# Patient Record
Sex: Female | Born: 1935 | Race: Black or African American | Hispanic: No | Marital: Single | Smoking: Former smoker
Health system: Southern US, Community
[De-identification: ages and names within clinical notes are randomized; demographics above are authoritative.]

## PROBLEM LIST (undated history)

## (undated) DIAGNOSIS — H269 Unspecified cataract: Secondary | ICD-10-CM

## (undated) DIAGNOSIS — N183 Chronic kidney disease, stage 3 unspecified: Secondary | ICD-10-CM

## (undated) DIAGNOSIS — E119 Type 2 diabetes mellitus without complications: Secondary | ICD-10-CM

## (undated) DIAGNOSIS — T8859XA Other complications of anesthesia, initial encounter: Secondary | ICD-10-CM

## (undated) DIAGNOSIS — I739 Peripheral vascular disease, unspecified: Secondary | ICD-10-CM

## (undated) DIAGNOSIS — T4145XA Adverse effect of unspecified anesthetic, initial encounter: Secondary | ICD-10-CM

## (undated) DIAGNOSIS — K589 Irritable bowel syndrome without diarrhea: Secondary | ICD-10-CM

## (undated) DIAGNOSIS — K469 Unspecified abdominal hernia without obstruction or gangrene: Secondary | ICD-10-CM

## (undated) DIAGNOSIS — M5030 Other cervical disc degeneration, unspecified cervical region: Secondary | ICD-10-CM

## (undated) DIAGNOSIS — R51 Headache: Secondary | ICD-10-CM

## (undated) DIAGNOSIS — Z86018 Personal history of other benign neoplasm: Secondary | ICD-10-CM

## (undated) DIAGNOSIS — I639 Cerebral infarction, unspecified: Secondary | ICD-10-CM

## (undated) DIAGNOSIS — R519 Headache, unspecified: Secondary | ICD-10-CM

## (undated) DIAGNOSIS — B029 Zoster without complications: Secondary | ICD-10-CM

## (undated) DIAGNOSIS — J4530 Mild persistent asthma, uncomplicated: Secondary | ICD-10-CM

## (undated) HISTORY — PX: TONSILLECTOMY: SUR1361

## (undated) HISTORY — PX: BYPASS GRAFT: SHX909

## (undated) HISTORY — PX: APPENDECTOMY: SHX54

## (undated) HISTORY — DX: Zoster without complications: B02.9

## (undated) HISTORY — DX: Headache: R51

## (undated) HISTORY — PX: PITUITARY EXCISION: SHX745

## (undated) HISTORY — DX: Peripheral vascular disease, unspecified: I73.9

## (undated) HISTORY — DX: Type 2 diabetes mellitus without complications: E11.9

## (undated) HISTORY — DX: Headache, unspecified: R51.9

---

## 2011-06-06 HISTORY — PX: ABOVE KNEE LEG AMPUTATION: SUR20

## 2014-09-16 ENCOUNTER — Encounter: Payer: Self-pay | Admitting: Neurology

## 2014-09-16 ENCOUNTER — Ambulatory Visit (INDEPENDENT_AMBULATORY_CARE_PROVIDER_SITE_OTHER): Payer: Medicare Other | Admitting: Neurology

## 2014-09-16 VITALS — BP 150/70 | HR 80 | Temp 98.3°F | Resp 20 | Ht 64.0 in | Wt 130.0 lb

## 2014-09-16 DIAGNOSIS — M79602 Pain in left arm: Secondary | ICD-10-CM | POA: Diagnosis not present

## 2014-09-16 DIAGNOSIS — E1142 Type 2 diabetes mellitus with diabetic polyneuropathy: Secondary | ICD-10-CM | POA: Diagnosis not present

## 2014-09-16 MED ORDER — GABAPENTIN 100 MG PO CAPS: ORAL_CAPSULE | ORAL | Status: DC

## 2014-09-16 NOTE — Progress Notes (Signed)
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  Records reviewed.  She is accompanied by her daughter who provides some history.  At the end of January, she felt like something bit her in the back of the neck.  Later, she began having a fluid-like sensation running down her left underarm to the elbow.  She then developed a mild nagging headache, involving either temple.  She then developed painful spasms in her underarm, as well as tingling sensation along the underarm and forearm.  There is no neck pain or pain radiating down from the neck.  The spasms are not triggered by neck movement.  It does not seem to be positional, although she notes some pain in the left elbow and some exacerbation of symptoms when leaning on the elbow.  She does not have headache at this point.  Medications are limited.  She cannot have NSAIDs.  She is very sensitive to medications.  Her Cymbalta was reduced due to hallucinations.  Tramadol is not effective and it causes some cognitive issues for her.  She takes gabapentin  twice daily.  PAST MEDICAL HISTORY: Past Medical History  Diagnosis Date  . Headache   . Diabetes mellitus without complication   . PVD (peripheral vascular disease)   . Shingles     PAST SURGICAL HISTORY: No past surgical history on file.  MEDICATIONS: No current outpatient prescriptions on file prior to visit.   No current facility-administered  medications on file prior to visit.    ALLERGIES: Allergies  Allergen Reactions  . Sulfur Anaphylaxis  . Ceclor [Cefaclor] Hives  . Plavix [Clopidogrel]   . Dilaudid [Hydromorphone Hcl] Rash  . Penicillins Rash    FAMILY HISTORY: Family History  Problem Relation Age of Onset  . Dementia    . Hypertension Daughter     SOCIAL HISTORY: History   Social History  . Marital Status: Unknown    Spouse Name: N/A  . Number of Children: N/A  . Years of Education: N/A   Occupational History  . Not on file.   Social History Main Topics  . Smoking status: Former Games developer  . Smokeless tobacco: Never Used  . Alcohol Use: No  . Drug Use: No  . Sexual Activity: No   Other Topics Concern  . Not on file   Social History Narrative  . No narrative on file    REVIEW OF SYSTEMS: Constitutional: No fevers, chills, or sweats, no generalized fatigue, change in appetite Eyes: No visual changes, double vision, eye pain Ear, nose and throat: No hearing loss, ear pain, nasal congestion, sore throat Cardiovascular: No chest pain, palpitations Respiratory:  No shortness of breath at rest or with exertion, wheezes GastrointestinaI: No nausea, vomiting, diarrhea, abdominal pain, fecal incontinence Genitourinary:  No dysuria, urinary retention or frequency Musculoskeletal:  No neck pain, back pain Integumentary: No rash, pruritus, skin lesions Neurological: as above Psychiatric: No depression, insomnia, anxiety Endocrine: No palpitations, fatigue, diaphoresis, mood swings, change in appetite, change in weight, increased thirst Hematologic/Lymphatic:  No anemia, purpura, petechiae. Allergic/Immunologic:  no itchy/runny eyes, nasal congestion, recent allergic reactions, rashes  PHYSICAL EXAM: Filed Vitals:   09/16/14 1227  BP: 150/70  Pulse: 80  Temp: 98.3 F (36.8 C)  Resp: 20   General: No acute distress Head:  Normocephalic/atraumatic Eyes:  fundi unremarkable, without vessel  changes, exudates, hemorrhages or papilledema. Neck: supple, no paraspinal tenderness, full range of motion Back: No paraspinal tenderness Heart: regular rate and rhythm Lungs: Clear to auscultation bilaterally. Vascular: No carotid bruits. Neurological Exam: Mental status: alert and oriented to person, place, and time, recent and remote memory intact, fund of knowledge intact, attention and concentration intact, speech fluent and not dysarthric, language intact. Cranial nerves: CN I: not tested CN II: pupils equal, round and reactive to light, visual fields intact, fundi unremarkable, without vessel changes, exudates, hemorrhages or papilledema. CN III, IV, VI:  full range of motion, no nystagmus, no ptosis CN V: facial sensation intact CN VII: upper and lower face symmetric CN VIII: hearing intact CN IX, X: gag intact, uvula midline CN XI: sternocleidomastoid and trapezius muscles intact CN XII: tongue midline Bulk & Tone: normal, no fasciculations.  Mild protrusion of the left shoulder blade.  Tenderness to palpation along the left underarm and forearm. Motor:  5/5 throughout Sensation:  Inconsistent reduced pinprick sensation in the fingertips of her left hand.  Reduced pinprick and vibration sensation in the left foot. Deep Tendon Reflexes:  2+ in upper extremities, absent in left lower extremity, left toes down (missing first digit) Finger to nose testing:  No dysmetria Heel to shin:  Unable to assess Gait:  Not ambulatory.  IMPRESSION: Left arm pain.  Cervical radiculopathy is possible but not definite based on clinical exam.  PLAN: Due to her age and her lack of concerning signs on exam (such as weakness), I would forego any diagnostic testing such as imaging or NCV and simply treat the pain.  Instead of adding another medication, which would likely cause drowsiness or other unwanted side effect, we will titrate gabapentin, which she already takes.  She will increase dose to  100mg  in morning and 200mg  at bedtime for 7 days, then 200mg  twice daily.  She will contact us with any questions or concerns and whether we need to continue titration.  She will follow up in 6 to 8 weeks.  45 minutes spent with patient, over 50% spent discussing diagnosis and management.  Thank you for allowing me to take part in the care of this patient.  Shon MilletAdam Sharronda Schweers, DO  CC:  Florentina JennyHenry Tripp, MD  Marlowe AschoffLatrice Polite, FNP

## 2014-09-16 NOTE — Patient Instructions (Signed)
Your strength in the arm is okay.  I would treat the pain due to the spasms in the arm.  We will increase the gabapentin 100mg  to 1 capsule in morning and 2 capsules at bedtime for 7 days, then increase to 2 capsules (200mg ) twice daily.  After a week, if pain not controlled, call us. Follow up in 6 to 8 weeks.

## 2014-09-22 ENCOUNTER — Ambulatory Visit: Payer: Self-pay | Admitting: Neurology

## 2014-10-12 ENCOUNTER — Telehealth: Payer: Self-pay | Admitting: *Deleted

## 2014-10-12 NOTE — Telephone Encounter (Signed)
Patients daughter called Charmaine Downs(Kathren) she states her mother has been very groggy with the Gabapentin is there something else she can do  Call back number [phone]

## 2014-10-12 NOTE — Telephone Encounter (Signed)
I spoke with patient daughter she is going to decrease her gabapentin  [facility] 1 [facility]  She was taking 2am and 2HS  She is just not able to stay awake during the day time . She did state the pain is so much better .

## 2014-10-19 NOTE — Telephone Encounter (Signed)
Can this be closed ?

## 2014-10-20 NOTE — Telephone Encounter (Signed)
I spoke with patient daughter she is going to decrease her gabapentin  To 1 in am and 2 at [facility]  She was taking 2am and 2HS  She is just not able to stay awake during the day time . She did state the pain is so much better .  

## 2014-11-13 ENCOUNTER — Ambulatory Visit (INDEPENDENT_AMBULATORY_CARE_PROVIDER_SITE_OTHER): Payer: Medicare Other | Admitting: Neurology

## 2014-11-13 ENCOUNTER — Encounter: Payer: Self-pay | Admitting: Neurology

## 2014-11-13 VITALS — BP 134/70 | HR 74 | Ht 64.0 in | Wt 130.0 lb

## 2014-11-13 DIAGNOSIS — G546 Phantom limb syndrome with pain: Secondary | ICD-10-CM | POA: Insufficient documentation

## 2014-11-13 DIAGNOSIS — M5010 Cervical disc disorder with radiculopathy, unspecified cervical region: Secondary | ICD-10-CM

## 2014-11-13 NOTE — Progress Notes (Signed)
NEUROLOGY FOLLOW UP OFFICE NOTE  [place] [id number]  HISTORY OF PRESENT ILLNESS: Carolyn Werner is a 81 year old right-handed woman with hyperlipidemia, hypertension, chronic kidney disease stage 3, irritable bowel syndrome,  depression, type 2 diabetes mellitus with diabetic neuropathy, right above the knee amputation with phantom limb pain, psoriasis, intermittent asthma, and osteoarthritis who follows up for left arm pain.  UPDATE: She is taking gabapentin 100mg  in morning and 200mg  at bedtime.  Taking 200mg  in the morning caused too much drowsiness.  Pain in the arm is improved but she still notes numbness.  Phantom limb pain is still an issue.  HISTORY: At the end of January, she felt like something bit her in the back of the neck.  Later, she began having a fluid-like sensation running down her left underarm to the elbow.  She then developed a mild nagging headache, involving either temple.  She then developed painful spasms in her underarm, as well as tingling sensation along the underarm and forearm.  There is no neck pain or pain radiating down from the neck.  The spasms are not triggered by neck movement.  It does not seem to be positional, although she notes some pain in the left elbow and some exacerbation of symptoms when leaning on the elbow.  She does not have headache at this point.  Medications are limited.  She cannot have NSAIDs.  She is very sensitive to medications.  Her Cymbalta was reduced due to hallucinations.  Tramadol is not effective and it causes some cognitive issues for her.  She takes gabapentin 100mg  twice daily.  PAST MEDICAL HISTORY: Past Medical History  Diagnosis Date  . Headache   . Diabetes mellitus without complication   . PVD (peripheral vascular disease)   . Shingles     MEDICATIONS: Current Outpatient Prescriptions on File Prior to Visit  Medication Sig Dispense Refill  . aspirin 325 MG tablet Take 325 mg by mouth daily.    Marland Kitchen  atenolol (TENORMIN) 25 MG tablet Take by mouth daily.    Marland Kitchen dicyclomine (BENTYL) 10 MG capsule Take 10 mg by mouth 4 (four) times daily -  before meals and at bedtime.    . docusate sodium (COLACE) 100 MG capsule Take 100 mg by mouth 2 (two) times daily.    . DULoxetine (CYMBALTA) 30 MG capsule Take 30 mg by mouth daily.    Marland Kitchen gabapentin (NEURONTIN) 100 MG capsule Increase to 1 capsule in AM and 2 capsules at bedtime x7days, then 2 capsules twice daily 120 capsule 0  . hydrALAZINE (APRESOLINE) 10 MG tablet Take 10 mg by mouth daily.    Marland Kitchen levalbuterol (XOPENEX HFA) 45 MCG/ACT inhaler Inhale 2 puffs into the lungs every 4 (four) hours as needed for wheezing.    Marland Kitchen lisinopril (PRINIVIL,ZESTRIL) 10 MG tablet Take 10 mg by mouth daily.    Marland Kitchen loratadine (CLARITIN) 10 MG tablet Take 10 mg by mouth daily.    . traMADol (ULTRAM) 50 MG tablet Take by mouth 2 (two) times daily.     No current facility-administered medications on file prior to visit.    ALLERGIES: Allergies  Allergen Reactions  . Sulfur Anaphylaxis  . Ceclor [Cefaclor] Hives  . Plavix [Clopidogrel]   . Dilaudid [Hydromorphone Hcl] Rash  . Penicillins Rash    FAMILY HISTORY: Family History  Problem Relation Age of Onset  . Dementia    . Hypertension Daughter     SOCIAL HISTORY: History   Social History  .  Marital Status: Unknown    Spouse Name: N/A  . Number of Children: N/A  . Years of Education: N/A   Occupational History  . Not on file.   Social History Main Topics  . Smoking status: Former Games developer  . Smokeless tobacco: Never Used  . Alcohol Use: No  . Drug Use: No  . Sexual Activity: No   Other Topics Concern  . Not on file   Social History Narrative    REVIEW OF SYSTEMS: Constitutional: No fevers, chills, or sweats, no generalized fatigue, change in appetite Eyes: No visual changes, double vision, eye pain Ear, nose and throat: No hearing loss, ear pain, nasal congestion, sore throat Cardiovascular: No  chest pain, palpitations Respiratory:  No shortness of breath at rest or with exertion, wheezes GastrointestinaI: No nausea, vomiting, diarrhea, abdominal pain, fecal incontinence Genitourinary:  No dysuria, urinary retention or frequency Musculoskeletal:  No neck pain, back pain Integumentary: No rash, pruritus, skin lesions Neurological: as above Psychiatric: No depression, insomnia, anxiety Endocrine: No palpitations, fatigue, diaphoresis, mood swings, change in appetite, change in weight, increased thirst Hematologic/Lymphatic:  No anemia, purpura, petechiae. Allergic/Immunologic: no itchy/runny eyes, nasal congestion, recent allergic reactions, rashes  PHYSICAL EXAM: Filed Vitals:   11/13/14 1527  BP: 134/70  Pulse: 74   General: No acute distress Head:  Normocephalic/atraumatic Eyes:  Fundoscopic exam unremarkable without vessel changes, exudates, hemorrhages or papilledema. Neck: supple, no paraspinal tenderness, full range of motion Heart:  Regular rate and rhythm Lungs:  Clear to auscultation bilaterally Back: No paraspinal tenderness Neurological Exam: alert and oriented to person, place, and time. Attention span and concentration intact, recent and remote memory intact, fund of knowledge intact.  Speech fluent and not dysarthric, language intact.  CN II-XII intact. Fundoscopic exam unremarkable without vessel changes, exudates, hemorrhages or papilledema.  Bulk and tone normal, muscle strength 5/5 throughout.  Sensation to light touch intact.  2+ in upper extremities, absent in left lower extremity.  Finger to nose and heel to shin testing intact.  Non-ambulatory.  IMPRESSION: Left arm pain.  Cervical radiculopathy is possible  Phantom limb pain  PLAN: Continue gabapentin  in morning and  at bedtime Call us if you need referral for physical therapy for phantom limb pain Follow up in 5 months  15 minutes spent face to face with patient and daughter, over 50%  spent discussing management.  Shon Millet, DO  CC: Florentina Jenny, MD Marlowe Aschoff, FNP

## 2014-11-13 NOTE — Patient Instructions (Signed)
Continue gabapentin 100mg  in morning and 200mg  at bedtime Call us if you need referral for physical therapy for phantom limb pain Follow up in 5 months

## 2014-11-16 ENCOUNTER — Telehealth: Payer: Self-pay | Admitting: Neurology

## 2014-11-16 NOTE — Telephone Encounter (Signed)
I explained to this patient I was not the nurse who worked her up on her last visit she was upset that her medications were not updated . I advised her that on her next we would do that .

## 2014-11-16 NOTE — Telephone Encounter (Signed)
Pt called in regards to her updated medication list/Dawn CB# [phone]

## 2015-04-16 ENCOUNTER — Encounter: Payer: Self-pay | Admitting: Neurology

## 2015-04-16 ENCOUNTER — Ambulatory Visit (INDEPENDENT_AMBULATORY_CARE_PROVIDER_SITE_OTHER): Payer: Medicare Other | Admitting: Neurology

## 2015-04-16 VITALS — BP 134/50 | HR 67 | Ht 64.0 in

## 2015-04-16 DIAGNOSIS — G546 Phantom limb syndrome with pain: Secondary | ICD-10-CM | POA: Diagnosis not present

## 2015-04-16 DIAGNOSIS — M5010 Cervical disc disorder with radiculopathy, unspecified cervical region: Secondary | ICD-10-CM

## 2015-04-16 NOTE — Patient Instructions (Signed)
Continue gabapentin 100mg  in morning and 200mg  at night Continue home PT/OT exercises Follow up in 6 months.

## 2015-04-16 NOTE — Progress Notes (Signed)
Chart forwarded.  

## 2015-04-16 NOTE — Progress Notes (Signed)
NEUROLOGY FOLLOW UP OFFICE NOTE  [name] [id number]  HISTORY OF PRESENT ILLNESS: Carolyn Werner is a 81 year old right-handed woman with hyperlipidemia, hypertension, chronic kidney disease stage 3, irritable bowel syndrome,  depression, type 2 diabetes mellitus with diabetic neuropathy, right above the knee amputation with phantom limb pain, psoriasis, intermittent asthma, and osteoarthritis who follows up for left arm pain.  UPDATE: She is taking gabapentin 100mg  in morning and 200mg  at bedtime.  She just finished PT/OT which has helped with the arm pain.  Now, she really only notices some numbness in the arm.  The phantom limb pain is still an issue but she learned exercises for that too.  HISTORY: At the end of January, she felt like something bit her in the back of the neck.  Later, she began having a fluid-like sensation running down her left underarm to the elbow.  She then developed a mild nagging headache, involving either temple.  She then developed painful spasms in her underarm, as well as tingling sensation along the underarm and forearm.  There is no neck pain or pain radiating down from the neck.  The spasms are not triggered by neck movement.  It does not seem to be positional, although she notes some pain in the left elbow and some exacerbation of symptoms when leaning on the elbow.  She does not have headache at this point.    Medications are limited.  She cannot have NSAIDs.  She is very sensitive to medications.  Her Cymbalta was reduced due to hallucinations.  Tramadol is not effective and it causes some cognitive issues for her.    PAST MEDICAL HISTORY: Past Medical History  Diagnosis Date  . Headache   . Diabetes mellitus without complication ([name])   . PVD (peripheral vascular disease) ([name])   . Shingles     MEDICATIONS: Current Outpatient Prescriptions on File Prior to Visit  Medication Sig Dispense Refill  . aspirin 325 MG tablet Take 325 mg by  mouth daily.    Marland Kitchen. docusate sodium (COLACE) 100 MG capsule Take 100 mg by mouth 2 (two) times daily.    . DULoxetine (CYMBALTA) 30 MG capsule Take 30 mg by mouth daily.    Marland Kitchen. gabapentin (NEURONTIN) 100 MG capsule Increase to 1 capsule in AM and 2 capsules at bedtime x7days, then 2 capsules twice daily 120 capsule 0  . levalbuterol (XOPENEX HFA) 45 MCG/ACT inhaler Inhale 2 puffs into the lungs every 4 (four) hours as needed for wheezing.    Marland Kitchen. loratadine (CLARITIN) 10 MG tablet Take 10 mg by mouth daily.    . traMADol (ULTRAM) 50 MG tablet Take by mouth 2 (two) times daily.     No current facility-administered medications on file prior to visit.    ALLERGIES: Allergies  Allergen Reactions  . Sulfur Anaphylaxis  . Ceclor [Cefaclor] Hives  . Plavix [Clopidogrel]   . Dilaudid [Hydromorphone Hcl] Rash  . Penicillins Rash    FAMILY HISTORY: Family History  Problem Relation Age of Onset  . Dementia    . Hypertension Daughter     SOCIAL HISTORY: Social History   Social History  . Marital Status: Unknown    Spouse Name: N/A  . Number of Children: N/A  . Years of Education: N/A   Occupational History  . Not on file.   Social History Main Topics  . Smoking status: Former Games developermoker  . Smokeless tobacco: Never Used  . Alcohol Use: No  . Drug Use: No  .  Sexual Activity: No   Other Topics Concern  . Not on file   Social History Narrative    REVIEW OF SYSTEMS: Constitutional: No fevers, chills, or sweats, no generalized fatigue, change in appetite Eyes: No visual changes, double vision, eye pain Ear, nose and throat: No hearing loss, ear pain, nasal congestion, sore throat Cardiovascular: No chest pain, palpitations Respiratory:  No shortness of breath at rest or with exertion, wheezes GastrointestinaI: No nausea, vomiting, diarrhea, abdominal pain, fecal incontinence Genitourinary:  No dysuria, urinary retention or frequency Musculoskeletal:  No neck pain, back  pain Integumentary: No rash, pruritus, skin lesions Neurological: as above Psychiatric: No depression, insomnia, anxiety Endocrine: No palpitations, fatigue, diaphoresis, mood swings, change in appetite, change in weight, increased thirst Hematologic/Lymphatic:  No anemia, purpura, petechiae. Allergic/Immunologic: no itchy/runny eyes, nasal congestion, recent allergic reactions, rashes  PHYSICAL EXAM: Filed Vitals:   04/16/15 1413  BP: 134/50  Pulse: 67   General: No acute distress.  Patient appears well-groomed.   Head:  Normocephalic/atraumatic Eyes:  Fundoscopic exam unremarkable without vessel changes, exudates, hemorrhages or papilledema. Neck: supple, no paraspinal tenderness, full range of motion Heart:  Regular rate and rhythm Lungs:  Clear to auscultation bilaterally Back: No paraspinal tenderness Neurological Exam: alert and oriented to person, place, and time. Attention span and concentration intact, recent and remote memory intact, fund of knowledge intact.  Speech fluent and not dysarthric, language intact.  CN II-XII intact. Fundoscopic exam unremarkable without vessel changes, exudates, hemorrhages or papilledema.  Bulk and tone normal, muscle strength 5/5 throughout.  Sensation to light touch intact.  2+ in upper extremities, absent in left lower extremity.  Finger to nose and heel to shin testing intact.  Non-ambulatory.  IMPRESSION: Left arm pain.  Cervical radiculopathy.  Improved Phantom limb pain  PLAN: Continue gabapentin  in AM and  in PM and Cymbalta  Continue home PT/OT exercises Follow up in 6 months.  15 minutes spent face to face with patient, over 50% spent discussing management.   Shon Millet, DO  CC:  Lynn Ito

## 2015-10-15 ENCOUNTER — Ambulatory Visit: Payer: Federal, State, Local not specified - PPO | Admitting: Neurology

## 2016-01-19 ENCOUNTER — Ambulatory Visit: Payer: Federal, State, Local not specified - PPO | Admitting: Neurology

## 2016-07-03 ENCOUNTER — Encounter (HOSPITAL_COMMUNITY): Payer: Self-pay | Admitting: Pharmacy Technician

## 2016-07-03 ENCOUNTER — Emergency Department (HOSPITAL_COMMUNITY)
Admission: EM | Admit: 2016-07-03 | Discharge: 2016-07-03 | Disposition: A | Discharge status: Home / Self Care | Payer: Medicare Other | Attending: Emergency Medicine | Admitting: Emergency Medicine

## 2016-07-03 ENCOUNTER — Emergency Department (HOSPITAL_COMMUNITY): Payer: Medicare Other

## 2016-07-03 DIAGNOSIS — Z87891 Personal history of nicotine dependence: Secondary | ICD-10-CM | POA: Insufficient documentation

## 2016-07-03 DIAGNOSIS — E119 Type 2 diabetes mellitus without complications: Secondary | ICD-10-CM | POA: Insufficient documentation

## 2016-07-03 DIAGNOSIS — Z79899 Other long term (current) drug therapy: Secondary | ICD-10-CM | POA: Insufficient documentation

## 2016-07-03 DIAGNOSIS — R42 Dizziness and giddiness: Secondary | ICD-10-CM | POA: Diagnosis not present

## 2016-07-03 DIAGNOSIS — Z7982 Long term (current) use of aspirin: Secondary | ICD-10-CM | POA: Insufficient documentation

## 2016-07-03 DIAGNOSIS — R55 Syncope and collapse: Secondary | ICD-10-CM | POA: Diagnosis present

## 2016-07-03 LAB — BASIC METABOLIC PANEL
Anion gap: 9 (ref 5–15)
BUN: 14 mg/dL (ref 6–20)
CHLORIDE: 101 mmol/L (ref 101–111)
CO2: 26 mmol/L (ref 22–32)
CREATININE: 1.16 mg/dL — AB (ref 0.44–1.00)
Calcium: 10.1 mg/dL (ref 8.9–10.3)
GFR calc Af Amer: 46 mL/min — ABNORMAL LOW (ref >60)
GFR calc non Af Amer: 40 mL/min — ABNORMAL LOW (ref >60)
Glucose, Bld: 174 mg/dL — ABNORMAL HIGH (ref 65–99)
POTASSIUM: 3.8 mmol/L (ref 3.5–5.1)
SODIUM: 136 mmol/L (ref 135–145)

## 2016-07-03 LAB — URINALYSIS, ROUTINE W REFLEX MICROSCOPIC
BACTERIA UA: NONE SEEN
Bilirubin Urine: NEGATIVE
Glucose, UA: NEGATIVE mg/dL
Hgb urine dipstick: NEGATIVE
Ketones, ur: NEGATIVE mg/dL
Nitrite: NEGATIVE
PROTEIN: NEGATIVE mg/dL
SPECIFIC GRAVITY, URINE: 1.009 (ref 1.005–1.030)
SQUAMOUS EPITHELIAL / LPF: NONE SEEN
pH: 5 (ref 5.0–8.0)

## 2016-07-03 LAB — CBC WITH DIFFERENTIAL/PLATELET
BASOS ABS: 0 10*3/uL (ref 0.0–0.1)
Basophils Relative: 0 %
EOS PCT: 1 %
Eosinophils Absolute: 0.1 10*3/uL (ref 0.0–0.7)
HEMATOCRIT: 39 % (ref 36.0–46.0)
Hemoglobin: 13 g/dL (ref 12.0–15.0)
LYMPHS PCT: 28 %
Lymphs Abs: 1.8 10*3/uL (ref 0.7–4.0)
MCH: 29.2 pg (ref 26.0–34.0)
MCHC: 33.3 g/dL (ref 30.0–36.0)
MCV: 87.6 fL (ref 78.0–100.0)
MONOS PCT: 5 %
Monocytes Absolute: 0.4 10*3/uL (ref 0.1–1.0)
NEUTROS ABS: 4.2 10*3/uL (ref 1.7–7.7)
Neutrophils Relative %: 66 %
PLATELETS: 145 10*3/uL — AB (ref 150–400)
RBC: 4.45 MIL/uL (ref 3.87–5.11)
RDW: 12.4 % (ref 11.5–15.5)
WBC: 6.5 10*3/uL (ref 4.0–10.5)

## 2016-07-03 LAB — I-STAT TROPONIN, ED: Troponin i, poc: 0.02 ng/mL (ref 0.00–0.08)

## 2016-07-03 NOTE — ED Provider Notes (Signed)
[facility]PT Provider Note   CSN: [id number] Arrival date & time: 07/03/16  1602     History   Chief Complaint Chief Complaint  Patient presents with  . Near Syncope    HPI Carolyn Werner is a 83 y.o. female.  83 year old African-American female with a past medical history significant for hyperlipidemia, hypertension, CK D stage III, IBS, depression, diabetes, right AKA, asthma, PAD that presents to the ED today with complaint of near syncope. Patient states that she was washing when she became lightheaded and felt like the room was spining. The patient was able to sit down. She denies LOC. She denies any pain at the time. The patient states that she has had vertigo in the past and this felt different. Patient uses a wheelchair at home. She lives with her daughter. Patient also reports having palpitations that of an ongoing for the past 2 years. Last episode of palpitations was last week. She reports associated nausea with the episode. She reported substernal chest "burning" with the episodes of palpitations. She denies any shortness of breath or diaphoresis with the episode. The pain self resolved. She denies any pain today. Patient does report a mild left-sided headache. She denies any vision changes. Patient states she has been seen by cardiologist for palpitations. Patient denies any dizziness or lightheadedness at this time. States that her symptoms have resolved. She denies any fever, chills, headache, vision changes, chest pain, shortness breath, abdominal pain, nausea, emesis, urinary symptoms, paresthesias.          Past Medical History:  Diagnosis Date  . Diabetes mellitus without complication ([name])   . Headache   . PVD (peripheral vascular disease) ([name])   . Shingles     Patient Active Problem List   Diagnosis Date Noted  . Phantom limb pain ([name]) 11/13/2014  . Cervical disc disorder with radiculopathy of cervical region 11/13/2014    History reviewed.  No pertinent surgical history.  OB History    No data available       Home Medications    Prior to Admission medications   Medication Sig Start Date End Date Taking? Authorizing Provider  amLODipine (NORVASC) 2.5 MG tablet Take 2.5 mg by mouth daily.    Historical Provider, MD  aspirin 325 MG tablet Take 325 mg by mouth daily.    Historical Provider, MD  docusate sodium (COLACE) 100 MG capsule Take 100 mg by mouth 2 (two) times daily.    Historical Provider, MD  DULoxetine (CYMBALTA) 30 MG capsule Take 30 mg by mouth daily.    Historical Provider, MD  gabapentin (NEURONTIN) 100 MG capsule Increase to 1 capsule in AM and 2 capsules at bedtime x7days, then 2 capsules twice daily 09/16/14   Drema Dallas, DO  levalbuterol [facility] HFA) 45 MCG/ACT inhaler Inhale 2 puffs into the lungs every 4 (four) hours as needed for wheezing.    Historical Provider, MD  loratadine (CLARITIN) 10 MG tablet Take 10 mg by mouth daily.    Historical Provider, MD  metoprolol tartrate (LOPRESSOR) 25 MG tablet Take 25 mg by mouth 2 (two) times daily.    Historical Provider, MD  traMADol (ULTRAM) 50 MG tablet Take by mouth 2 (two) times daily.    Historical Provider, MD    Family History Family History  Problem Relation Age of Onset  . Dementia    . Hypertension Daughter     Social History Social History  Substance Use Topics  . Smoking status: Former Games developer  .  Smokeless tobacco: Never Used  . Alcohol use No     Allergies   Sulfur; Ceclor [cefaclor]; Plavix [clopidogrel]; Dilaudid [hydromorphone hcl]; and Penicillins   Review of Systems Review of Systems  Constitutional: Negative for chills and fever.  HENT: Negative for congestion.   Eyes: Negative for visual disturbance.  Respiratory: Negative for cough and shortness of breath.   Cardiovascular: Negative for chest pain and palpitations.  Gastrointestinal: Negative for diarrhea, nausea and vomiting.  Genitourinary: Negative for dysuria,  frequency, hematuria and urgency.  Musculoskeletal: Negative for back pain.  Skin: Negative.   Neurological: Positive for dizziness, weakness, light-headedness and headaches.  All other systems reviewed and are negative.    Physical Exam Updated Vital Signs There were no vitals taken for this visit.  Physical Exam  Constitutional: She is oriented to person, place, and time. She appears well-developed and well-nourished. No distress.  HENT:  Head: Normocephalic and atraumatic.  Right Ear: Tympanic membrane, external ear and ear canal normal.  Left Ear: Tympanic membrane, external ear and ear canal normal.  Nose: Nose normal.  Mouth/Throat: Uvula is midline and oropharynx is clear and moist.  Eyes: Conjunctivae and EOM are normal. Pupils are equal, round, and reactive to light. Right eye exhibits no discharge. Left eye exhibits no discharge. No scleral icterus.  Neck: Normal range of motion. Neck supple. No thyromegaly present.  Cardiovascular: Normal rate, regular rhythm, normal heart sounds and intact distal pulses.  Exam reveals no gallop and no friction rub.   No murmur heard. Pulmonary/Chest: Effort normal and breath sounds normal. No respiratory distress. She has no wheezes. She exhibits no tenderness.  Abdominal: Soft. Bowel sounds are normal. She exhibits no distension. There is no tenderness. There is no rebound and no guarding.  Musculoskeletal: Normal range of motion.  Patient with above-the-knee amputation on the right. Pulses are 2+ and all x-rays bilaterally.  Lymphadenopathy:    She has no cervical adenopathy.  Neurological: She is alert and oriented to person, place, and time.  The patient is alert, attentive, and oriented x 3. Speech is clear. Cranial nerve II-VII grossly intact. Negative pronator drift. Sensation intact. Strength 5/5 in all extremities. Reflexes 2+ and symmetric at biceps, triceps, knees, and ankles. Rapid alternating movement and fine finger movements  intact. Did not assess gait or Romberg due to weakness.  Skin: Skin is warm and dry. Capillary refill takes less than 2 seconds.  Nursing note and vitals reviewed.    ED Treatments / Results  Labs (all labs ordered are listed, but only abnormal results are displayed) Labs Reviewed  BASIC METABOLIC PANEL - Abnormal; Notable for the following:       Result Value   Glucose, Bld 174 (*)    Creatinine, Ser 1.16 (*)    GFR calc non Af Amer 40 (*)    GFR calc Af Amer 46 (*)    All other components within normal limits  CBC WITH DIFFERENTIAL/PLATELET - Abnormal; Notable for the following:    Platelets 145 (*)    All other components within normal limits  URINALYSIS, ROUTINE W REFLEX MICROSCOPIC - Abnormal; Notable for the following:    Leukocytes, UA MODERATE (*)    All other components within normal limits  I-STAT TROPOININ, ED    EKG  EKG Interpretation  Date/Time:  Monday July 03 2016 17:08:07 EST Ventricular Rate:  64 PR Interval:    QRS Duration: 97 QT Interval:  423 QTC Calculation: 437 R Axis:   13 Text  Interpretation:  Sinus rhythm Abnormal R-wave progression, early transition Nonspecific T abnormalities, inferior leads No significant change since last tracing Confirmed by Ethelda ChickJACUBOWITZ  MD, SAM [phone]([id number]) on 07/03/2016 5:21:08 PM       Radiology Dg Chest 2 View  Result Date: 07/03/2016 CLINICAL DATA:  Acute onset of vertigo and generalized weakness. Initial encounter. EXAM: CHEST  2 VIEW COMPARISON:  None. FINDINGS: The lungs are well-aerated and clear. There is no evidence of focal opacification, pleural effusion or pneumothorax. The heart is normal in size; the mediastinal contour is within normal limits. No acute osseous abnormalities are seen. IMPRESSION: No acute cardiopulmonary process seen. Electronically Signed   By: Roanna RaiderJeffery  Chang M.D.   On: 07/03/2016 19:32    Procedures Procedures (including critical care time)  Medications Ordered in ED Medications - No  data to display   Initial Impression / Assessment and Plan / ED Course  I have reviewed the triage vital signs and the nursing notes.  Pertinent labs & imaging results that were available during my care of the patient were reviewed by me and considered in my medical decision making (see chart for details).     Patient presents to the ED with complaint of dizziness and feeling generally weak onset prior to arrival. Patient denies any syncope. Denies any LOC. States her symptoms have completely resolved. Patient describes the dizziness as the sensation of the room was spinning. Sitting down resolved her symptoms. She has no focal neuro deficits. Patient does report palpitations that have been ongoing. Has been seen by cardiologist. Denies any chest pain or shortness of breath episode today. Chest x-ray reveals no acute abnormalities. No focal infiltrate. Troponin is negative. EKG without any acute changes. Labs are reassuring. No signs of urinary tract infection. Patient continues to be asymptomatic. She does not appear to be dehydrated. Spoke with tried hospitalist. Felt the patient needed admission for observation to rule out near-syncope. Hospitalist felt that patient would not benefit from admission to the hospital. All of her lab work has been reassuring. Felt she could be discharged home. Symptoms are likely consistent with vertigo. She's been symptomatically free in the ED. No focal neuro deficits concerning for CVA. Spoke with Dr. Rennis ChrisJacobowitz who evaluated patient and agrees with the above plan. Patient vital signs are stable. She is nontoxic appearing. She is in no acute distress. Patient lives at home with her daughter. Ambulate with wheelchair. Encouraged follow-up with her primary care doctor. Daughter is agreeable to the above plan. Pt is hemodynamically stable, in NAD, & able to ambulate in the ED. Pain has been managed & has no complaints prior to dc. Pt is comfortable with above plan and is  stable for discharge at this time. All questions were answered prior to disposition. Strict return precautions for f/u to the ED were discussed.   Final Clinical Impressions(s) / ED Diagnoses   Final diagnoses:  Dizziness    New Prescriptions New Prescriptions   No medications on file     Rise MuKenneth T Leaphart, PA-C 07/05/16 0207    Doug SouSam Jacubowitz, MD 07/06/16 [phone]

## 2016-07-03 NOTE — ED Provider Notes (Signed)
Patient reports she felt room spinning and generally weak earlier today which has since resolved. She is presently asymptomatic on exam she is awake alert Glasgow Coma Score 15 creatinine is 2 through 12 grossly intact. No distress   Doug SouSam Fabiha Rougeau, MD 07/03/16 [phone]

## 2016-07-03 NOTE — ED Notes (Signed)
ED Provider at bedside. 

## 2016-07-03 NOTE — ED Triage Notes (Signed)
Pt arrives to the ED via [facility] with reports of near syncope. Per EMS pt was washing dishes and became dizzy and lightheaded. Pt was able to sit down. Pt did not lose consciousness. Pt also c/o palpitations and nausea X 2days managed by pcp. VSS with EMS.

## 2016-07-03 NOTE — Discharge Instructions (Signed)
All of your workup has been normal today. This could be possible vertigo. Please follow-up with her primary care doctor. I'm also giving a referral to the cardiologist as you asked for. Please return to the ED if your symptoms worsen.

## 2016-07-05 ENCOUNTER — Ambulatory Visit (INDEPENDENT_AMBULATORY_CARE_PROVIDER_SITE_OTHER): Payer: Medicare Other | Admitting: Cardiology

## 2016-07-05 ENCOUNTER — Encounter: Payer: Self-pay | Admitting: Cardiology

## 2016-07-05 VITALS — BP 134/68 | HR 72 | Ht 63.5 in

## 2016-07-05 DIAGNOSIS — R002 Palpitations: Secondary | ICD-10-CM | POA: Diagnosis not present

## 2016-07-05 DIAGNOSIS — R011 Cardiac murmur, unspecified: Secondary | ICD-10-CM | POA: Diagnosis not present

## 2016-07-05 MED ORDER — METOPROLOL TARTRATE 25 MG PO TABS: 25.0000 mg | ORAL_TABLET | Freq: Two times a day (BID) | ORAL | 4 refills | Status: DC

## 2016-07-05 NOTE — Patient Instructions (Addendum)
Medication Instructions:  INCREASE Metoprolol 25mg  to Twice a day   Labwork: None   Testing/Procedures: Your physician has requested that you have an echocardiogram. Echocardiography is a painless test that uses sound waves to create images of your heart. It provides your doctor with information about the size and shape of your heart and how well your heart's chambers and valves are working. This procedure takes approximately one hour. There are no restrictions for this procedure.  Your physician has recommended that you wear an 30 day event monitor. Event monitors are medical devices that record the heart's electrical activity. Doctors most often us these monitors to diagnose arrhythmias. Arrhythmias are problems with the speed or rhythm of the heartbeat. The monitor is a small, portable device. You can wear one while you do your normal daily activities. This is usually used to diagnose what is causing palpitations/syncope (passing out).  BOTH WILL BE COMPLETE AT [address]  Follow-Up: Your physician recommends that you schedule a follow-up appointment in: 1 [facility].  Any Other Special Instructions Will Be Listed Below (If Applicable).   If you need a refill on your cardiac medications before your next appointment, please call your pharmacy.

## 2016-07-05 NOTE — Progress Notes (Signed)
07/05/2016 Zane HeraldJuantia Kloss   07/20/1921  [id number]  Primary Physician Lynn ItoSmith, Lori, MD Primary Cardiologist: New (Dr. Duke Salviaandolph, DOD)   Reason for Visit/CC: Palpitations   HPI:  [name] is a 83 y/o AAF, retired Doctor, general practicemilitary RN, who presents to clinic today as a new patient. [facility] given development of paroxsymal  tachy palpitations. She has no significant prior cardiac history. She reports that she had a LCH while living in [place], almost 10 years ago, due to an abnormal ETT that was reportedly normal. She does have cardiac risk factors including DM, HTN and stage 3 CKD. She also has PVD, s/p right AKA in 2013. She is now wheel chair bound but looks very well for her age. Good cognitive function. She lives with her daughter. She is a little hard of hearing and wears two hearing aids. [facility] [place] [facility] [place].   She notes increased recurrence of palpitations over the last 2 months. Occurs at random. Episodes last up to 5 min at a time. Feels fast and irregular. She reports associated chest discomfort that feels like a burning in her chest when this occurs, but she denies any CP in the absence of palpitations. She sometimes feels short of breath when this happens but she denies syncope/ near syncope. She was drinking regular coffee each morning but has recently changed to decaf. She is on Lopressor, but only taking this once daily.   [facility] for dizziness, but denied any symptoms of palpitations or chest pain. W/u in the ED was normal, including vital signs, EKG and laboratory work. CBC and CMP was normal. It was felt that her symptoms were related to vertigo. She was however advised to f/u in our office given her recent palpitations.   She is asymptomatic in clinic today. Exam is benign except for a slight cardiac murmur at RUSB. RRR. BP is stable. She comes to clinic [facility]. TSH was WNL.     Current Meds  Medication Sig  . amLODipine (NORVASC) 2.5 MG tablet Take 2.5 mg by mouth every evening.   Marland Kitchen. aspirin 325 MG tablet Take 325 mg by mouth daily.  . Biotin 5 MG TABS Take 5 mg by mouth every evening.   . budesonide (PULMICORT) 0.25 MG/2ML nebulizer solution Take 0.25 mg by nebulization 2 (two) times daily.  . Cholecalciferol (VITAMIN D) 2000 units CAPS Take 2,000 Units by mouth daily.  Marland Kitchen. docusate sodium (COLACE) 100 MG capsule Take 200 mg by mouth at bedtime.   . DULoxetine (CYMBALTA) 30 MG capsule Take 30 mg by mouth daily.  . Flaxseed, Linseed, (FLAXSEED OIL) 1000 MG CAPS Take 1,000 mg by mouth 3 (three) times daily.  . fluticasone (FLONASE) 50 MCG/ACT nasal spray Place 1 spray into both nostrils daily as needed for allergies or rhinitis.  Marland Kitchen. gabapentin (NEURONTIN) 100 MG capsule Increase to 1 capsule in AM and 2 capsules at bedtime x7days, then 2 capsules twice daily (Patient taking differently: Take 100-200 mg by mouth See admin instructions. Takes 1 capsule in AM and 2 capsules at bedtime)  . latanoprost (XALATAN) 0.005 % ophthalmic solution Place 1 drop into both eyes at bedtime.  . levalbuterol (XOPENEX HFA) 45 MCG/ACT inhaler Inhale 2 puffs into the lungs every 4 (four) hours as needed for wheezing.  Marland Kitchen. loratadine (CLARITIN) 10 MG tablet Take 10 mg by mouth  daily as needed for allergies.  . metoprolol tartrate (LOPRESSOR) 25 MG tablet Take 1 tablet (25 mg total) by mouth 2 (two) times daily.  . Multiple Vitamin (MULTIVITAMIN WITH MINERALS) TABS tablet Take 1 tablet by mouth daily.  Marland Kitchen nystatin ointment (MYCOSTATIN) Apply 1 application topically daily as needed (for irritation).  Bertram Gala Glycol-Propyl Glycol (SYSTANE) 0.4-0.3 % SOLN Apply 1 drop to eye 2 (two) times daily as needed (for dry eyes).  . sodium chloride (OCEAN) 0.65 % SOLN nasal spray Place 1 spray into both nostrils daily as needed for congestion.  . traMADol (ULTRAM) 50 MG tablet  Take 50 mg by mouth every 12 (twelve) hours as needed for moderate pain.   . [DISCONTINUED] metoprolol tartrate (LOPRESSOR) 25 MG tablet Take 25 mg by mouth daily.    Allergies  Allergen Reactions  . Sulfa Antibiotics Anaphylaxis  . Ceclor [Cefaclor] Hives  . Dilaudid [Hydromorphone Hcl] Rash  . Penicillins Rash  . Plavix [Clopidogrel] Other (See Comments)   Past Medical History:  Diagnosis Date  . Diabetes mellitus without complication ([name])   . Headache   . PVD (peripheral vascular disease) ([name])   . Shingles    Family History  Problem Relation Age of Onset  . Dementia    . Hypertension Daughter    No past surgical history on file. Social History   Social History  . Marital status: Unknown    Spouse name: N/A  . Number of children: N/A  . Years of education: N/A   Occupational History  . Not on file.   Social History Main Topics  . Smoking status: Former Games developer  . Smokeless tobacco: Never Used  . Alcohol use No  . Drug use: No  . Sexual activity: No   Other Topics Concern  . Not on file   Social History Narrative  . No narrative on file     Review of Systems: General: negative for chills, fever, night sweats or weight changes.  Cardiovascular: negative for chest pain, dyspnea on exertion, edema, orthopnea, palpitations, paroxysmal nocturnal dyspnea or shortness of breath Dermatological: negative for rash Respiratory: negative for cough or wheezing Urologic: negative for hematuria Abdominal: negative for nausea, vomiting, diarrhea, bright red blood per rectum, melena, or hematemesis Neurologic: negative for visual changes, syncope, or dizziness All other systems reviewed and are otherwise negative except as noted above.   Physical Exam:  Blood pressure 134/68, pulse 72, height 5' 3.5" (1.613 m).  General appearance: alert, cooperative and no distress Neck: no carotid bruit and no JVD Lungs: clear to auscultation bilaterally Heart: regular rate and  rhythm and 1/6 systolic, high pitched murmur at RUSB Extremities: s/p right AKA, no edema on the left  Pulses: 2+ and symmetric Skin: Skin color, texture, turgor normal. No rashes or lesions Neurologic: Grossly normal  EKG NSR. 07/03/16  ASSESSMENT AND PLAN:   1. Palpitations: currently asymptomatic in clinic today. Physical exam reveals RRR. Recent labs at [facility] 1/29 showed normal K and renal function, normal CBC. UA also normal. TSH was recently checked at the [facility] 05/2016 and was WNL. We will order a 30 day monitor to r/o afib and other worrisome arrhthymias. We will also increase the frequency of her metoprolol. She is on the short acting, Lopressor, but only taking once daily. We will increase to BID. We will also check a 2D echo to screen for structural abnormalities.   2. Cardiac Murmur: 1/6 high pitched systolic murmur noted along RUSB. We will obtain a  2D echo to further investigate.   This patient case was discussed with Dr. Duke Salvia, DOD, who agrees with the above plan.   PLAN  F/u with Dr. Duke Salvia, in 4 weeks, after monitor.   Robbie Lis PA-C 07/05/2016 3:39 PM

## 2016-07-13 ENCOUNTER — Ambulatory Visit (INDEPENDENT_AMBULATORY_CARE_PROVIDER_SITE_OTHER): Payer: Medicare Other

## 2016-07-13 DIAGNOSIS — R002 Palpitations: Secondary | ICD-10-CM | POA: Diagnosis not present

## 2016-07-13 DIAGNOSIS — R011 Cardiac murmur, unspecified: Secondary | ICD-10-CM | POA: Diagnosis not present

## 2016-07-19 ENCOUNTER — Ambulatory Visit (HOSPITAL_COMMUNITY): Payer: Federal, State, Local not specified - PPO

## 2016-07-27 ENCOUNTER — Encounter (HOSPITAL_COMMUNITY): Payer: Self-pay

## 2016-07-27 ENCOUNTER — Emergency Department (HOSPITAL_COMMUNITY): Payer: Medicare Other

## 2016-07-27 ENCOUNTER — Observation Stay (HOSPITAL_COMMUNITY): Payer: Medicare Other

## 2016-07-27 ENCOUNTER — Inpatient Hospital Stay (HOSPITAL_COMMUNITY)
Admission: EM | Admit: 2016-07-27 | Discharge: 2016-07-31 | DRG: 066 | Disposition: A | Discharge status: Skilled Nursing Facility | Payer: Medicare Other | Attending: Oncology | Admitting: Oncology

## 2016-07-27 DIAGNOSIS — M792 Neuralgia and neuritis, unspecified: Secondary | ICD-10-CM

## 2016-07-27 DIAGNOSIS — Z79899 Other long term (current) drug therapy: Secondary | ICD-10-CM

## 2016-07-27 DIAGNOSIS — E785 Hyperlipidemia, unspecified: Secondary | ICD-10-CM | POA: Diagnosis present

## 2016-07-27 DIAGNOSIS — R29898 Other symptoms and signs involving the musculoskeletal system: Secondary | ICD-10-CM

## 2016-07-27 DIAGNOSIS — R200 Anesthesia of skin: Secondary | ICD-10-CM

## 2016-07-27 DIAGNOSIS — R471 Dysarthria and anarthria: Secondary | ICD-10-CM | POA: Diagnosis not present

## 2016-07-27 DIAGNOSIS — F32A Depression, unspecified: Secondary | ICD-10-CM

## 2016-07-27 DIAGNOSIS — I639 Cerebral infarction, unspecified: Secondary | ICD-10-CM | POA: Diagnosis not present

## 2016-07-27 DIAGNOSIS — R1310 Dysphagia, unspecified: Secondary | ICD-10-CM | POA: Diagnosis present

## 2016-07-27 DIAGNOSIS — R4702 Dysphasia: Secondary | ICD-10-CM

## 2016-07-27 DIAGNOSIS — E1142 Type 2 diabetes mellitus with diabetic polyneuropathy: Secondary | ICD-10-CM | POA: Diagnosis present

## 2016-07-27 DIAGNOSIS — F329 Major depressive disorder, single episode, unspecified: Secondary | ICD-10-CM

## 2016-07-27 DIAGNOSIS — N183 Chronic kidney disease, stage 3 unspecified: Secondary | ICD-10-CM

## 2016-07-27 DIAGNOSIS — Z89412 Acquired absence of left great toe: Secondary | ICD-10-CM

## 2016-07-27 DIAGNOSIS — Z89611 Acquired absence of right leg above knee: Secondary | ICD-10-CM

## 2016-07-27 DIAGNOSIS — I63212 Cerebral infarction due to unspecified occlusion or stenosis of left vertebral arteries: Principal | ICD-10-CM

## 2016-07-27 DIAGNOSIS — R4701 Aphasia: Secondary | ICD-10-CM | POA: Diagnosis present

## 2016-07-27 DIAGNOSIS — E1122 Type 2 diabetes mellitus with diabetic chronic kidney disease: Secondary | ICD-10-CM | POA: Diagnosis present

## 2016-07-27 DIAGNOSIS — I951 Orthostatic hypotension: Secondary | ICD-10-CM | POA: Diagnosis present

## 2016-07-27 DIAGNOSIS — H409 Unspecified glaucoma: Secondary | ICD-10-CM

## 2016-07-27 DIAGNOSIS — Z87891 Personal history of nicotine dependence: Secondary | ICD-10-CM

## 2016-07-27 DIAGNOSIS — I129 Hypertensive chronic kidney disease with stage 1 through stage 4 chronic kidney disease, or unspecified chronic kidney disease: Secondary | ICD-10-CM | POA: Diagnosis present

## 2016-07-27 DIAGNOSIS — I6350 Cerebral infarction due to unspecified occlusion or stenosis of unspecified cerebral artery: Secondary | ICD-10-CM | POA: Diagnosis present

## 2016-07-27 DIAGNOSIS — R4781 Slurred speech: Secondary | ICD-10-CM | POA: Diagnosis present

## 2016-07-27 DIAGNOSIS — I10 Essential (primary) hypertension: Secondary | ICD-10-CM

## 2016-07-27 DIAGNOSIS — E119 Type 2 diabetes mellitus without complications: Secondary | ICD-10-CM

## 2016-07-27 DIAGNOSIS — Z7982 Long term (current) use of aspirin: Secondary | ICD-10-CM

## 2016-07-27 DIAGNOSIS — Z888 Allergy status to other drugs, medicaments and biological substances status: Secondary | ICD-10-CM

## 2016-07-27 DIAGNOSIS — E1151 Type 2 diabetes mellitus with diabetic peripheral angiopathy without gangrene: Secondary | ICD-10-CM | POA: Diagnosis present

## 2016-07-27 DIAGNOSIS — J45909 Unspecified asthma, uncomplicated: Secondary | ICD-10-CM | POA: Diagnosis present

## 2016-07-27 DIAGNOSIS — I6522 Occlusion and stenosis of left carotid artery: Secondary | ICD-10-CM | POA: Diagnosis present

## 2016-07-27 HISTORY — DX: Personal history of other benign neoplasm: Z86.018

## 2016-07-27 LAB — URINALYSIS, ROUTINE W REFLEX MICROSCOPIC
BACTERIA UA: NONE SEEN
BILIRUBIN URINE: NEGATIVE
Glucose, UA: NEGATIVE mg/dL
Hgb urine dipstick: NEGATIVE
Ketones, ur: NEGATIVE mg/dL
Nitrite: NEGATIVE
Protein, ur: NEGATIVE mg/dL
SPECIFIC GRAVITY, URINE: 1.01 (ref 1.005–1.030)
pH: 6 (ref 5.0–8.0)

## 2016-07-27 LAB — CBC WITH DIFFERENTIAL/PLATELET
Basophils Absolute: 0 10*3/uL (ref 0.0–0.1)
Basophils Relative: 1 %
EOS ABS: 0.1 10*3/uL (ref 0.0–0.7)
EOS PCT: 2 %
HCT: 38.1 % (ref 36.0–46.0)
Hemoglobin: 12.4 g/dL (ref 12.0–15.0)
LYMPHS ABS: 1.6 10*3/uL (ref 0.7–4.0)
LYMPHS PCT: 25 %
MCH: 28.6 pg (ref 26.0–34.0)
MCHC: 32.5 g/dL (ref 30.0–36.0)
MCV: 87.8 fL (ref 78.0–100.0)
MONO ABS: 0.3 10*3/uL (ref 0.1–1.0)
Monocytes Relative: 5 %
Neutro Abs: 4.2 10*3/uL (ref 1.7–7.7)
Neutrophils Relative %: 67 %
PLATELETS: 139 10*3/uL — AB (ref 150–400)
RBC: 4.34 MIL/uL (ref 3.87–5.11)
RDW: 12.5 % (ref 11.5–15.5)
WBC: 6.3 10*3/uL (ref 4.0–10.5)

## 2016-07-27 LAB — COMPREHENSIVE METABOLIC PANEL
ALBUMIN: 3 g/dL — AB (ref 3.5–5.0)
ALT: 14 U/L (ref 14–54)
AST: 26 U/L (ref 15–41)
Alkaline Phosphatase: 75 U/L (ref 38–126)
Anion gap: 7 (ref 5–15)
BILIRUBIN TOTAL: 0.7 mg/dL (ref 0.3–1.2)
BUN: 15 mg/dL (ref 6–20)
CHLORIDE: 105 mmol/L (ref 101–111)
CO2: 24 mmol/L (ref 22–32)
CREATININE: 1.17 mg/dL — AB (ref 0.44–1.00)
Calcium: 10 mg/dL (ref 8.9–10.3)
GFR calc Af Amer: 45 mL/min — ABNORMAL LOW (ref >60)
GFR calc non Af Amer: 39 mL/min — ABNORMAL LOW (ref >60)
GLUCOSE: 253 mg/dL — AB (ref 65–99)
POTASSIUM: 4.1 mmol/L (ref 3.5–5.1)
Sodium: 136 mmol/L (ref 135–145)
Total Protein: 6.3 g/dL — ABNORMAL LOW (ref 6.5–8.1)

## 2016-07-27 LAB — GLUCOSE, CAPILLARY
Glucose-Capillary: 103 mg/dL — ABNORMAL HIGH (ref 65–99)
Glucose-Capillary: 183 mg/dL — ABNORMAL HIGH (ref 65–99)
Glucose-Capillary: 97 mg/dL (ref 65–99)

## 2016-07-27 LAB — PROTIME-INR
INR: 1.12
Prothrombin Time: 14.4 seconds (ref 11.4–15.2)

## 2016-07-27 LAB — I-STAT TROPONIN, ED: TROPONIN I, POC: 0 ng/mL (ref 0.00–0.08)

## 2016-07-27 LAB — APTT: aPTT: 37 seconds — ABNORMAL HIGH (ref 24–36)

## 2016-07-27 LAB — RAPID URINE DRUG SCREEN, HOSP PERFORMED
AMPHETAMINES: NOT DETECTED
BENZODIAZEPINES: NOT DETECTED
Barbiturates: NOT DETECTED
Cocaine: NOT DETECTED
Opiates: NOT DETECTED
TETRAHYDROCANNABINOL: NOT DETECTED

## 2016-07-27 LAB — ETHANOL

## 2016-07-27 MED ORDER — ASPIRIN 325 MG PO TABS
325.0000 mg | ORAL_TABLET | Freq: Every day | ORAL | Status: DC
  Administered 2016-07-27 – 2016-07-28 (×2): 325 mg via ORAL
  Filled 2016-07-27 (×2): qty 1

## 2016-07-27 MED ORDER — STROKE: EARLY STAGES OF RECOVERY BOOK
Freq: Once | Status: AC
  Administered 2016-07-27: 21:00:00
  Filled 2016-07-27: qty 1

## 2016-07-27 MED ORDER — ACETAMINOPHEN 650 MG RE SUPP: 650.0000 mg | RECTAL | Status: DC | PRN

## 2016-07-27 MED ORDER — IOPAMIDOL (ISOVUE-370) INJECTION 76%
INTRAVENOUS | Status: AC
  Filled 2016-07-27: qty 50

## 2016-07-27 MED ORDER — SODIUM CHLORIDE 0.9 % IV SOLN
INTRAVENOUS | Status: AC
  Administered 2016-07-27 (×2): via INTRAVENOUS

## 2016-07-27 MED ORDER — ACETAMINOPHEN 160 MG/5ML PO SOLN
650.0000 mg | ORAL | Status: DC | PRN
Start: 2016-07-27 — End: 2016-07-31

## 2016-07-27 MED ORDER — SALINE SPRAY 0.65 % NA SOLN
1.0000 | Freq: Every day | NASAL | Status: DC | PRN
  Filled 2016-07-27: qty 44

## 2016-07-27 MED ORDER — LATANOPROST 0.005 % OP SOLN
1.0000 [drp] | Freq: Every day | OPHTHALMIC | Status: DC
  Administered 2016-07-27 – 2016-07-30 (×4): 1 [drp] via OPHTHALMIC
  Filled 2016-07-27: qty 2.5

## 2016-07-27 MED ORDER — DULOXETINE HCL 30 MG PO CPEP
30.0000 mg | ORAL_CAPSULE | Freq: Every day | ORAL | Status: DC
  Administered 2016-07-27 – 2016-07-31 (×5): 30 mg via ORAL
  Filled 2016-07-27 (×5): qty 1

## 2016-07-27 MED ORDER — ALBUTEROL SULFATE (2.5 MG/3ML) 0.083% IN NEBU
2.5000 mg | INHALATION_SOLUTION | RESPIRATORY_TRACT | Status: DC | PRN
Start: 2016-07-27 — End: 2016-07-31

## 2016-07-27 MED ORDER — LORAZEPAM 2 MG/ML IJ SOLN
0.5000 mg | Freq: Once | INTRAMUSCULAR | Status: AC | PRN
  Administered 2016-07-27: 0.5 mg via INTRAVENOUS
  Filled 2016-07-27: qty 1

## 2016-07-27 MED ORDER — INSULIN ASPART 100 UNIT/ML ~~LOC~~ SOLN
0.0000 [IU] | Freq: Three times a day (TID) | SUBCUTANEOUS | Status: DC
  Administered 2016-07-29: 3 [IU] via SUBCUTANEOUS

## 2016-07-27 MED ORDER — BUDESONIDE 0.25 MG/2ML IN SUSP
0.2500 mg | Freq: Two times a day (BID) | RESPIRATORY_TRACT | Status: DC
  Administered 2016-07-27 – 2016-07-31 (×6): 0.25 mg via RESPIRATORY_TRACT
  Filled 2016-07-27 (×7): qty 2

## 2016-07-27 MED ORDER — ACETAMINOPHEN 325 MG PO TABS
650.0000 mg | ORAL_TABLET | ORAL | Status: DC | PRN
Start: 2016-07-27 — End: 2016-07-31
  Administered 2016-07-28: 650 mg via ORAL
  Filled 2016-07-27: qty 2

## 2016-07-27 MED ORDER — GABAPENTIN 100 MG PO CAPS
100.0000 mg | ORAL_CAPSULE | Freq: Every morning | ORAL | Status: DC
  Administered 2016-07-28 – 2016-07-31 (×4): 100 mg via ORAL
  Filled 2016-07-27 (×4): qty 1

## 2016-07-27 MED ORDER — DOCUSATE SODIUM 100 MG PO CAPS: 200.0000 mg | ORAL_CAPSULE | Freq: Every day | ORAL | Status: DC | PRN

## 2016-07-27 MED ORDER — GABAPENTIN 100 MG PO CAPS
200.0000 mg | ORAL_CAPSULE | Freq: Every day | ORAL | Status: DC
  Administered 2016-07-27 – 2016-07-30 (×4): 200 mg via ORAL
  Filled 2016-07-27 (×4): qty 2

## 2016-07-27 MED ORDER — POLYVINYL ALCOHOL 1.4 % OP SOLN
1.0000 [drp] | Freq: Two times a day (BID) | OPHTHALMIC | Status: DC | PRN
  Filled 2016-07-27: qty 15

## 2016-07-27 MED ORDER — SODIUM CHLORIDE 0.9 % IV BOLUS (SEPSIS)
500.0000 mL | Freq: Once | INTRAVENOUS | Status: AC
  Administered 2016-07-27: 500 mL via INTRAVENOUS

## 2016-07-27 MED ORDER — ASPIRIN 300 MG RE SUPP: 300.0000 mg | Freq: Every day | RECTAL | Status: DC

## 2016-07-27 MED ORDER — ENOXAPARIN SODIUM 30 MG/0.3ML ~~LOC~~ SOLN
30.0000 mg | SUBCUTANEOUS | Status: DC
  Administered 2016-07-27 – 2016-07-30 (×4): 30 mg via SUBCUTANEOUS
  Filled 2016-07-27 (×4): qty 0.3

## 2016-07-27 NOTE — ED Triage Notes (Signed)
Per EMS - pt from home. Pt lives alone, has home health RN visit her. Home health RN witnessed pt "drooling from mouth, confused, and both arms felt dead." Symptoms lasted 5 minutes and resolved. Similar episode 3 weeks ago. Pt is wearing cardiac monitor after following up w/ cardiologist after prev episode. 12-lead unremarkable. Negative stroke screen.

## 2016-07-27 NOTE — H&P (Signed)
Date: 07/27/2016               Patient Name:  Carolyn Werner MRN: [id number]  DOB: 04/13/1922 Age / Sex: 83 y.o., female   PCP: Lynn ItoLori Smith, MD              Medical Service: Internal Medicine Teaching Service              Attending Physician: Dr. Levert FeinsteinJames M Granfortuna, MD    First Contact: Alita ChyleJason Alistar Mcenery, MS 4 Pager: [phone]  Second Contact: Dr. Valentino NoseNathan Boswell Pager: [phone]       After Hours (After 5p/  First Contact Pager: [phone]  weekends / holidays): Second Contact Pager: [phone]   Chief Complaint: Hands felt weak, drooling, couldn't talk  History of Present Illness: Ms. Carolyn ListerWashington is a 83 year old woman with a history of hypertension, diabetes, CKD stage III, PVD with right leg AKA, who this morning felt normal until after breakfast when she had sudden onset weakness of both her arms and hands, drooling and difficulty talking. Her family states she was speaking very slowly and slurring her words. She did not have any headache, dizziness, lightheadedness, nausea or vomiting. Her symptoms lasted about 5 minutes before returning to normal, though they flared up again in the ED.  Her care taker contacted 911 and she was brought to the ED. In the ED, she was noted to have some mild sensory loss over the right side. While she was not overtly aphasic, she continued to have some mild word finding difficulties and complained of having difficulty forming sentences in her head. NIH stroke scale score was 2. Code stroke was initiated in the ED. CT of the head was unremarkable.  Additionally, the patient states she has had difficulty swallowing liquids and small solids like corn for the past several months. She also reports worsening weakness in her right hand over the same period.  Of note, the patient was seen here in the ED on 07/03/16 for near syncope accompanied by room spinning sensation. She reported having "palpitations for the past 2 years" accompanied by chest "burning". She was  referred to cardiology and a [employer] heart monitor was placed. The patient is on week 3 of the heart monitor and no cardiac events were [facility].  Meds: Current Facility-Administered Medications  Medication Dose Route Frequency Provider Last Rate Last Dose  . LORazepam (ATIVAN) injection 0.5 mg  0.5 mg Intravenous Once PRN Valentino NoseNathan Boswell, MD       Current Outpatient Prescriptions  Medication Sig Dispense Refill  . amLODipine (NORVASC) 2.5 MG tablet Take 2.5 mg by mouth every evening.     Marland Kitchen. aspirin 325 MG tablet Take 325 mg by mouth daily.    . Biotin 5 MG TABS Take 5 mg by mouth every evening.     . budesonide (PULMICORT) 0.25 MG/2ML nebulizer solution Take 0.25 mg by nebulization 2 (two) times daily.    . Cholecalciferol (VITAMIN D) 2000 units CAPS Take 2,000 Units by mouth daily.    Marland Kitchen. docusate sodium (COLACE) 100 MG capsule Take 200 mg by mouth at bedtime.     . DULoxetine (CYMBALTA) 30 MG capsule Take 30 mg by mouth daily.    . Flaxseed, Linseed, (FLAXSEED OIL) 1000 MG CAPS Take 1,000 mg by mouth 3 (three) times daily.    . fluticasone (FLONASE) 50 MCG/ACT nasal spray Place 1 spray into both nostrils daily as needed for allergies or rhinitis.    .Marland Kitchen  gabapentin (NEURONTIN) 100 MG capsule Increase to 1 capsule in AM and 2 capsules at bedtime x7days, then 2 capsules twice daily (Patient taking differently: Take 100-200 mg by mouth See admin instructions. Takes 1 capsule in AM and 2 capsules at bedtime) 120 capsule 0  . latanoprost (XALATAN) 0.005 % ophthalmic solution Place 1 drop into both eyes at bedtime.    . levalbuterol (XOPENEX HFA) 45 MCG/ACT inhaler Inhale 2 puffs into the lungs every 4 (four) hours as needed for wheezing.    Marland Kitchen loratadine (CLARITIN) 10 MG tablet Take 10 mg by mouth daily as needed for allergies.    . metoprolol tartrate (LOPRESSOR) 25 MG tablet Take 1 tablet (25 mg total) by mouth 2 (two) times daily. 60 tablet 4  . Multiple Vitamin  (MULTIVITAMIN WITH MINERALS) TABS tablet Take 1 tablet by mouth daily.    Marland Kitchen nystatin ointment (MYCOSTATIN) Apply 1 application topically daily as needed (for irritation).    Bertram Gala Glycol-Propyl Glycol (SYSTANE) 0.4-0.3 % SOLN Apply 1 drop to eye 2 (two) times daily as needed (for dry eyes).    . sodium chloride (OCEAN) 0.65 % SOLN nasal spray Place 1 spray into both nostrils daily as needed for congestion.    . traMADol (ULTRAM) 50 MG tablet Take 50 mg by mouth every 12 (twelve) hours as needed for moderate pain.       Allergies: Allergies as of 07/27/2016 - Review Complete 07/27/2016  Allergen Reaction Noted  . Atacand hct [candesartan cilexetil-hctz] Other (See Comments) 07/27/2016  . Carbamazepine Other (See Comments) 07/27/2016  . Erythromycin Diarrhea 07/27/2016  . Glucophage [metformin hcl] Other (See Comments) 07/27/2016  . Lipitor [atorvastatin] Other (See Comments) 07/27/2016  . Nsaids Nausea And Vomiting 07/27/2016  . Sulfa antibiotics Anaphylaxis 07/03/2016  . Ceclor [cefaclor] Hives 09/16/2014  . Clindamycin/lincomycin Other (See Comments) 07/27/2016  . Dilaudid [hydromorphone hcl] Rash 09/16/2014  . Penicillins Rash 09/16/2014  . Plavix [clopidogrel] Other (See Comments) 09/16/2014   Past Medical History:  Diagnosis Date  . Diabetes mellitus without complication ([name])   . Headache   . Hx of benign neoplasm of pituitary gland   . PVD (peripheral vascular disease) ([name])   . Shingles    Past Surgical History:  Procedure Laterality Date  . ABOVE KNEE LEG AMPUTATION Right    Family History  Problem Relation Age of Onset  . Dementia    . Hypertension Daughter    Social History   Social History  . Marital status: Unknown    Spouse name: N/A  . Number of children: N/A  . Years of education: N/A   Occupational History  . Not on file.   Social History Main Topics  . Smoking status: Former Smoker    Packs/day: 0.25    Years: 23.00    Quit date: 06/05/1973    . Smokeless tobacco: Never Used  . Alcohol use No  . Drug use: No  . Sexual activity: No   Other Topics Concern  . Not on file   Social History Narrative  . No narrative on file    Review of Systems: Pertinent items noted in HPI and remainder of comprehensive ROS otherwise negative.  Physical Exam: Blood pressure 142/62, pulse 61, temperature 98 F (36.7 C), resp. rate 19, SpO2 100 %. BP 142/62   Pulse 61   Temp 98 F (36.7 C)   Resp 19   SpO2 100%   General Appearance:    Alert, cooperative, no distress, generally well apearing  Head:    Normocephalic, without obvious abnormality, atraumatic  Eyes:    PERRL, conjunctiva/corneas clear, EOM's intact, no nystagmus  Ears:    Hearing aids in place  Throat:   Lips, mucosa, and tongue normal; teeth and gums normal  Back:     Symmetric, no curvature, ROM normal, no CVA tenderness  Lungs:     Clear to auscultation bilaterally, respirations unlabored  Chest Wall:    No tenderness or deformity   Heart:    Regular rate and rhythm, S1 and S2 normal, no murmur, rub   or gallop  Abdomen:     Soft, non-tender, bowel sounds active all four quadrants,    no masses, no organomegaly  Extremities:   Extremities normal, atraumatic, no cyanosis or edema, right leg AKA  Pulses:   2+ and symmetric all extremities  Neurologic:   Cranial Nerves: CNII-XII intact   Strength: normal strength throughout   Sensation: light touch intact throughout       Lab results: Basic Metabolic Panel:  Recent Labs  16/10/96 1111  NA 136  K 4.1  CL 105  CO2 24  GLUCOSE 253*  BUN 15  CREATININE 1.17*  CALCIUM 10.0   Liver Function Tests:  Recent Labs  07/27/16 1111  AST 26  ALT 14  ALKPHOS 75  BILITOT 0.7  PROT 6.3*  ALBUMIN 3.0*   CBC:  Recent Labs  07/27/16 1111  WBC 6.3  NEUTROABS 4.2  HGB 12.4  HCT 38.1  MCV 87.8  PLT 139*   Coagulation:  Recent Labs  07/27/16 1209  LABPROT 14.4  INR 1.12   Alcohol Level:  Recent  Labs  07/27/16 1116  ETH <5   Urinalysis:  Recent Labs  07/27/16 1210  COLORURINE YELLOW  LABSPEC 1.010  PHURINE 6.0  GLUCOSEU NEGATIVE  HGBUR NEGATIVE  BILIRUBINUR NEGATIVE  KETONESUR NEGATIVE  PROTEINUR NEGATIVE  NITRITE NEGATIVE  LEUKOCYTESUR SMALL*    Imaging results:  Dg Chest 2 View  Result Date: 07/27/2016 EXAM: CHEST  2 VIEW  IMPRESSION: Scarring left base. No edema or consolidation. Stable cardiac silhouette. There is aortic atherosclerosis.   Ct Head Code Stroke W/o Cm Result Date: 07/27/2016 EXAM: CT HEAD WITHOUT CONTRAST IMPRESSION: 1. No acute intracranial abnormality 2. ASPECTS is 10   Other results: EKG: normal EKG, normal sinus rhythm.  Assessment & Plan by Problem: Principal Problem:   Slurred speech Active Problems:   CKD (chronic kidney disease) stage 3, GFR 30-59 ml/min   Diabetes mellitus ([name])   Hypertension   Depression   Glaucoma   Neuropathic pain   Asthma  Transient ischemic attack: Patient had sudden onset neurologic deficits this AM that mostly resolved within 5 minutes. The deficits, slurred speech, possible word finding difficulty, and bilateral arm and hand weakness do not necessarily all fit into one vascular territory, but the story is otherwise consistent with a TIA. CT was normal and MRI pending. No sign of arrhythmia on [employer] monitor today. Code stroke eval initiated in ED. - Imaging: MRI, MRA of brain without contrast, Echocardiogram, carotid dopplers - Labs: HgbA1c, fasting lipid panel - PT, OT, Speech consult - Aspirin 81 - Telemetry monitoring - Frequent neuro checks - NPO till pass stroke swallow screen  Hypertension: Patient takes amlopidpine 2.5mg  qHS and metoprolol tartrate 25mg  BID at home.  - Holding anti-hypertensives to allow for permissive hypertension  Diabetes: Patient states she has history of diabetes. No Hgb A1c in our system. Does not appear to be taking  any meds. - CBGs  CKD Stage 3: Creatinine 1.17  which is stable from 07/03/2016.  Depression: Patient has history of depression and takes duloxetine 30mg  daily at home - Continue duloxetine once swallow screen passed  Asthma: Patient has history of asthma. She takes budesonide 0.25mg  nebulized BID and Xopenex q4H PRN for wheezing - Continue budesonide nebulizer  Neuropathic pain: Patient has neuropathic pain. She takes gabapentin 100mg  in the AM and 200mg  at bedtime.  Glaucoma: Patient has history of glaucoma. She takes latanoprost 0.005% one drop to both eyes at bedtime and Systane 1 drop in eye BID. - Continue latanoprost - Continue Systane  Code: Full Code  Dispo: Patient being admitted under observation status, expected discharge in less than 2 midnights.  This is a Psychologist, occupational Note.  The care of the patient was discussed with Dr. Karma Greaser and the assessment and plan was formulated with their assistance.  Please see their note for official documentation of the patient encounter.   Signed: Elmon Kirschner, Medical Student 07/27/2016, 4:06 PM

## 2016-07-27 NOTE — Progress Notes (Signed)
Pt arrived to 5C07 via stretcher.  Pt alert and oriented.  VSS.  Will continue to monitor.  Sondra ComeSilva, Kaytelyn Glore M, RN

## 2016-07-27 NOTE — Consult Note (Signed)
Requesting Physician: Dr. Eudelia Bunch    Chief Complaint: code stroke  History obtained from:  Patient   and the patient family  HPI:                                                                                                                                         Carolyn Werner is an 83 y.o. female who is apparently doing well until approximately 12:00 today when she was getting her pills ready with her pillbox and suddenly felt she was having somewhat of a hard time thinking, both arms felt heavy, and family thought that she had some slurred speech. Patient was brought to the hospital as a code stroke. Per physician assistant in the ED patient had cleared all her symptoms but then had waxing and waning slurred speech. At time of consultation all of her symptoms had fully resolved. CT of brain was obtained and showed no acute abnormalities.  Blood pressure was stable, lab showed no abnormalities.  Date last known well: Date: 07/27/2016 Time last known well: Time: 12:00 tPA Given: No: Symptoms resolved   Past Medical History:  Diagnosis Date  . Diabetes mellitus without complication ([name])   . Headache   . PVD (peripheral vascular disease) ([name])   . Shingles     History reviewed. No pertinent surgical history.  Family History  Problem Relation Age of Onset  . Dementia    . Hypertension Daughter    Social History:  reports that she has quit smoking. She has never used smokeless tobacco. She reports that she does not drink alcohol or use drugs.  Allergies:  Allergies  Allergen Reactions  . Sulfa Antibiotics Anaphylaxis  . Ceclor [Cefaclor] Hives  . Dilaudid [Hydromorphone Hcl] Rash  . Penicillins Rash  . Plavix [Clopidogrel] Other (See Comments)    Medications:                                                                                                                           Current Facility-Administered Medications  Medication Dose Route Frequency Provider Last  Rate Last Dose  . sodium chloride 0.9 % bolus 500 mL  500 mL Intravenous Once Everlene Farrier, PA-C       Current Outpatient Prescriptions  Medication Sig Dispense Refill  . amLODipine (NORVASC) 2.5 MG tablet Take 2.5 mg by mouth every evening.     Marland Kitchen  aspirin 325 MG tablet Take 325 mg by mouth daily.    . Biotin 5 MG TABS Take 5 mg by mouth every evening.     . budesonide (PULMICORT) 0.25 MG/2ML nebulizer solution Take 0.25 mg by nebulization 2 (two) times daily.    . Cholecalciferol (VITAMIN D) 2000 units CAPS Take 2,000 Units by mouth daily.    Marland Kitchen. docusate sodium (COLACE) 100 MG capsule Take 200 mg by mouth at bedtime.     . DULoxetine (CYMBALTA) 30 MG capsule Take 30 mg by mouth daily.    . Flaxseed, Linseed, (FLAXSEED OIL) 1000 MG CAPS Take 1,000 mg by mouth 3 (three) times daily.    . fluticasone (FLONASE) 50 MCG/ACT nasal spray Place 1 spray into both nostrils daily as needed for allergies or rhinitis.    Marland Kitchen. gabapentin (NEURONTIN) 100 MG capsule Increase to 1 capsule in AM and 2 capsules at bedtime x7days, then 2 capsules twice daily (Patient taking differently: Take 100-200 mg by mouth See admin instructions. Takes 1 capsule in AM and 2 capsules at bedtime) 120 capsule 0  . latanoprost (XALATAN) 0.005 % ophthalmic solution Place 1 drop into both eyes at bedtime.    . levalbuterol (XOPENEX HFA) 45 MCG/ACT inhaler Inhale 2 puffs into the lungs every 4 (four) hours as needed for wheezing.    Marland Kitchen. loratadine (CLARITIN) 10 MG tablet Take 10 mg by mouth daily as needed for allergies.    . metoprolol tartrate (LOPRESSOR) 25 MG tablet Take 1 tablet (25 mg total) by mouth 2 (two) times daily. 60 tablet 4  . Multiple Vitamin (MULTIVITAMIN WITH MINERALS) TABS tablet Take 1 tablet by mouth daily.    Marland Kitchen. nystatin ointment (MYCOSTATIN) Apply 1 application topically daily as needed (for irritation).    Bertram Gala. Polyethyl Glycol-Propyl Glycol (SYSTANE) 0.4-0.3 % SOLN Apply 1 drop to eye 2 (two) times daily as needed  (for dry eyes).    . sodium chloride (OCEAN) 0.65 % SOLN nasal spray Place 1 spray into both nostrils daily as needed for congestion.    . traMADol (ULTRAM) 50 MG tablet Take 50 mg by mouth every 12 (twelve) hours as needed for moderate pain.        ROS:                                                                                                                                       History obtained from the patient  General ROS: negative for - chills, fatigue, fever, night sweats, weight gain or weight loss Psychological ROS: negative for - behavioral disorder, hallucinations, memory difficulties, mood swings or suicidal ideation Ophthalmic ROS: negative for - blurry vision, double vision, eye pain or loss of vision ENT ROS: negative for - epistaxis, nasal discharge, oral lesions, sore throat, tinnitus or vertigo Allergy and Immunology ROS: negative for - hives or itchy/watery eyes Hematological and Lymphatic ROS: negative for -  bleeding problems, bruising or swollen lymph nodes Endocrine ROS: negative for - galactorrhea, hair pattern changes, polydipsia/polyuria or temperature intolerance Respiratory ROS: negative for - cough, hemoptysis, shortness of breath or wheezing Cardiovascular ROS: negative for - chest pain, dyspnea on exertion, edema or irregular heartbeat Gastrointestinal ROS: negative for - abdominal pain, diarrhea, hematemesis, nausea/vomiting or stool incontinence Genito-Urinary ROS: negative for - dysuria, hematuria, incontinence or urinary frequency/urgency Musculoskeletal ROS: negative for - joint swelling or muscular weakness Neurological ROS: as noted in HPI Dermatological ROS: negative for rash and skin lesion changes  Neurologic Examination:                                                                                                      Blood pressure 142/57, pulse 69, temperature 98 F (36.7 C), temperature source Oral, resp. rate 13, SpO2 99 %.  HEENT-   Normocephalic, no lesions, without obvious abnormality.  Normal external eye and conjunctiva.  Normal TM's bilaterally.  Normal auditory canals and external ears. Normal external nose, mucus membranes and septum.  Normal pharynx. Cardiovascular- S1, S2 normal, pulses palpable throughout   Lungs- chest clear, no wheezing, rales, normal symmetric air entry Abdomen- normal findings: bowel sounds normal Extremities- no edema Lymph-no adenopathy palpable Musculoskeletal-no joint tenderness, deformity or swelling Skin-warm and dry, no hyperpigmentation, vitiligo, or suspicious lesions  Neurological Examination Mental Status: Alert, oriented, thought content appropriate.  Speech fluent without evidence of aphasia.  Able to follow 3 step commands without difficulty. Cranial Nerves: II: Visual fields grossly normal,  III,IV, VI: ptosis not present, extra-ocular motions intact bilaterally, pupils equal, round, reactive to light and accommodation V,VII: smile symmetric, facial light touch sensation normal bilaterally VIII: hearing decreased bilaterally and wears hearing aids IX,X: uvula rises symmetrically XI: bilateral shoulder shrug XII: midline tongue extension Motor: 4/5 throughout Sensory: Right-sided decreased sensation Deep Tendon Reflexes: 1+ and symmetric throughout Plantars: Right: downgoing   Left: downgoing Cerebellar: normal finger-to-nose,and normal heel-to-shin test Gait: Not tested       Lab Results: Basic Metabolic Panel:  Recent Labs Lab 07/27/16 1111  NA 136  K 4.1  CL 105  CO2 24  GLUCOSE 253*  BUN 15  CREATININE 1.17*  CALCIUM 10.0    Liver Function Tests:  Recent Labs Lab 07/27/16 1111  AST 26  ALT 14  ALKPHOS 75  BILITOT 0.7  PROT 6.3*  ALBUMIN 3.0*   No results for input(s): LIPASE, AMYLASE in the last 168 hours. No results for input(s): AMMONIA in the last 168 hours.  CBC:  Recent Labs Lab 07/27/16 1111  WBC 6.3  NEUTROABS 4.2  HGB  12.4  HCT 38.1  MCV 87.8  PLT 139*    Cardiac Enzymes: No results for input(s): CKTOTAL, CKMB, CKMBINDEX, TROPONINI in the last 168 hours.  Lipid Panel: No results for input(s): CHOL, TRIG, HDL, CHOLHDL, VLDL, LDLCALC in the last 168 hours.  CBG: No results for input(s): GLUCAP in the last 168 hours.  Microbiology: No results found for this or any previous visit.  Coagulation Studies: No results for input(s): LABPROT, INR  in the last 72 hours.  Imaging: Ct Head Code Stroke W/o Cm  Result Date: 07/27/2016 CLINICAL DATA:  Code stroke. Left facial numbness and drooling. Slurred speech. Bilateral upper extremity numbness. EXAM: CT HEAD WITHOUT CONTRAST TECHNIQUE: Contiguous axial images were obtained from the base of the skull through the vertex without intravenous contrast. COMPARISON:  None. FINDINGS: Brain: Mild atrophy for age. Negative for hydrocephalus. Negative for acute infarct. Negative for hemorrhage or mass. No shift of the midline structures. Vascular: Moderate atherosclerotic calcification. No hyperdense vessel. Skull: Negative Sinuses/Orbits: Mucoperiosteal thickening of the maxillary and sphenoid sinus bilaterally. Mastoid sinus clear. No orbital lesion. Bilateral lens replacement. Other: None ASPECTS ([place] Stroke Program Early CT Score) - Ganglionic level infarction (caudate, lentiform nuclei, internal capsule, insula, M1-M3 cortex): 7 - Supraganglionic infarction (M4-M6 cortex): 3 Total score (0-10 with 10 being normal): 10 IMPRESSION: 1. No acute intracranial abnormality 2. ASPECTS is 10 Electronically Signed   By: Marlan Palau M.D.   On: 07/27/2016 12:33       Assessment and plan discussed with with attending physician and they are in agreement.    Felicie Morn PA-C [facility] [phone]  07/27/2016, 12:39 PM   Assessment: 83 y.o. female with transient altered mental status and bilateral arm weakness that has fully resolved. CT of head shows no  acute intracranial abnormality. Given her symptoms, age, and risk factors patient would benefit from MRI of brain to evaluate for any stroke.   Stroke Risk Factors - diabetes mellitus  1. HgbA1c, fasting lipid panel 2. MRI, MRA  of the brain without contrast 3. PT consult, OT consult, Speech consult 4. Echocardiogram 5. Carotid dopplers 6. Prophylactic therapy-Antiplatelet med: Aspirin - dose 81 mg 7. Risk factor modification 8. Telemetry monitoring 9. Frequent neuro checks 10 NPO until passes stroke swallow screen 11 please page stroke NP  Or  PA  Or MD from 8am -4 pm  as this patient from this time will be  followed by the stroke.   You can look them up on [email or web address]  Password [email or web address]

## 2016-07-27 NOTE — ED Notes (Signed)
Denny PeonErin, RN contacted events dept [phone]) at [employer] heart monitor - no cardiac events today.

## 2016-07-27 NOTE — ED Notes (Signed)
EDP at bedside  

## 2016-07-27 NOTE — H&P (Signed)
Date: 07/27/2016               Patient Name:  Perpetua Elling MRN: [id number]  DOB: 01-Feb-1922 Age / Sex: 83 y.o., female   PCP: Lynn Ito, MD         Medical Service: Internal Medicine Teaching Service         Attending Physician: Dr. Levert Feinstein, MD    First Contact: Lorette Ang, MS IV Pager: [phone]  Second Contact: Dr. Karma Greaser Pager: [phone]       After Hours (After 5p/  First Contact Pager: [phone]  weekends / holidays): Second Contact Pager: [phone]   Chief Complaint: Bilateral Hand Heaviness, Slurred Speech  History of Present Illness:  Mr. Reihl is a very pleasant 83 year old female with a past medical history including hypertension, type 2 diabetes mellitus, CKD stage III, peripheral vascular disease s/p right leg AKA presents today with complaints of slurred speech and bilateral hand heaviness. She states that she was in her usual state of health this morning. After breakfast she experienced sudden onset feeling of heaviness and weakness in both of her hands. She also noted drooling out of her mouth and difficulty speaking. Daughter at bedside notes that when she talked with her on the phone her speech was very slurred and difficult to understand and hand her care giver call 911 immediately. She reports that her symptoms lasted approximately 5 minutes and completely resolved. No symptoms noted on arrival to the ED. However, daughter reports she has had episodes of intermittent confusion and slurred speech while in the emergency department. She denies any weakness currently, numbness, difficulty with word finding, headaches, vision changes. No facial droop was ever noted by anyone.  In the ED, she initially was asymptomatic but was noted to have slurred speech without any focal deficits. NIH stroke scale score was 2 and code stroke was called. CT head was unrmarkbale. Returned to baseline after code stroke was called.   Of note, she was seen in the Ed on  07/03/16 following a near syncopal episode accompanied by room spinning. She was set up to have LifeWatch heart monitor; has been in place for 3 weeks. No cardiac events noted per [facility] department.  She does complain of difficulty swallowing both liquids and solids over the past 2-3 months. Denies any pain with swallowing or difficulty initiating swallowing; rather feels that food get stuck in her throat.   Meds:  Current Meds  Medication Sig  . amLODipine (NORVASC) 2.5 MG tablet Take 2.5 mg by mouth every evening.   Marland Kitchen aspirin 325 MG tablet Take 325 mg by mouth daily.  . Biotin 5 MG TABS Take 5 mg by mouth every evening.   . budesonide (PULMICORT) 0.25 MG/2ML nebulizer solution Take 0.25 mg by nebulization 2 (two) times daily.  . Cholecalciferol (VITAMIN D) 2000 units CAPS Take 2,000 Units by mouth daily.  Marland Kitchen docusate sodium (COLACE) 100 MG capsule Take 200 mg by mouth at bedtime.   . DULoxetine (CYMBALTA) 30 MG capsule Take 30 mg by mouth daily.  . Flaxseed, Linseed, (FLAXSEED OIL) 1000 MG CAPS Take 1,000 mg by mouth 3 (three) times daily.  . fluticasone (FLONASE) 50 MCG/ACT nasal spray Place 1 spray into both nostrils daily as needed for allergies or rhinitis.  Marland Kitchen gabapentin (NEURONTIN) 100 MG capsule Increase to 1 capsule in AM and 2 capsules at bedtime x7days, then 2 capsules twice daily (Patient taking differently: Take 100-200 mg by mouth  See admin instructions. Takes 1 capsule in AM and 2 capsules at bedtime)  . latanoprost (XALATAN) 0.005 % ophthalmic solution Place 1 drop into both eyes at bedtime.  . levalbuterol (XOPENEX HFA) 45 MCG/ACT inhaler Inhale 2 puffs into the lungs every 4 (four) hours as needed for wheezing.  Marland Kitchen. loratadine (CLARITIN) 10 MG tablet Take 10 mg by mouth daily as needed for allergies.  . metoprolol tartrate (LOPRESSOR) 25 MG tablet Take 1 tablet (25 mg total) by mouth 2 (two) times daily.  . Multiple Vitamin (MULTIVITAMIN WITH MINERALS) TABS tablet Take 1 tablet  by mouth daily.  Marland Kitchen. nystatin ointment (MYCOSTATIN) Apply 1 application topically daily as needed (for irritation).  Bertram Gala. Polyethyl Glycol-Propyl Glycol (SYSTANE) 0.4-0.3 % SOLN Apply 1 drop to eye 2 (two) times daily as needed (for dry eyes).  . sodium chloride (OCEAN) 0.65 % SOLN nasal spray Place 1 spray into both nostrils daily as needed for congestion.  . traMADol (ULTRAM) 50 MG tablet Take 50 mg by mouth every 12 (twelve) hours as needed for moderate pain.      Allergies: Allergies as of 07/27/2016 - Review Complete 07/27/2016  Allergen Reaction Noted  . Atacand hct [candesartan cilexetil-hctz] Other (See Comments) 07/27/2016  . Carbamazepine Other (See Comments) 07/27/2016  . Erythromycin Diarrhea 07/27/2016  . Glucophage [metformin hcl] Other (See Comments) 07/27/2016  . Lipitor [atorvastatin] Other (See Comments) 07/27/2016  . Nsaids Nausea And Vomiting 07/27/2016  . Sulfa antibiotics Anaphylaxis 07/03/2016  . Ceclor [cefaclor] Hives 09/16/2014  . Clindamycin/lincomycin Other (See Comments) 07/27/2016  . Dilaudid [hydromorphone hcl] Rash 09/16/2014  . Penicillins Rash 09/16/2014  . Plavix [clopidogrel] Other (See Comments) 09/16/2014   Past Medical History:  Diagnosis Date  . Diabetes mellitus without complication ([name])   . Headache   . Hx of benign neoplasm of pituitary gland   . PVD (peripheral vascular disease) ([name])   . Shingles    Past Surgical History:  Procedure Laterality Date  . ABOVE KNEE LEG AMPUTATION Right    Family History:  Family History  Problem Relation Age of Onset  . Dementia    . Hypertension Daughter    Social History:  Social History   Social History  . Marital status: Unknown    Spouse name: N/A  . Number of children: N/A  . Years of education: N/A   Social History Main Topics  . Smoking status: Former Smoker    Packs/day: 0.25    Years: 23.00    Quit date: 06/05/1973  . Smokeless tobacco: Never Used  . Alcohol use No  . Drug use: No   . Sexual activity: No   Other Topics Concern  . None   Social History Narrative  . None   Review of Systems: A complete ROS was negative except as per HPI.   Physical Exam: Blood pressure (!) 153/71, pulse 70, temperature 97.5 F (36.4 C), temperature source Oral, resp. rate 18, SpO2 100 %. Physical Exam  Constitutional: She is oriented to person, place, and time and well-developed, well-nourished, and in no distress.  HENT:  Head: Normocephalic and atraumatic.  Eyes: EOM are normal. Pupils are equal, round, and reactive to light. Right eye exhibits no nystagmus. Left eye exhibits no nystagmus.  Neck: Normal range of motion.  Cardiovascular: Normal rate and regular rhythm.  Exam reveals no gallop and no friction rub.   No murmur heard. Pulmonary/Chest: Effort normal and breath sounds normal. No respiratory distress. She has no wheezes. She has no rales.  She exhibits no tenderness.  Abdominal: Soft. Bowel sounds are normal. She exhibits no distension. There is no tenderness.  Musculoskeletal: Normal range of motion. She exhibits no edema.  Right AKA  Neurological: She is alert and oriented to person, place, and time. She has normal sensation, normal strength, normal reflexes and intact cranial nerves. She has a normal Finger-Nose-Finger Test. She shows no pronator drift.  Skin: Skin is warm and dry.  Psychiatric: Mood and affect normal.  Nursing note and vitals reviewed.  EKG: NSR  CXR: IMPRESSION: Scarring left base. No edema or consolidation. Stable cardiac silhouette. There is aortic atherosclerosis.  CT Head:IMPRESSION: 1. No acute intracranial abnormality  Assessment & Plan by Problem:  Possible TIA vs CVA:  Sudden onset slurred speech and bilateral hand weakness lasting 5 minutes. Has had witnessed repeat episodes of slurred speech in the ED concerning for possible continued CVA. No signs of infection; VSS, CXR clear, UA unremarkable. UDS in the ED negative. Her  bilateral hand symptoms are difficult to explain but her slurred speech and difficultly with word finding (noted in neuro note) could be explained by acute stroke or TIA. CT head was negative. MRI pending. Will continue stroke work up and monitor overnight.  -MRI/MRA brain -ECHO -Cartoid dopplers -A1c -Lipid panel -PT/OT, SLP -ASA -Frequent neuro checks  HTN: On amlodipine 2.5 mg and metoprolol 25 mg bid at home.  Allowing permissive HTN  DM: No A1c in EPIC. Not on any DM medications at home. -A1C -CBGs qac, qhs  CKD Stage III: Cr 1.17 which appears to be her baseline  Depression: Continue home duloxetine 30 mg daily  Asthma: Continue home budesonide 0.25 mg bid Albuterol q4hr prn wheezing  Neuropathic pain Continue home gabapentin  Glaucoma Continue home latanoprost and systane  Dispo: Admit patient to Observation with expected length of stay less than 2 midnights.  Signed: Valentino Nose, MD 07/27/2016, 6:51 PM  Pager: [phone]

## 2016-07-27 NOTE — ED Notes (Signed)
Pt. Speech back to baseline during interview with EDP.

## 2016-07-27 NOTE — Code Documentation (Signed)
83 y.o. female with hx of DM, PVD, right AKA and H/A arrives to [facility] ED via [facility]. Pt arrived at 1050 today with c/o BUE numbness, slurred speech and drooling that had resolved on arrival. Patient reports she was trying to organize her pills when she felt both of her arms felt weak and useless, she had drooling from her mouth and slurred speech. She reports this lasted for about 5 mins and resolved on its own. Home health nurse also noticed the slurred speech. On arrival to the ER the patient reports she is feeling back to normal. After initial interview patient's family reports she is slurring her speech again around 12:05 pm. She states she feels something is not right and her vision seems "off." She was seen in the emergency department 3 weeks ago and was worked up for cardiac events. She is currently wearing a LifeWatch heart monitor. No cardiac events today, per [facility] department. Code stroke was called d/t symptoms returning. Labs were drawn and pt taken to CT. CT showing no acute intracranial abnormality. On assessment, pt reports that she feels like she is speaking slowly with difficulty organizing her thoughts. NIHSS 3. See EMR for NIHSS and code stroke times. Pt with right sided sensory deficit and intermittent dysarthria. tPA not given d/t symptoms resolving. Pt to be TIA Alert. Bedside handoff with ED RN Denny Peon

## 2016-07-27 NOTE — ED Provider Notes (Signed)
[facility]-EMERGENCY DEPT Provider Note   CSN: [id number] Arrival date & time: 07/27/16  1050     History   Chief Complaint Chief Complaint  Patient presents with  . Weakness    HPI Carolyn Werner is a 83 y.o. female.  Carolyn Werner is a 83 y.o. Female with a history of diabetes and peripheral vascular disease who presents to the ED via EMS complaining of episodic weakness earlier today. Patient reports she was trying to organize her pills when she felt both of her arms felt weak and useless, she had drooling from her mouth and slurred speech. She reports this lasted for about 5 mins and resolved on its own. Home health nurse also noticed the slurred speech. On arrival to the ER the patient reports she is feeling back to normal. After initial interview patient's family reports she is slurring her speech again around 12:05 pm. She tells me she feels something is not right and her vision seems "off." She was seen in the emergency department 3 weeks ago and his workup for cardiac events. She is currently wearing a LifeWatch heart monitor. No cardiac events today, per [facility] department. She reports feeling room spinning dizziness earlier today. Previous right above-the-knee amputation. She denies fevers, recent illness, cough, chest pain, shortness of breath, headache, abdominal pain, nausea, vomiting, urinary symptoms or rashes.   The history is provided by the patient and medical records. No language interpreter was used.  Weakness  Primary symptoms include dizziness. Pertinent negatives include no shortness of breath, no chest pain, no vomiting and no headaches.    Past Medical History:  Diagnosis Date  . Diabetes mellitus without complication ([name])   . Headache   . PVD (peripheral vascular disease) ([name])   . Shingles     Patient Active Problem List   Diagnosis Date Noted  . Slurred speech 07/27/2016  . Heart palpitations 07/05/2016  . Cardiac murmur 07/05/2016  . Phantom  limb pain ([name]) 11/13/2014  . Cervical disc disorder with radiculopathy of cervical region 11/13/2014    History reviewed. No pertinent surgical history.  OB History    No data available       Home Medications    Prior to Admission medications   Medication Sig Start Date End Date Taking? Authorizing Provider  amLODipine (NORVASC) 2.5 MG tablet Take 2.5 mg by mouth every evening.    Yes Historical Provider, MD  aspirin 325 MG tablet Take 325 mg by mouth daily.   Yes Historical Provider, MD  Biotin 5 MG TABS Take 5 mg by mouth every evening.    Yes Historical Provider, MD  budesonide (PULMICORT) 0.25 MG/2ML nebulizer solution Take 0.25 mg by nebulization 2 (two) times daily.   Yes Historical Provider, MD  Cholecalciferol (VITAMIN D) 2000 units CAPS Take 2,000 Units by mouth daily.   Yes Historical Provider, MD  docusate sodium (COLACE) 100 MG capsule Take 200 mg by mouth at bedtime.    Yes Historical Provider, MD  DULoxetine (CYMBALTA) 30 MG capsule Take 30 mg by mouth daily.   Yes Historical Provider, MD  Flaxseed, Linseed, (FLAXSEED OIL) 1000 MG CAPS Take 1,000 mg by mouth 3 (three) times daily.   Yes Historical Provider, MD  fluticasone (FLONASE) 50 MCG/ACT nasal spray Place 1 spray into both nostrils daily as needed for allergies or rhinitis.   Yes Historical Provider, MD  gabapentin (NEURONTIN) 100 MG capsule Increase to 1 capsule in AM and 2 capsules at bedtime x7days, then 2 capsules twice daily  Patient taking differently: Take 100-200 mg by mouth See admin instructions. Takes 1 capsule in AM and 2 capsules at bedtime 09/16/14  Yes Adam Mliss Fritz Jaffe, DO  latanoprost (XALATAN) 0.005 % ophthalmic solution Place 1 drop into both eyes at bedtime.   Yes Historical Provider, MD  levalbuterol [facility](XOPENEX HFA) 45 MCG/ACT inhaler Inhale 2 puffs into the lungs every 4 (four) hours as needed for wheezing.   Yes Historical Provider, MD  loratadine (CLARITIN) 10 MG tablet Take 10 mg by mouth daily as  needed for allergies.   Yes Historical Provider, MD  metoprolol tartrate (LOPRESSOR) 25 MG tablet Take 1 tablet (25 mg total) by mouth 2 (two) times daily. 07/05/16  Yes Brittainy Sherlynn CarbonM Simmons, PA-C  Multiple Vitamin (MULTIVITAMIN WITH MINERALS) TABS tablet Take 1 tablet by mouth daily.   Yes Historical Provider, MD  nystatin ointment (MYCOSTATIN) Apply 1 application topically daily as needed (for irritation).   Yes Historical Provider, MD  Polyethyl Glycol-Propyl Glycol (SYSTANE) 0.4-0.3 % SOLN Apply 1 drop to eye 2 (two) times daily as needed (for dry eyes).   Yes Historical Provider, MD  sodium chloride (OCEAN) 0.65 % SOLN nasal spray Place 1 spray into both nostrils daily as needed for congestion.   Yes Historical Provider, MD  traMADol (ULTRAM) 50 MG tablet Take 50 mg by mouth every 12 (twelve) hours as needed for moderate pain.    Yes Historical Provider, MD    Family History Family History  Problem Relation Age of Onset  . Dementia    . Hypertension Daughter     Social History Social History  Substance Use Topics  . Smoking status: Former Games developermoker  . Smokeless tobacco: Never Used  . Alcohol use No     Allergies   Atacand hct [candesartan cilexetil-hctz]; Carbamazepine; Erythromycin; Glucophage [metformin hcl]; Lipitor [atorvastatin]; Nsaids; Sulfa antibiotics; Ceclor [cefaclor]; Clindamycin/lincomycin; Dilaudid [hydromorphone hcl]; Penicillins; and Plavix [clopidogrel]   Review of Systems Review of Systems  Constitutional: Negative for chills and fever.  HENT: Negative for congestion and sore throat.   Eyes: Positive for visual disturbance.  Respiratory: Negative for cough, shortness of breath and wheezing.   Cardiovascular: Negative for chest pain and palpitations.  Gastrointestinal: Negative for abdominal pain, diarrhea, nausea and vomiting.  Genitourinary: Negative for difficulty urinating and dysuria.  Musculoskeletal: Negative for back pain and neck pain.  Skin: Negative  for rash.  Neurological: Positive for dizziness, speech difficulty and weakness. Negative for syncope, light-headedness, numbness and headaches.     Physical Exam Updated Vital Signs BP 142/62   Pulse 61   Temp 98 F (36.7 C)   Resp 19   SpO2 100%   Physical Exam  Constitutional: She is oriented to person, place, and time. She appears well-developed and well-nourished. No distress.  Nontoxic-appearing.  HENT:  Head: Normocephalic and atraumatic.  Right Ear: External ear normal.  Left Ear: External ear normal.  Mouth/Throat: Oropharynx is clear and moist.  Eyes: Conjunctivae and EOM are normal. Pupils are equal, round, and reactive to light. Right eye exhibits no discharge. Left eye exhibits no discharge.  Neck: Normal range of motion. Neck supple. No JVD present. No tracheal deviation present.  Cardiovascular: Normal rate, regular rhythm, normal heart sounds and intact distal pulses.  Exam reveals no gallop and no friction rub.   No murmur heard. Pulmonary/Chest: Effort normal and breath sounds normal. No stridor. No respiratory distress. She has no wheezes. She has no rales.  Abdominal: Soft. She exhibits no mass. There is  no tenderness. There is no guarding.  Musculoskeletal: Normal range of motion. She exhibits no edema, tenderness or deformity.  Right above-the-knee amputation. No left lower leg extremity edema or tenderness. Good strength to her left leg.  Lymphadenopathy:    She has no cervical adenopathy.  Neurological: She is alert and oriented to person, place, and time. No cranial nerve deficit or sensory deficit. She exhibits normal muscle tone. Coordination normal.  Patient is alert and oriented 3. Cranial nerves are intact. Speech is clear and coherent. No pronator drift. Finger to nose intact bilaterally. Sensation is intact or bilateral upper and lower extremities. EOMs are intact. Vision is grossly intact.  Skin: Skin is warm and dry. Capillary refill takes less  than 2 seconds. No rash noted. She is not diaphoretic. No erythema. No pallor.  Psychiatric: She has a normal mood and affect. Her behavior is normal.  Nursing note and vitals reviewed.    ED Treatments / Results  Labs (all labs ordered are listed, but only abnormal results are displayed) Labs Reviewed  COMPREHENSIVE METABOLIC PANEL - Abnormal; Notable for the following:       Result Value   Glucose, Bld 253 (*)    Creatinine, Ser 1.17 (*)    Total Protein 6.3 (*)    Albumin 3.0 (*)    GFR calc non Af Amer 39 (*)    GFR calc Af Amer 45 (*)    All other components within normal limits  CBC WITH DIFFERENTIAL/PLATELET - Abnormal; Notable for the following:    Platelets 139 (*)    All other components within normal limits  URINALYSIS, ROUTINE W REFLEX MICROSCOPIC - Abnormal; Notable for the following:    Leukocytes, UA SMALL (*)    Squamous Epithelial / LPF 0-5 (*)    All other components within normal limits  APTT - Abnormal; Notable for the following:    aPTT 37 (*)    All other components within normal limits  ETHANOL  PROTIME-INR  RAPID URINE DRUG SCREEN, HOSP PERFORMED  I-STAT TROPOININ, ED    EKG  EKG Interpretation None       Radiology Dg Chest 2 View  Result Date: 07/27/2016 CLINICAL DATA:  Weakness EXAM: CHEST  2 VIEW COMPARISON:  July 03, 2016 FINDINGS: There is scarring in the left base. There is no edema or consolidation. Heart size and pulmonary vascularity are normal. No adenopathy. There is atherosclerotic calcification aorta. There is degenerative change in the thoracic spine. IMPRESSION: Scarring left base. No edema or consolidation. Stable cardiac silhouette. There is aortic atherosclerosis. Electronically Signed   By: Bretta Bang III M.D.   On: 07/27/2016 13:52   Ct Head Code Stroke W/o Cm  Addendum Date: 07/27/2016   ADDENDUM REPORT: 07/27/2016 12:43 ADDENDUM: These results were called by telephone at the time of interpretation on 07/27/2016 at  12:43 pm to Dr. Roxy Manns, who verbally acknowledged these results. Electronically Signed   By: Marlan Palau M.D.   On: 07/27/2016 12:43   Result Date: 07/27/2016 CLINICAL DATA:  Code stroke. Left facial numbness and drooling. Slurred speech. Bilateral upper extremity numbness. EXAM: CT HEAD WITHOUT CONTRAST TECHNIQUE: Contiguous axial images were obtained from the base of the skull through the vertex without intravenous contrast. COMPARISON:  None. FINDINGS: Brain: Mild atrophy for age. Negative for hydrocephalus. Negative for acute infarct. Negative for hemorrhage or mass. No shift of the midline structures. Vascular: Moderate atherosclerotic calcification. No hyperdense vessel. Skull: Negative Sinuses/Orbits: Mucoperiosteal thickening of the maxillary and  sphenoid sinus bilaterally. Mastoid sinus clear. No orbital lesion. Bilateral lens replacement. Other: None ASPECTS ([place]berta Stroke Program Early CT Score) - Ganglionic level infarction (caudate, lentiform nuclei, internal capsule, insula, M1-M3 cortex): 7 - Supraganglionic infarction (M4-M6 cortex): 3 Total score (0-10 with 10 being normal): 10 IMPRESSION: 1. No acute intracranial abnormality 2. ASPECTS is 10 Electronically Signed: By: Marlan Palau M.D. On: 07/27/2016 12:33    Procedures Procedures (including critical care time)  Medications Ordered in ED Medications  sodium chloride 0.9 % bolus 500 mL (0 mLs Intravenous Stopped 07/27/16 1521)     Initial Impression / Assessment and Plan / ED Course  I have reviewed the triage vital signs and the nursing notes.  Pertinent labs & imaging results that were available during my care of the patient were reviewed by me and considered in my medical decision making (see chart for details).  Clinical Course as of Jul 27 1541  Thu Jul 27, 2016  1204 Family and staff called me to bedside. Slurred speech has returned. Patient is slurring her words and tells me she does not feel right. She has good and  equal grip strengths. She denies headache. No facial droop. Will activate code stroke.   [WD]    Clinical Course User Index [WD] Everlene Farrier, PA-C    This  is a 83 y.o. Female with a history of diabetes and peripheral vascular disease who presents to the ED via EMS complaining of episodic weakness earlier today. Patient reports she was trying to organize her pills when she felt both of her arms felt weak and useless, she had drooling from her mouth and slurred speech. She reports this lasted for about 5 mins and resolved on its own. Home health nurse also noticed the slurred speech. On arrival to the ER the patient reports she is feeling back to normal. After initial interview patient's family reports she is slurring her speech again around 12:05 pm. She tells me she feels something is not right and her vision seems "off." She was seen in the emergency department 3 weeks ago and his workup for cardiac events. She is currently wearing a LifeWatch heart monitor. No cardiac events today, per [facility] department. She reports feeling room spinning dizziness earlier today.  On my initial exam the patient is afebrile nontoxic appearing. She has no focal neurological deficits. She is alert and oriented. Speech is clear and coherent. She is pleasant and cooperative. Shortly after I was called back to bedside as the patient is having slurred speech. On exam she has slurred speech. No focal weakness on my exam. Code stroke was activated. She returned to baseline after code stroke was activated.  Blood work is remarkable for glucose of 253 with normal anion gap and a creatinine of 1.17 with a GFR of 45. CT head without contrast showed no acute findings. Chest x-ray also unremarkable. Neurology to see the patient who would like the patient admitted by medicine for stroke workup. Patient and family agree with plan for admission. Patient reports feeling at baseline at recheck.   I consulted with internal medicine  teaching service who accepted the patient for admission.   This patient was discussed with and evaluated by Dr. Eudelia Bunch who agrees with assessment and plan.    Final Clinical Impressions(s) / ED Diagnoses   Final diagnoses:  Slurred speech  Weakness of both arms    New Prescriptions New Prescriptions   No medications on file     Everlene Farrier, PA-C  07/27/16 1547    Nira Conn, MD 07/27/16 [phone]

## 2016-07-27 NOTE — ED Notes (Signed)
Moved patient up in bed patient is resting

## 2016-07-28 ENCOUNTER — Observation Stay (HOSPITAL_COMMUNITY): Payer: Medicare Other

## 2016-07-28 ENCOUNTER — Observation Stay (HOSPITAL_BASED_OUTPATIENT_CLINIC_OR_DEPARTMENT_OTHER): Payer: Medicare Other

## 2016-07-28 ENCOUNTER — Encounter (HOSPITAL_COMMUNITY): Payer: Self-pay | Admitting: *Deleted

## 2016-07-28 DIAGNOSIS — I10 Essential (primary) hypertension: Secondary | ICD-10-CM | POA: Diagnosis not present

## 2016-07-28 DIAGNOSIS — E1139 Type 2 diabetes mellitus with other diabetic ophthalmic complication: Secondary | ICD-10-CM

## 2016-07-28 DIAGNOSIS — H409 Unspecified glaucoma: Secondary | ICD-10-CM | POA: Diagnosis present

## 2016-07-28 DIAGNOSIS — Z87891 Personal history of nicotine dependence: Secondary | ICD-10-CM

## 2016-07-28 DIAGNOSIS — I951 Orthostatic hypotension: Secondary | ICD-10-CM | POA: Diagnosis present

## 2016-07-28 DIAGNOSIS — Z7982 Long term (current) use of aspirin: Secondary | ICD-10-CM | POA: Diagnosis not present

## 2016-07-28 DIAGNOSIS — Z885 Allergy status to narcotic agent status: Secondary | ICD-10-CM

## 2016-07-28 DIAGNOSIS — Z79899 Other long term (current) drug therapy: Secondary | ICD-10-CM

## 2016-07-28 DIAGNOSIS — R1310 Dysphagia, unspecified: Secondary | ICD-10-CM | POA: Diagnosis present

## 2016-07-28 DIAGNOSIS — I129 Hypertensive chronic kidney disease with stage 1 through stage 4 chronic kidney disease, or unspecified chronic kidney disease: Secondary | ICD-10-CM

## 2016-07-28 DIAGNOSIS — I6522 Occlusion and stenosis of left carotid artery: Secondary | ICD-10-CM | POA: Diagnosis not present

## 2016-07-28 DIAGNOSIS — I6350 Cerebral infarction due to unspecified occlusion or stenosis of unspecified cerebral artery: Secondary | ICD-10-CM | POA: Diagnosis present

## 2016-07-28 DIAGNOSIS — Z888 Allergy status to other drugs, medicaments and biological substances status: Secondary | ICD-10-CM

## 2016-07-28 DIAGNOSIS — E1122 Type 2 diabetes mellitus with diabetic chronic kidney disease: Secondary | ICD-10-CM | POA: Diagnosis present

## 2016-07-28 DIAGNOSIS — R4781 Slurred speech: Secondary | ICD-10-CM

## 2016-07-28 DIAGNOSIS — I63212 Cerebral infarction due to unspecified occlusion or stenosis of left vertebral arteries: Secondary | ICD-10-CM | POA: Diagnosis present

## 2016-07-28 DIAGNOSIS — Z883 Allergy status to other anti-infective agents status: Secondary | ICD-10-CM

## 2016-07-28 DIAGNOSIS — Z89611 Acquired absence of right leg above knee: Secondary | ICD-10-CM | POA: Diagnosis not present

## 2016-07-28 DIAGNOSIS — I638 Other cerebral infarction: Secondary | ICD-10-CM | POA: Diagnosis not present

## 2016-07-28 DIAGNOSIS — Z886 Allergy status to analgesic agent status: Secondary | ICD-10-CM

## 2016-07-28 DIAGNOSIS — M792 Neuralgia and neuritis, unspecified: Secondary | ICD-10-CM | POA: Diagnosis not present

## 2016-07-28 DIAGNOSIS — I2510 Atherosclerotic heart disease of native coronary artery without angina pectoris: Secondary | ICD-10-CM | POA: Diagnosis not present

## 2016-07-28 DIAGNOSIS — Z89412 Acquired absence of left great toe: Secondary | ICD-10-CM | POA: Diagnosis not present

## 2016-07-28 DIAGNOSIS — Z818 Family history of other mental and behavioral disorders: Secondary | ICD-10-CM

## 2016-07-28 DIAGNOSIS — R4701 Aphasia: Secondary | ICD-10-CM | POA: Diagnosis present

## 2016-07-28 DIAGNOSIS — Z880 Allergy status to penicillin: Secondary | ICD-10-CM

## 2016-07-28 DIAGNOSIS — G459 Transient cerebral ischemic attack, unspecified: Secondary | ICD-10-CM

## 2016-07-28 DIAGNOSIS — F329 Major depressive disorder, single episode, unspecified: Secondary | ICD-10-CM

## 2016-07-28 DIAGNOSIS — I639 Cerebral infarction, unspecified: Secondary | ICD-10-CM | POA: Diagnosis not present

## 2016-07-28 DIAGNOSIS — E1140 Type 2 diabetes mellitus with diabetic neuropathy, unspecified: Secondary | ICD-10-CM

## 2016-07-28 DIAGNOSIS — E1142 Type 2 diabetes mellitus with diabetic polyneuropathy: Secondary | ICD-10-CM | POA: Diagnosis present

## 2016-07-28 DIAGNOSIS — Z881 Allergy status to other antibiotic agents status: Secondary | ICD-10-CM

## 2016-07-28 DIAGNOSIS — R471 Dysarthria and anarthria: Secondary | ICD-10-CM | POA: Diagnosis present

## 2016-07-28 DIAGNOSIS — E1151 Type 2 diabetes mellitus with diabetic peripheral angiopathy without gangrene: Secondary | ICD-10-CM | POA: Diagnosis present

## 2016-07-28 DIAGNOSIS — Z833 Family history of diabetes mellitus: Secondary | ICD-10-CM

## 2016-07-28 DIAGNOSIS — N183 Chronic kidney disease, stage 3 (moderate): Secondary | ICD-10-CM | POA: Diagnosis present

## 2016-07-28 DIAGNOSIS — J45909 Unspecified asthma, uncomplicated: Secondary | ICD-10-CM

## 2016-07-28 DIAGNOSIS — E785 Hyperlipidemia, unspecified: Secondary | ICD-10-CM | POA: Diagnosis present

## 2016-07-28 DIAGNOSIS — R4702 Dysphasia: Secondary | ICD-10-CM | POA: Diagnosis present

## 2016-07-28 LAB — LIPID PANEL
CHOL/HDL RATIO: 6.3 ratio
CHOLESTEROL: 183 mg/dL (ref 0–200)
HDL: 29 mg/dL — ABNORMAL LOW (ref >40)
LDL CALC: 120 mg/dL — AB (ref 0–99)
Triglycerides: 171 mg/dL — ABNORMAL HIGH (ref <150)
VLDL: 34 mg/dL (ref 0–40)

## 2016-07-28 LAB — GLUCOSE, CAPILLARY
Glucose-Capillary: 115 mg/dL — ABNORMAL HIGH (ref 65–99)
Glucose-Capillary: 96 mg/dL (ref 65–99)
Glucose-Capillary: 115 mg/dL — ABNORMAL HIGH (ref 65–99)
Glucose-Capillary: 130 mg/dL — ABNORMAL HIGH (ref 65–99)
Glucose-Capillary: 143 mg/dL — ABNORMAL HIGH (ref 65–99)

## 2016-07-28 LAB — ECHOCARDIOGRAM COMPLETE
HEIGHTINCHES: 64 in
Weight: 2163.2 oz

## 2016-07-28 MED ORDER — ASPIRIN-DIPYRIDAMOLE ER 25-200 MG PO CP12
1.0000 | ORAL_CAPSULE | Freq: Every day | ORAL | Status: DC
  Administered 2016-07-28 – 2016-07-30 (×3): 1 via ORAL
  Filled 2016-07-28 (×3): qty 1

## 2016-07-28 MED ORDER — ASPIRIN EC 81 MG PO TBEC
81.0000 mg | DELAYED_RELEASE_TABLET | Freq: Every day | ORAL | Status: DC
  Administered 2016-07-29 – 2016-07-31 (×3): 81 mg via ORAL
  Filled 2016-07-28 (×3): qty 1

## 2016-07-28 MED ORDER — ROSUVASTATIN CALCIUM 5 MG PO TABS
20.0000 mg | ORAL_TABLET | Freq: Every day | ORAL | Status: DC
  Administered 2016-07-28 – 2016-07-30 (×3): 20 mg via ORAL
  Filled 2016-07-28 (×3): qty 4

## 2016-07-28 NOTE — Consult Note (Signed)
New Carotid Patient   Referred by:  [facility], Dr. Tasia Catchings   Reason for referral: L carotid stenosis  History of Present Illness  Carolyn Werner is a 83 y.o. (06-21-1921) female with multiple active co-morbidities s/p R AKA who presents with chief complaint: slurred speech.  Pt was found yesterday to be slurring her speech.  She was sent subsequent to the ED.  Work-up including Head MRI demonstrates bilateral embolic strokes.  She recently had a B carotid duplex: RICA 1-39% stenosis, LICA 80-99% stenosis.  Patient has no prior history of TIA or stroke symptom.  The patient has never had amaurosis fugax or monocular blindness.  The patient has never had facial drooping or hemiplegia.  The patient has had recurrent expressive aphasia.   The patient's previous neurologic deficits have mostly resolved but she continues to have some difficulty with word finding.  She has had at least a couple of episodes of speech slow down since being admitted.  The patient's risks factors for carotid disease include: DM and prior smoking.    Past Medical History:  Diagnosis Date  . Diabetes mellitus without complication ([name])   . Headache   . Hx of benign neoplasm of pituitary gland   . PVD (peripheral vascular disease) ([name])   . Shingles   Reported CKD stage III  Past Surgical History:  Procedure Laterality Date  . ABOVE KNEE LEG AMPUTATION Right     Social History   Social History  . Marital status: Unknown    Spouse name: N/A  . Number of children: N/A  . Years of education: N/A   Occupational History  . Not on file.   Social History Main Topics  . Smoking status: Former Smoker    Packs/day: 0.25    Years: 23.00    Quit date: 06/05/1973  . Smokeless tobacco: Never Used  . Alcohol use No  . Drug use: No  . Sexual activity: No   Other Topics Concern  . Not on file   Social History Narrative  . No narrative on file    Family History  Problem  Relation Age of Onset  . Dementia    . Hypertension Daughter     Current Facility-Administered Medications  Medication Dose Route Frequency Provider Last Rate Last Dose  . 0.9 %  sodium chloride infusion   Intravenous Continuous Valentino Nose, MD 75 mL/hr at 07/27/16 2033    . acetaminophen (TYLENOL) tablet 650 mg  650 mg Oral Q4H PRN Valentino Nose, MD   650 mg at 07/28/16 5366   Or  . acetaminophen (TYLENOL) solution 650 mg  650 mg Per Tube Q4H PRN Valentino Nose, MD       Or  . acetaminophen (TYLENOL) suppository 650 mg  650 mg Rectal Q4H PRN Valentino Nose, MD      . albuterol (PROVENTIL) (2.5 MG/3ML) 0.083% nebulizer solution 2.5 mg  2.5 mg Nebulization Q4H PRN Valentino Nose, MD      . Melene Muller ON 07/29/2016] aspirin EC tablet 81 mg  81 mg Oral Daily Tasrif Ahmed, MD       And  . dipyridamole-aspirin (AGGRENOX) 200-25 MG per 12 hr capsule 1 capsule  1 capsule Oral QHS Tasrif Ahmed, MD      . budesonide (PULMICORT) nebulizer solution 0.25 mg  0.25 mg Nebulization BID Valentino Nose, MD   0.25 mg at 07/27/16 1932  . docusate sodium (COLACE) capsule 200 mg  200 mg Oral Daily PRN Harrold Donath  Karma Greaser, MD      . DULoxetine (CYMBALTA) DR capsule 30 mg  30 mg Oral Daily Valentino Nose, MD   30 mg at 07/28/16 0858  . enoxaparin (LOVENOX) injection 30 mg  30 mg Subcutaneous Q24H Valentino Nose, MD   30 mg at 07/27/16 1810  . gabapentin (NEURONTIN) capsule 100 mg  100 mg Oral q morning - 10a Valentino Nose, MD   100 mg at 07/28/16 1610   And  . gabapentin (NEURONTIN) capsule 200 mg  200 mg Oral QHS Valentino Nose, MD   200 mg at 07/27/16 2204  . insulin aspart (novoLOG) injection 0-9 Units  0-9 Units Subcutaneous TID WC Valentino Nose, MD      . latanoprost (XALATAN) 0.005 % ophthalmic solution 1 drop  1 drop Both Eyes QHS Valentino Nose, MD   1 drop at 07/27/16 2203  . polyvinyl alcohol (LIQUIFILM TEARS) 1.4 % ophthalmic solution 1 drop  1 drop Both Eyes BID PRN Valentino Nose, MD      . rosuvastatin  (CRESTOR) tablet 20 mg  20 mg Oral q1800 Tasrif Ahmed, MD      . sodium chloride (OCEAN) 0.65 % nasal spray 1 spray  1 spray Each Nare Daily PRN Valentino Nose, MD        Allergies  Allergen Reactions  . Atacand Hct [Candesartan Cilexetil-Hctz] Other (See Comments)    Kidney disease  . Carbamazepine Other (See Comments)    Changes in ability to read, think clearly, unsteady gait  . Erythromycin Diarrhea  . Glucophage [Metformin Hcl] Other (See Comments)    Chronic low blood sugar   . Lipitor [Atorvastatin] Other (See Comments)    Muscle weakness  . Nsaids Nausea And Vomiting    Motrin, Naprosyn, Voltaren, Celebrex, Prevacid, Mobic -- abdominal pain and burning, cramps, loose stool  . Sulfa Antibiotics Anaphylaxis  . Ceclor [Cefaclor] Hives  . Clindamycin/Lincomycin Other (See Comments)    Abdominal pain, loss of appetite  . Dilaudid [Hydromorphone Hcl] Rash  . Penicillins Rash  . Plavix [Clopidogrel] Other (See Comments)     REVIEW OF SYSTEMS:   Cardiac:  positive for: arrhythmia: wearing Holter before admission, negative for: Chest pain or chest pressure, Shortness of breath upon exertion and Shortness of breath when lying flat,   Vascular:  positive for: no symptoms,  negative for: Pain in calf, thigh, or hip brought on by ambulation, Pain in feet at night that wakes you up from your sleep, Blood clot in your veins and Leg swelling  Pulmonary:  positive for: no symptoms,  negative for: Oxygen at home, Productive cough and Wheezing  Neurologic:  positive for: Sudden onset of difficulty speaking or slurred speech, negative for: Sudden weakness in arms or legs, Sudden numbness in arms or legs, Temporary loss of vision in one eye and Problems with dizziness  Gastrointestinal:  positive for: no symptoms, negative for: Blood in stool and Vomited blood  Genitourinary:  positive for: no symptoms, negative for: Burning when urinating and Blood in urine  Psychiatric:    positive for: no symptoms,  negative for: Major depression  Hematologic:  positive for: no symptoms,  negative for: negative for: Bleeding problems and Problems with blood clotting too easily  Dermatologic:  positive for: no symptoms, negative for: Rashes or ulcers  Constitutional:  positive for: no symptoms, negative for: Fever or chills  Ear/Nose/Throat:  positive for: no symptoms, negative for: Change in hearing, Nose bleeds and Sore throat  Musculoskeletal:  positive for: no  symptoms, negative for: Back pain, Joint pain and Muscle pain   For VQI Use Only  PRE-ADM LIVING: Nursing home  AMB STATUS: Wheelchair  CAD Sx: None  PRIOR CHF: None  STRESS TEST: No   Physical Examination  Vitals:   07/28/16 0200 07/28/16 0400 07/28/16 0513 07/28/16 1009  BP: (!) 148/65 (!) 143/72 139/72 (!) 148/58  Pulse: 72 73 72 76  Resp: 18 18 18 16   Temp:   97.6 F (36.4 C) 97.7 F (36.5 C)  TempSrc:   Oral Oral  SpO2: 98% 98% 98% 96%  Weight:      Height:        Body mass index is 23.21 kg/m.  General: Alert, O x 3, WD,elderly and frail  Head: [place]/AT,   Ear/Nose/Throat: Hearing grossly decreased, nares without erythema or drainage, oropharynx without Erythema without Exudate , Mallampati score: 3, dentures  Eyes: PERRLA, EOMI,   Neck: Supple, mid-line trachea,    Pulmonary: Sym exp, good B air movt,CTA B, GI sounds in chest  Cardiac: RRR, Nl S1, S2, faintly aortic murmur, No rubs, No S3,S4  Vascular: Vessel Right Left  Radial Palpable Palpable  Brachial Palpable Palpable  Carotid Palpable, No Bruit Palpable, No Bruit  Aorta Not palpable N/A  Femoral Palpable Palpable  Popliteal AKA Not palpable  PT AKA Not palpable  DP AKA Not palpable   Gastrointestinal: soft, distended, non-tender to palpation, No guarding or rebound, no HSM, no masses, no CVAT B, No palpable prominent aortic pulse,    Musculoskeletal: BUE M/S 5/5 throughout able to raise LLE, R AKA,  Extremities without ischemic changes except R AKA, No edema present, L foot without ulcers or gangrene  Neurologic: CN 2-12 intact, Pain and light touch intact in extremities, Motor exam as listed above, small delay in speech noteable  Psychiatric: Judgement intact, Mood & affect appropriate for pt's clinical situation  Dermatologic: See M/S exam for extremity exam, No rashes otherwise noted  Lymph : Palpable lymph nodes: None   Non-Invasive Vascular Imaging  CAROTID DUPLEX (Date: 07/28/2016):   R ICA stenosis: 1-39%  R VA: not visualized  L ICA stenosis: 80-99%  L VA: poor visualized with dampened flow  Laboratory: CBC:    Component Value Date/Time   WBC 6.3 07/27/2016 1111   RBC 4.34 07/27/2016 1111   HGB 12.4 07/27/2016 1111   HCT 38.1 07/27/2016 1111   PLT 139 (L) 07/27/2016 1111   MCV 87.8 07/27/2016 1111   MCH 28.6 07/27/2016 1111   MCHC 32.5 07/27/2016 1111   RDW 12.5 07/27/2016 1111   LYMPHSABS 1.6 07/27/2016 1111   MONOABS 0.3 07/27/2016 1111   EOSABS 0.1 07/27/2016 1111   BASOSABS 0.0 07/27/2016 1111    BMP:    Component Value Date/Time   NA 136 07/27/2016 1111   K 4.1 07/27/2016 1111   CL 105 07/27/2016 1111   CO2 24 07/27/2016 1111   GLUCOSE 253 (H) 07/27/2016 1111   BUN 15 07/27/2016 1111   CREATININE 1.17 (H) 07/27/2016 1111   CALCIUM 10.0 07/27/2016 1111   GFRNONAA 39 (L) 07/27/2016 1111   GFRAA 45 (L) 07/27/2016 1111    Coagulation: Lab Results  Component Value Date   INR 1.12 07/27/2016   No results found for: PTT  Lipids:    Component Value Date/Time   CHOL 183 07/28/2016 0229   TRIG 171 (H) 07/28/2016 0229   HDL 29 (L) 07/28/2016 0229   CHOLHDL 6.3 07/28/2016 0229   VLDL 34 07/28/2016  0229   LDLCALC 120 (H) 07/28/2016 0229     Radiology: Dg Chest 2 View  Result Date: 07/27/2016 CLINICAL DATA:  Weakness EXAM: CHEST  2 VIEW COMPARISON:  July 03, 2016 FINDINGS: There is scarring in the left base. There is no edema or  consolidation. Heart size and pulmonary vascularity are normal. No adenopathy. There is atherosclerotic calcification aorta. There is degenerative change in the thoracic spine. IMPRESSION: Scarring left base. No edema or consolidation. Stable cardiac silhouette. There is aortic atherosclerosis. Electronically Signed   By: Bretta BangWilliam  Woodruff III M.D.   On: 07/27/2016 13:52   Mr Brain Wo Contrast  Result Date: 07/27/2016 CLINICAL DATA:  Slurred speech and bilateral upper extremity weakness EXAM: MRI HEAD WITHOUT CONTRAST MRA HEAD WITHOUT CONTRAST TECHNIQUE: Multiplanar, multiecho pulse sequences of the brain and surrounding structures were obtained without intravenous contrast. Angiographic images of the head were obtained using MRA technique without contrast. COMPARISON:  Head CT 07/27/2016 FINDINGS: MRI HEAD FINDINGS Brain: There are small foci of diffusion restriction within the bilateral pons and in both cerebellar hemispheres. There is an old left occipital infarct. Mild findings of chronic microvascular ischemia are present. No mass lesion or midline shift. No hydrocephalus or extra-axial fluid collection. No acute hemorrhage. The midline structures are normal. No age advanced or lobar predominant atrophy. Vascular: Major intracranial arterial and venous sinus flow voids are preserved. No evidence of chronic microhemorrhage or amyloid angiopathy. Skull and upper cervical spine: The visualized skull base, calvarium, upper cervical spine and extracranial soft tissues are normal. Sinuses/Orbits: There is moderate maxillary and ethmoid mucosal thickening. Normal orbits. MRA HEAD FINDINGS The time-of-flight sequence is markedly degraded by motion. There is no flow related enhancement seen within the left posterior cerebral artery. There is no flow related enhancement within the left vertebral artery. The other major intracranial arteries appear patent. IMPRESSION: 1. Multiple small foci of acute ischemia involving  the pons and cerebellum. No mass effect or acute hemorrhage. 2. Motion degraded time-of-flight MRA. Within that limitation, the left PCA and left vertebral artery show no flow related enhancement and may be occluded. Electronically Signed   By: Deatra RobinsonKevin  Herman M.D.   On: 07/27/2016 23:46   Mr Maxine GlennMra Head/brain OZWo Cm  Result Date: 07/27/2016 CLINICAL DATA:  Slurred speech and bilateral upper extremity weakness EXAM: MRI HEAD WITHOUT CONTRAST MRA HEAD WITHOUT CONTRAST TECHNIQUE: Multiplanar, multiecho pulse sequences of the brain and surrounding structures were obtained without intravenous contrast. Angiographic images of the head were obtained using MRA technique without contrast. COMPARISON:  Head CT 07/27/2016 FINDINGS: MRI HEAD FINDINGS Brain: There are small foci of diffusion restriction within the bilateral pons and in both cerebellar hemispheres. There is an old left occipital infarct. Mild findings of chronic microvascular ischemia are present. No mass lesion or midline shift. No hydrocephalus or extra-axial fluid collection. No acute hemorrhage. The midline structures are normal. No age advanced or lobar predominant atrophy. Vascular: Major intracranial arterial and venous sinus flow voids are preserved. No evidence of chronic microhemorrhage or amyloid angiopathy. Skull and upper cervical spine: The visualized skull base, calvarium, upper cervical spine and extracranial soft tissues are normal. Sinuses/Orbits: There is moderate maxillary and ethmoid mucosal thickening. Normal orbits. MRA HEAD FINDINGS The time-of-flight sequence is markedly degraded by motion. There is no flow related enhancement seen within the left posterior cerebral artery. There is no flow related enhancement within the left vertebral artery. The other major intracranial arteries appear patent. IMPRESSION: 1. Multiple small foci of acute  ischemia involving the pons and cerebellum. No mass effect or acute hemorrhage. 2. Motion degraded  time-of-flight MRA. Within that limitation, the left PCA and left vertebral artery show no flow related enhancement and may be occluded. Electronically Signed   By: Deatra Robinson M.D.   On: 07/27/2016 23:46   Ct Head Code Stroke W/o Cm  Addendum Date: 07/27/2016   ADDENDUM REPORT: 07/27/2016 12:43 ADDENDUM: These results were called by telephone at the time of interpretation on 07/27/2016 at 12:43 pm to Dr. Roxy Manns, who verbally acknowledged these results. Electronically Signed   By: Marlan Palau M.D.   On: 07/27/2016 12:43   Result Date: 07/27/2016 CLINICAL DATA:  Code stroke. Left facial numbness and drooling. Slurred speech. Bilateral upper extremity numbness. EXAM: CT HEAD WITHOUT CONTRAST TECHNIQUE: Contiguous axial images were obtained from the base of the skull through the vertex without intravenous contrast. COMPARISON:  None. FINDINGS: Brain: Mild atrophy for age. Negative for hydrocephalus. Negative for acute infarct. Negative for hemorrhage or mass. No shift of the midline structures. Vascular: Moderate atherosclerotic calcification. No hyperdense vessel. Skull: Negative Sinuses/Orbits: Mucoperiosteal thickening of the maxillary and sphenoid sinus bilaterally. Mastoid sinus clear. No orbital lesion. Bilateral lens replacement. Other: None ASPECTS (Alberta Stroke Program Early CT Score) - Ganglionic level infarction (caudate, lentiform nuclei, internal capsule, insula, M1-M3 cortex): 7 - Supraganglionic infarction (M4-M6 cortex): 3 Total score (0-10 with 10 being normal): 10 IMPRESSION: 1. No acute intracranial abnormality 2. ASPECTS is 10 Electronically Signed: By: Marlan Palau M.D. On: 07/27/2016 12:33    Medical Decision Making  Sunnie Odden is a 83 y.o. female who presents with: B CVA, possible LICA stenosis >80%, multiple co-morbidities including reported chronic kidney disease stage 3 and R AKA    I usually confirm a  possible carotid duplex with a CTA Neck but reportedly she has  had previous CKD Stage 3 with reportedly only one functioning kidney.  Subsequently, the patient and family are reluctant to proceed with CTA Neck.  Additionally, the family and patient are not interested in any further surgery since her previous R AKA.  Reportedly she nearly died after that procedure.  As the patient doesn't want any procedures, additional investigations are not needs as the patient will be managed medically anyway.  Will defer to Neurology any further medical mgmt including choice of statin, reported she has had myalgias with Lipitor and Pravachol, leaving only Zocor and Crestor.  Interestingly the patient reported had a TEN type reaction to Plavix, so dual anti-platelets would not be an options.  Available as needed.  Thank you for allowing [place] to participate in this patient's care.   Leonides Sake, MD, FACS [facility] Office: [phone] Pager: [phone]  07/28/2016, 2:37 PM

## 2016-07-28 NOTE — Evaluation (Signed)
Physical Therapy Evaluation Patient Details Name: Carolyn Werner MRN: [id number] DOB: 07/16/1921 Today's Date: 07/28/2016   History of Present Illness  Pt is a 83 year old female with a past medical history including hypertension, type 2 diabetes mellitus, CKD stage III, peripheral vascular disease s/p right leg AKA presented with complaints of slurred speech and bilateral hand heaviness. MRI revealed Multiple small foci of acute ischemia involving the pons and cerebellum.  Clinical Impression  Pt admitted with above diagnosis. Pt currently with functional limitations due to the deficits listed below (see PT Problem List). On eval, pt required mod assist bed mobility and min assist squat pivot transfers. At baseline, pt is mod I with all functional mobility at a w/c level.  Pt will benefit from skilled PT to increase their independence and safety with mobility to allow discharge to the venue listed below.  If pt does not qualify for SNF due to obs status, she at minimum would require 24-hour assist at home and HHPT.     Follow Up Recommendations SNF;Supervision/Assistance - 24 hour    Equipment Recommendations  None recommended by PT    Recommendations for Other Services       Precautions / Restrictions Precautions Precautions: Fall      Mobility  Bed Mobility Overal bed mobility: Needs Assistance Bed Mobility: Supine to Sit     Supine to sit: Mod assist     General bed mobility comments: assist to elevate trunk, use of bed pad to scoot to EOB  Transfers Overall transfer level: Needs assistance Equipment used: None Transfers: Squat Pivot Transfers Sit to Stand: Min assist   Squat pivot transfers: Min assist     General transfer comment: Pt prefers pivot transfers toward right. Assist to stablize initial standing balance. Verbal cues for sequencing.  Ambulation/Gait             General Gait Details: nonambulatory  Stairs            Wheelchair  Mobility    Modified Rankin (Stroke Patients Only) Modified Rankin (Stroke Patients Only) Pre-Morbid Rankin Score: No significant disability Modified Rankin: Moderate disability     Balance Overall balance assessment: Needs assistance Sitting-balance support: No upper extremity supported;Feet supported Sitting balance-Leahy Scale: Fair     Standing balance support: During functional activity;Bilateral upper extremity supported Standing balance-Leahy Scale: Poor                               Pertinent Vitals/Pain Pain Assessment: No/denies pain    Home Living Family/patient expects to be discharged to:: Private residence Living Arrangements: Children Available Help at Discharge: Family;Personal care attendant;Available PRN/intermittently (PCA 3 hours/5 days per week, daughter works during the day) Type of Home: Apartment Home Access: Ramped entrance     Home Layout: One level Home Equipment: Bedside commode;Tub bench;Grab bars - toilet;Grab bars - tub/shower;Wheelchair - power;Hospital bed;Other (comment) (trapeze bar)      Prior Function                 Hand Dominance        Extremity/Trunk Assessment   Upper Extremity Assessment Upper Extremity Assessment: Defer to OT evaluation    Lower Extremity Assessment Lower Extremity Assessment: Generalized weakness    Cervical / Trunk Assessment Cervical / Trunk Assessment: Kyphotic  Communication      Cognition Arousal/Alertness: Awake/alert Behavior During Therapy: WFL for tasks assessed/performed Overall Cognitive Status: Impaired/Different from baseline Area  of Impairment: Memory;Safety/judgement;Problem solving;Attention   Current Attention Level: Selective Memory: Decreased short-term memory   Safety/Judgement: Decreased awareness of safety   Problem Solving: Slow processing;Difficulty sequencing;Requires verbal cues      General Comments      Exercises     Assessment/Plan     PT Assessment Patient needs continued PT services  PT Problem List Decreased strength;Decreased activity tolerance;Decreased balance;Decreased mobility;Decreased cognition;Decreased knowledge of precautions;Decreased safety awareness       PT Treatment Interventions Functional mobility training;Therapeutic activities;Therapeutic exercise;Balance training;Neuromuscular re-education;Patient/family education;Cognitive remediation    PT Goals (Current goals can be found in the Care Plan section)  Acute Rehab PT Goals Patient Stated Goal: get stronger at rehab to go home PT Goal Formulation: With patient/family Time For Goal Achievement: 08/11/16 Potential to Achieve Goals: Good    Frequency Min 3X/week   Barriers to discharge        Co-evaluation               End of Session Equipment Utilized During Treatment: Gait belt Activity Tolerance: Patient tolerated treatment well Patient left: in chair;with chair alarm set;with call bell/phone within reach;with family/visitor present Nurse Communication: Mobility status PT Visit Diagnosis: Other abnormalities of gait and mobility (R26.89)    Functional Assessment Tool Used: AM-PAC 6 Clicks Basic Mobility Functional Limitation: Mobility: Walking and moving around Mobility: Walking and Moving Around Current Status (E4540): At least 40 percent but less than 60 percent impaired, limited or restricted Mobility: Walking and Moving Around Goal Status [phone]): At least 20 percent but less than 40 percent impaired, limited or restricted    Time: 0903-0939 PT Time Calculation (min) (ACUTE ONLY): 36 min   Charges:   PT Evaluation $PT Eval Moderate Complexity: 1 Procedure PT Treatments $Therapeutic Activity: 8-22 mins   PT G Codes:   PT G-Codes **NOT FOR INPATIENT CLASS** Functional Assessment Tool Used: AM-PAC 6 Clicks Basic Mobility Functional Limitation: Mobility: Walking and moving around Mobility: Walking and Moving Around  Current Status (J4782): At least 40 percent but less than 60 percent impaired, limited or restricted Mobility: Walking and Moving Around Goal Status [phone]): At least 20 percent but less than 40 percent impaired, limited or restricted     Ilda Foil 07/28/2016, 10:07 AM

## 2016-07-28 NOTE — Evaluation (Cosign Needed)
Speech Language Pathology Evaluation Patient Details Name: Carolyn Werner MRN: [id number] DOB: 01-24-1922 Today's Date: 07/28/2016 Time: 4098-1191 SLP Time Calculation (min) (ACUTE ONLY): 35 min  Problem List:  Patient Active Problem List   Diagnosis Date Noted  . Slurred speech 07/27/2016  . CKD (chronic kidney disease) stage 3, GFR 30-59 ml/min 07/27/2016  . Diabetes mellitus ([name]) 07/27/2016  . Hypertension 07/27/2016  . Depression 07/27/2016  . Glaucoma 07/27/2016  . Neuropathic pain 07/27/2016  . Asthma 07/27/2016  . Heart palpitations 07/05/2016  . Cardiac murmur 07/05/2016  . Phantom limb pain ([name]) 11/13/2014  . Cervical disc disorder with radiculopathy of cervical region 11/13/2014   Past Medical History:  Past Medical History:  Diagnosis Date  . Diabetes mellitus without complication ([name])   . Headache   . Hx of benign neoplasm of pituitary gland   . PVD (peripheral vascular disease) ([name])   . Shingles    Past Surgical History:  Past Surgical History:  Procedure Laterality Date  . ABOVE KNEE LEG AMPUTATION Right    HPI:  Pt is 83 year old female with PMH including hypertension, type 2 diabetes mellitus, CKD stage III, peripheral vascular disease s/p right leg AKA. Presented with complaints of slurred speech and bilateral hand heaviness. She does complain of difficulty swallowing both liquids and solids over the past 2-3 months. Denies any pain with swallowing, rather feels that food get stuck in her throat. MRI of brain showed multiple small foci of acute ischemia involving the pons and cerebellum. No mass effect or acute hemorrhage.   Assessment / Plan / Recommendation Clinical Impression  Carolyn Werner was alert and oriented X4. Pt and daughter reported slurred, slow speech and mild memory deficits. Carolyn Werner lives with her daughter who provides assistance around the home. Administered portions of the Cognistat to assess language and cognition; mild  impairments noted in delayed recall/memory and repetition, however performance was functional for age and daily requirements. Educated pt and daughter re: strategies to utilize for memory (visual aids) and advised pt to increase volume, overarticulate, and slow rate to increase her intelligibility. ST will f/u for a bedside swallow eval given hx of stroke and pt/daughter's reports of difficulty swallowing. Continued speech therapy for language/cognition is not recommended at this time.    SLP Assessment  SLP Recommendation/Assessment: Patient does not need any further Speech Lanaguage Pathology Services SLP Visit Diagnosis: Cognitive communication deficit (R41.841)    Follow Up Recommendations  Other (comment) (TBD)    Frequency and Duration           SLP Evaluation Cognition  Overall Cognitive Status: Impaired/Different from baseline Arousal/Alertness: Awake/alert Orientation Level: Oriented X4 Attention: Sustained Sustained Attention: Appears intact Memory: Impaired Memory Impairment: Storage deficit;Retrieval deficit;Decreased short term memory Decreased Short Term Memory: Verbal basic Awareness: Appears intact Problem Solving: Appears intact Safety/Judgment: Appears intact       Comprehension  Auditory Comprehension Overall Auditory Comprehension: Appears within functional limits for tasks assessed Yes/No Questions: Not tested Commands: Within Functional Limits Conversation: Complex Visual Recognition/Discrimination Discrimination: Not tested Reading Comprehension Reading Status: Not tested    Expression Expression Primary Mode of Expression: Verbal Verbal Expression Overall Verbal Expression: Appears within functional limits for tasks assessed Initiation: No impairment Level of Generative/Spontaneous Verbalization: Conversation Repetition: Impaired Level of Impairment: Sentence level Naming: Not tested Pragmatics: No impairment Written Expression Written  Expression: Not tested   Oral / Motor  Oral Motor/Sensory Function Overall Oral Motor/Sensory Function: Within functional limits Motor Speech Overall Motor Speech:  Impaired Respiration: Within functional limits Phonation: Normal Resonance: Within functional limits Articulation: Impaired Level of Impairment: Sentence Intelligibility: Intelligibility reduced Word: 75-100% accurate Phrase: 75-100% accurate Sentence: 75-100% accurate Conversation: 75-100% accurate Motor Planning: Witnin functional limits Motor Speech Errors: Not applicable Effective Techniques: Slow rate;Increased vocal intensity;Over-articulate   GO                    Macarthur CritchleyMeredith Phiona Werner , Student-SLP 07/28/2016, 12:10 PM

## 2016-07-28 NOTE — Progress Notes (Addendum)
   Subjective:  MRI confirmed acute ischemic stroke. Patient has had intermittent slurred speech. No weakness. Denies any complaints.   Objective:  Vital signs in last 24 hours: Vitals:   07/28/16 0200 07/28/16 0400 07/28/16 0513 07/28/16 1009  BP: (!) 148/65 (!) 143/72 139/72 (!) 148/58  Pulse: 72 73 72 76  Resp: 18 18 18 16   Temp:   97.6 F (36.4 C) 97.7 F (36.5 C)  TempSrc:   Oral Oral  SpO2: 98% 98% 98% 96%  Weight:      Height:       Vitals reviewed. General: resting in bed, NAD, HOH,  HEENT: PERRL, EOMI, no scleral icterus Cardiac: RRR, no rubs, murmurs or gallops Pulm: clear to auscultation bilaterally, no wheezes, rales, or rhonchi Abd: soft, nontender, nondistended, BS present Ext: warm and well perfused, no pedal edema Neuro: alert and oriented X3, cranial nerves II-XII grossly intact, strength and sensation to light touch equal in bilateral upper and lower extremities   Assessment/Plan:  Principal Problem:   Slurred speech Active Problems:   CKD (chronic kidney disease) stage 3, GFR 30-59 ml/min   Diabetes mellitus ([name])   Hypertension   Depression   Glaucoma   Neuropathic pain   Asthma   Cerebral infarction ([name])  CVA -acute ischemic infarct   presented with slurred speech and b/l hand weakness which lasted 5 mins but had slurred speech again in the ED.  MRI brain showed multiple small foci of acute ischemic involving the pons and cerebellum,  MRA brain showed no flow in the left PCA and left vertebral artery. Carotid doppler showed left 80-99% ICA stenosis. ECHO pending. hgba1c pending, LDL 120.  PT recommended SNF. Daughter is agreeable, patient reluctantly agreed. Patient had severe allergic reaction to plavix, including skin peeling off in the past. Has intolerance to lipitor - appreciate Neuro recs - asked vascular surgery to evaluate her for her symptomatic intracranial vertebral artery occlusion and asymptomatic high grade extracranial left  carotid stenosis. May not be a good surgical candidate given her advanced age.  - switch asa 325 mg to asa 81mg  in AM and aggrenox daily in PMx14 days. - try crestor 20mg  daily - social work consult for SNF   HTN - allow permissive HTN, hold amlodipine and metop.  DM II - diet controlled - SSI while here  Depression - cont dulextine  Neuropathic pain - stable, cont gabapentin   Dispo: Anticipated discharge in approximately 1-2 day pending SNF placement.   Carolyn Meekerasrif Jaiyon Wander, MD 07/28/2016, 1:56 PM Pager: [phone]

## 2016-07-28 NOTE — Care Management Note (Signed)
Case Management Note  Patient Details  Name: Carolyn Werner MRN: [id number] Date of Birth: 06/12/1921  Subjective/Objective:   Pt admitted with CVA. She is from home with family.                  Action/Plan: PT recommending SNF. Awaiting OT recs. CM following for d/c disposition.   Expected Discharge Date:                  Expected Discharge Plan:  Skilled Nursing Facility  In-House Referral:  Clinical Social Work  Discharge planning Services     Post Acute Care Choice:    Choice offered to:     DME Arranged:    DME Agency:     HH Arranged:    HH Agency:     Status of Service:  In process, will continue to follow  If discussed at Long Length of Stay Meetings, dates discussed:    Additional Comments:  Kermit BaloKelli F Arohi Salvatierra, RN 07/28/2016, 12:43 PM

## 2016-07-28 NOTE — Progress Notes (Signed)
Subjective: Carolyn Werner is doing well this morning. She has had no recurrence of her symptoms from the prior day. MRI overnight was read as multiple small acute infarcts in the cerebellum and pons. MRA noted generally high burden of vascular disease with no observable flow in the left PCA or vertebral artery. Preliminary finding on carotid doppler shows left ICA 80-99% stenosis.   Objective: Vital signs in last 24 hours: Vitals:   07/28/16 0200 07/28/16 0400 07/28/16 0513 07/28/16 1009  BP: (!) 148/65 (!) 143/72 139/72 (!) 148/58  Pulse: 72 73 72 76  Resp: 18 18 18 16   Temp:   97.6 F (36.4 C) 97.7 F (36.5 C)  TempSrc:   Oral Oral  SpO2: 98% 98% 98% 96%  Weight:      Height:       Weight change:   Intake/Output Summary (Last 24 hours) at 07/28/16 1111 Last data filed at 07/28/16 0440  Gross per 24 hour  Intake           1287.5 ml  Output                0 ml  Net           1287.5 ml   BP (!) 148/58 (BP Location: Right Arm)   Pulse 76   Temp 97.7 F (36.5 C) (Oral)   Resp 16   Ht 5\' 4"  (1.626 m)   Wt 61.3 kg (135 lb 3.2 oz)   SpO2 96%   BMI 23.21 kg/m   General Appearance:    Alert, cooperative, no distress, appears stated age  Lungs:     Clear to auscultation bilaterally, respirations unlabored   Heart:    Regular rate and rhythm, S1 and S2 normal, no murmur, rub   or gallop  Abdomen:     Soft, non-tender, bowel sounds active all four quadrants,    no masses, no organomegaly  Extremities:   Prior right AKA, well healed. Extremities otherwise normal, atraumatic, no cyanosis or edema  Neurologic:   CNII-XII intact, subtle right-sided weakness, normal sensation and reflexes throughout   Lab Results: CBG:  Recent Labs Lab 07/27/16 1644 07/27/16 2001 07/27/16 2339 07/28/16 0325 07/28/16 0753  GLUCAP 97 103* 183* 115* 96   Hemoglobin A1C: Pending  Fasting Lipid Panel:  Recent Labs Lab 07/28/16 0229  CHOL 183  HDL 29*  LDLCALC 120*  TRIG 171*    CHOLHDL 6.3   Studies/Results: Mr Brain Wo Contrast  Result Date: 07/27/2016  EXAM: MRI HEAD WITHOUT CONTRAST MRA HEAD WITHOUT CONTRAST  IMPRESSION: 1. Multiple small foci of acute ischemia involving the pons and cerebellum. No mass effect or acute hemorrhage. 2. Motion degraded time-of-flight MRA. Within that limitation, the left PCA and left vertebral artery show no flow related enhancement and may be occluded.   Medications: I have reviewed the patient's current medications. Scheduled Meds: . aspirin  300 mg Rectal Daily   Or  . aspirin  325 mg Oral Daily  . budesonide  0.25 mg Nebulization BID  . DULoxetine  30 mg Oral Daily  . enoxaparin (LOVENOX) injection  30 mg Subcutaneous Q24H  . gabapentin  100 mg Oral q morning - 10a   And  . gabapentin  200 mg Oral QHS  . insulin aspart  0-9 Units Subcutaneous TID WC  . latanoprost  1 drop Both Eyes QHS   Continuous Infusions: . sodium chloride 75 mL/hr at 07/27/16 2033   PRN Meds:.acetaminophen **OR** acetaminophen (TYLENOL)  oral liquid 160 mg/5 mL **OR** acetaminophen, albuterol, docusate sodium, polyvinyl alcohol, sodium chloride Assessment/Plan: Principal Problem:   Slurred speech Active Problems:   CKD (chronic kidney disease) stage 3, GFR 30-59 ml/min   Diabetes mellitus ([name])   Hypertension   Depression   Glaucoma   Neuropathic pain   Asthma  CVA: Patient presented with sudden-onset slurred speech and bilateral hand weakness lasting 5 minutes. Had witness repeat episode of slurred speech in ED concerning for possible continued CVA. MRI/MRA showed multiple acute infarcts in cerebellum and pons with high burden on vascular disease and no observable flow in left PCA or left vertebral. Preliminary read on carotid doppler shows left ICA 80-99% stenosis. Notified vascular surgery about these finds and they will see the patient. Neurology recommends dual antiplatelet therapy for 3 months. Patient previously developed rash on Plavix.  Neurology recommends aspirin in the morning, Aggrenox in the evening for 2 weeks, followed by Aggrenox BID. Tot cholesterol 183 and LDL 120. Reports prior muscle weakness on Lipitor. - Echo pending, carotid doppelers - A1c pending - Aspirin 81 qAM, Aggrenox qHS for 14 days then Aggrenox BID - Rosuvastatin 20mg  daily - PT/OT/Speech  Hypertension: Permissive hypertension <220/120, gradually normalize over 5-7 days. Long-term BP goal for normotensive. - Currently holding BP meds, BP 148/58  Hyperlipidemia: Total choelsterol 183, LDL 120. Previous intolerance to Lipitor due to muscle weakness, recommend trying another. - Rosuvastatin 20mg  daily  Diabetes: History of diabetes. Goal Hgb A1c < 7.0. Not on any medications at home. Started sliding-scale insulin as inpatient, no insulin requirement.  CKD Stage 3: Stable  Depression: Stable - Continue home duloxetine 30mg   Asthma: Stable. - Continue home budesonide  Neuropathic pain: Stable - Continue home gabapentin 100mg  in AM, 200mg  in evening  Glaucoma: Stable. - Continue home latanoprost 0.005% one drop to both eyes at bedtime - Continue home Systane 1 drop in both eyes BID  Dispo: PT recommends SNF for rehab. Will be ready for discharge once testing and evaluations are complete, likely late today or tomorrow.   This is a Psychologist, occupational Note.  The care of the patient was discussed with Dr. Tasia Catchings and the assessment and plan formulated with their assistance.  Please see their attached note for official documentation of the daily encounter.   LOS: 0 days   Elmon Kirschner, Medical Student 07/28/2016, 11:11 AM

## 2016-07-28 NOTE — Evaluation (Signed)
Clinical/Bedside Swallow Evaluation Patient Details  Name: Carolyn Werner MRN: [id number] Date of Birth: 05/26/1922  Today's Date: 07/28/2016 Time: SLP Start Time (ACUTE ONLY): 1350 SLP Stop Time (ACUTE ONLY): 1402 SLP Time Calculation (min) (ACUTE ONLY): 12 min  Past Medical History:  Past Medical History:  Diagnosis Date  . Diabetes mellitus without complication ([name])   . Headache   . Hx of benign neoplasm of pituitary gland   . PVD (peripheral vascular disease) ([name])   . Shingles    Past Surgical History:  Past Surgical History:  Procedure Laterality Date  . ABOVE KNEE LEG AMPUTATION Right    HPI:  Pt is 83 year old female with PMH including hypertension, type 2 diabetes mellitus, CKD stage III, peripheral vascular disease s/p right leg AKA. Presented with complaints of slurred speech and bilateral hand heaviness. She does complain of difficulty swallowing both liquids and solids over the past 2-3 months. Denies any pain with swallowing, rather feels that food get stuck in her throat. MRI of brain showed multiple small foci of acute ischemia involving the pons and cerebellum. No mass effect or acute hemorrhage.   Assessment / Plan / Recommendation Clinical Impression  [name] was alert and oriented. She and her daughter stated that she has had difficulty with swallowing ("gets strangled") over the past few months. Observed pt with thin liquids via cup/straw and regular solids with no indications of airway compromise, coughing, or throat clearing at bedside. No oral phase impairments noted with regular solids or thin liquids. Advised pt to take small sips/bites, use a slow rate during mealtime (dtr reported pt eats fast), and warned against using straws. SLP educated pt and daughter re: pt's swallowing abilities and stated that she could return as an outpatient for an objective swallow study (MBS) if she continues to experience swallowing difficulties at home. Recommend regular  solids, thin liquids, meds whole with liquids. No treatment necessary at this time-ST signing off. SLP Visit Diagnosis: Dysphagia, unspecified (R13.10)    Aspiration Risk  Mild aspiration risk    Diet Recommendation Regular;Thin liquid   Liquid Administration via: Cup Medication Administration: Whole meds with liquid Supervision: Staff to assist with self feeding;Patient able to self feed Compensations: Slow rate;Small sips/bites Postural Changes: Seated upright at 90 degrees    Other  Recommendations Oral Care Recommendations: Oral care BID   Follow up Recommendations Other (comment) (TBD)      Frequency and Duration            Prognosis        Swallow Study   General HPI: Pt is 83 year old female with PMH including hypertension, type 2 diabetes mellitus, CKD stage III, peripheral vascular disease s/p right leg AKA. Presented with complaints of slurred speech and bilateral hand heaviness. She does complain of difficulty swallowing both liquids and solids over the past 2-3 months. Denies any pain with swallowing, rather feels that food get stuck in her throat. MRI of brain showed multiple small foci of acute ischemia involving the pons and cerebellum. No mass effect or acute hemorrhage. Type of Study: Bedside Swallow Evaluation Previous Swallow Assessment: none noted Diet Prior to this Study: Regular;Thin liquids Temperature Spikes Noted: No Respiratory Status: Room air History of Recent Intubation: No Behavior/Cognition: Alert;Cooperative;Pleasant mood Oral Cavity Assessment: Within Functional Limits Oral Care Completed by SLP: No Oral Cavity - Dentition: Adequate natural dentition Vision: Functional for self-feeding Self-Feeding Abilities: Able to feed self Patient Positioning: Upright in bed Baseline Vocal Quality: Low  vocal intensity Volitional Cough: Strong Volitional Swallow: Able to elicit    Oral/Motor/Sensory Function Overall Oral Motor/Sensory Function: Within  functional limits   Ice Chips Ice chips: Not tested   Thin Liquid Thin Liquid: Within functional limits Presentation: Cup;Straw    Nectar Thick Nectar Thick Liquid: Not tested   Honey Thick Honey Thick Liquid: Not tested   Puree Puree: Not tested   Solid   GO   Solid: Within functional limits Presentation: Self Fed    Functional Assessment Tool Used: skilled clinical judgement Functional Limitations: Swallowing Swallow Current Status (J1914): 0 percent impaired, limited or restricted Swallow Goal Status (N8295): 0 percent impaired, limited or restricted Swallow Discharge Status (A2130): 0 percent impaired, limited or restricted   [employer] , Student-SLP 07/28/2016,3:56 PM

## 2016-07-28 NOTE — Progress Notes (Signed)
*  PRELIMINARY RESULTS* Vascular Ultrasound Carotid Duplex (Doppler) has been completed.  Preliminary findings: Right 1-39% ICA stenosis. Left 80-99% ICA stenosis. Right vertebral not visualized. Left vertebral poorly visualized with dampened flow.    [facility].    Farrel DemarkJill Eunice, RDMS, RVT  07/28/2016, 11:23 AM

## 2016-07-28 NOTE — Discharge Instructions (Signed)
Ms. Carolyn Werner,  You were admitted to the hospital after having symptoms concerning for a stroke. The MRI showed signs of multiple small strokes and severe vascular disease. Your symptoms improved without any intervention, so out focus is on trying to manage your medications to reduce the risk of this happening again. You previously had a rash with Plavix so we are starting you on aspirin in the morning and Aggrenox in the evening for 14 days, followed by Aggrenox twice a day (drop the aspirin). You also previously had muscle weakness on Lipitor so we would like to try another statin called Crestor. Your diabetes is well controlled so you don't need any new medications at this time. For your blood pressure, we typically let it run high in the days immediately following a stroke, so we stopped your amlodipine and metoprolol, but please restart taking these medications on Monday. Follow up closely with your PCP, Carolyn Werner, so Dr. Katrinka BlazingSmith can continue to optimize your therapy. If you have a recurrence of symptoms, or new symptoms such as weakness, dizziness, or changes in your face or speech, please go to the emergency room to be evaluated.

## 2016-07-28 NOTE — Progress Notes (Signed)
  Echocardiogram 2D Echocardiogram has been performed.  Carolyn Werner, Carolyn Werner 07/28/2016, 12:06 PM

## 2016-07-28 NOTE — Progress Notes (Signed)
OT Cancellation Note  Patient Details Name: Carolyn Werner MRN: [id number] DOB: [birth year]   Cancelled Treatment:    Reason Eval/Treat Not Completed: Patient at procedure or test/ unavailable  Pt going to ECHO.  Will check on pt later in day or next day. Lise Auer, [place] [phone]  Einar Crow D 07/28/2016, 10:58 AM

## 2016-07-28 NOTE — Progress Notes (Signed)
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 Carotid ultrasound also shows high-grade asymptomatic left extracranial carotid stenosis. Patient has had history of allergic rash and Plavix in the past   OBJECTIVE Temp:  [97.5 F (36.4 C)-98.4 F (36.9 C)] 97.7 F (36.5 C) (02/23 1009) Pulse Rate:  [60-76] 76 (02/23 1009) Cardiac Rhythm: Normal sinus rhythm (02/23 0704) Resp:  [14-20] 16 (02/23 1009) BP: (130-171)/(55-80) 148/58 (02/23 1009) SpO2:  [96 %-100 %] 96 % (02/23 1009) Weight:  [61.3 kg (135 lb 3.2 oz)] 61.3 kg (135 lb 3.2 oz) (02/22 2000)  CBC:  Recent Labs Lab 07/27/16 1111  WBC 6.3  NEUTROABS 4.2  HGB 12.4  HCT 38.1  MCV 87.8  PLT 139*    Basic Metabolic Panel:  Recent Labs Lab 07/27/16 1111  NA 136  K 4.1  CL 105  CO2 24  GLUCOSE 253*  BUN 15  CREATININE 1.17*  CALCIUM 10.0   HgbA1c: No results found for: HGBA1C   PHYSICAL EXAM Pleasant elderly african american lady .hard of hearing despite hearing aids. . Afebrile. Head is nontraumatic. Neck is supple without bruit.    Cardiac exam no murmur or gallop. Lungs are clear to auscultation. Distal pulses are well felt. Neurological Exam ;  Awake  Alert oriented x 3. Normal speech and language.eye movements full without nystagmus.fundi were not visualized. Vision acuity and fields appear normal. Hearing is diminished bilaterally Palatal movements are normal. Face symmetric. Tongue midline. Normal strength, tone, reflexes and coordination. Normal sensation. Gait deferred.  ASSESSMENT/PLAN Carolyn Werner is a 83 y.o. female with history of HTN, DM, CK D stage III, PVD s/p R AKA presenting with bilateral upper extremity weakness. She did not receive IV t-PA due to symptoms resolved.    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 Aspects = 10   MRI  multiple small bilateral pontine and cerebellar infarcts  MRA  left PCA and left VA occluded  Carotid Doppler  L ICA 80-99% stenosis  2D Echo  pending   LDL 120  HgbA1c pending  Lovenox 30 mg sq daily for VTE prophylaxis  Diet Carb Modified Fluid consistency: Thin; Room service appropriate? Yes  aspirin 325 mg daily prior to admission, now on aspirin 325 mg daily. Given large vessel intracranial atherosclerosis, patient should be treated with aspirin 81 mg and clopidogrel 75 mg orally every day x 3 months for secondary stroke prevention. After 3 months, change to plavix alone. Long-term dual antiplatelets are contraindicated due to risk for intracerebral hemorrhage. Note patient has listed "allergy" to Plavix, but no details. If true allergy, avoid,    Therapy recommendations:  SNF  Disposition:  pending   Carotid Stenosis  L ICA 80-99%  Incidental finding  Hypertension  Stable Permissive hypertension (OK if < 220/120) but gradually normalize in 5-7 days Long-term BP goal normotensive  Hyperlipidemia  Home meds:  No statin  LDL elevated, above goal  Muscle weakness on lipitor in the past. Recommend trying another statin  Diabetes  HgbA1c goal < 7.0  Other Stroke Risk Factors  Advanced age  Former Cigarette smoker  UDS neg  PVD s/p R AKA  Other Active Problems  Chronic kidney disease stage III   Asthma   Neuropathic pain   Glaucoma  Hospital day # 0  Rhoderick MoodyBIBY,SHARON  [facility] See Amion for Pager information 07/28/2016 11:02 AM   I have personally examined this patient, reviewed notes, independently viewed imaging studies, participated in medical decision making and plan of care.ROS completed by me  personally and pertinent positives fully documented  I have made any additions or clarifications directly to the above note. Agree with note above.  She had presented with symptomatic intracranial vertebral artery occlusion and also has asymptomatic high-grade extracranial left carotid stenosis. Patient is not a candidate for dual antiplatelet therapy given history of rash on Plavix. Ticlopidine unfortunately is not available in the [place] hence recommend switching to Aggrenox instead. Vascular surgery consult for elective left carotid revascularization in a few weeks. Aggressive risk factor modification. Discussed with medicine service resident on call. Greater than 50% time during the study family at visit was spent on counseling and coordination of care about her stroke, discussion of evaluation and treatment plan and answering questions.  Delia HeadyPramod Sethi, MD Medical Director [facility] Pager: [phone] 07/28/2016 11:34 AM   To contact Stroke Continuity provider, please refer to [email or web address]. After hours, contact General Neurology

## 2016-07-29 DIAGNOSIS — I6522 Occlusion and stenosis of left carotid artery: Secondary | ICD-10-CM

## 2016-07-29 DIAGNOSIS — N183 Chronic kidney disease, stage 3 (moderate): Secondary | ICD-10-CM

## 2016-07-29 DIAGNOSIS — M792 Neuralgia and neuritis, unspecified: Secondary | ICD-10-CM

## 2016-07-29 DIAGNOSIS — E785 Hyperlipidemia, unspecified: Secondary | ICD-10-CM

## 2016-07-29 DIAGNOSIS — E1122 Type 2 diabetes mellitus with diabetic chronic kidney disease: Secondary | ICD-10-CM

## 2016-07-29 DIAGNOSIS — I10 Essential (primary) hypertension: Secondary | ICD-10-CM

## 2016-07-29 DIAGNOSIS — I639 Cerebral infarction, unspecified: Secondary | ICD-10-CM

## 2016-07-29 LAB — GLUCOSE, CAPILLARY
GLUCOSE-CAPILLARY: 225 mg/dL — AB (ref 65–99)
GLUCOSE-CAPILLARY: 143 mg/dL — AB (ref 65–99)
Glucose-Capillary: 103 mg/dL — ABNORMAL HIGH (ref 65–99)
Glucose-Capillary: 107 mg/dL — ABNORMAL HIGH (ref 65–99)
Glucose-Capillary: 110 mg/dL — ABNORMAL HIGH (ref 65–99)
Glucose-Capillary: 127 mg/dL — ABNORMAL HIGH (ref 65–99)

## 2016-07-29 LAB — HEMOGLOBIN A1C
Hgb A1c MFr Bld: 7.1 % — ABNORMAL HIGH (ref 4.8–5.6)
Mean Plasma Glucose: 157 mg/dL

## 2016-07-29 MED ORDER — AMLODIPINE BESYLATE 2.5 MG PO TABS: 2.5000 mg | ORAL_TABLET | Freq: Every evening | ORAL | Status: DC

## 2016-07-29 MED ORDER — INSULIN ASPART 100 UNIT/ML ~~LOC~~ SOLN: 0.0000 [IU] | Freq: Every day | SUBCUTANEOUS | Status: DC

## 2016-07-29 MED ORDER — INSULIN ASPART 100 UNIT/ML ~~LOC~~ SOLN
0.0000 [IU] | Freq: Three times a day (TID) | SUBCUTANEOUS | Status: DC
  Administered 2016-07-29: 1 [IU] via SUBCUTANEOUS
  Administered 2016-07-30 – 2016-07-31 (×2): 2 [IU] via SUBCUTANEOUS

## 2016-07-29 NOTE — Clinical Social Work Placement (Signed)
   CLINICAL SOCIAL WORK PLACEMENT  NOTE  Date:  07/29/2016  Patient Details  Name: Carolyn Werner MRN: [id number] Date of Birth: 1921-07-07  Clinical Social Work is seeking post-discharge placement for this patient at the Skilled  Nursing Facility level of care (*CSW will initial, date and re-position this form in  chart as items are completed):  Yes   Patient/family provided with [facility] Clinical Social Work Department's list of facilities offering this level of care within the geographic area requested by the patient (or if unable, by the patient's family).  Yes   Patient/family informed of their freedom to choose among providers that offer the needed level of care, that participate in Medicare, Medicaid or managed care program needed by the patient, have an available bed and are willing to accept the patient.  Yes   Patient/family informed of [facility]'s ownership interest in [facility] and [facility], as well as of the fact that they are under no obligation to receive care at these facilities.  PASRR submitted to EDS on 07/29/16     PASRR number received on 07/29/16     Existing PASRR number confirmed on       FL2 transmitted to all facilities in geographic area requested by pt/family on 07/29/16     FL2 transmitted to all facilities within larger geographic area on       Patient informed that his/her managed care company has contracts with or will negotiate with certain facilities, including the following:            Patient/family informed of bed offers received.  Patient chooses bed at       Physician recommends and patient chooses bed at      Patient to be transferred to   on  .  Patient to be transferred to facility by       Patient family notified on   of transfer.  Name of family member notified:        PHYSICIAN Please sign FL2     Additional Comment:    _______________________________________________ Raye Sorrow, LCSW 07/29/2016,  3:13 PM

## 2016-07-29 NOTE — Progress Notes (Signed)
STROKE TEAM PROGRESS NOTE   SUBJECTIVE  No family at bedside. Pt sitting in chair comfortably. Stated that she still has right hand mild weakness, but no numbness. She thinks her speech is back to norma.    OBJECTIVE Temp:  [97.7 F (36.5 C)-98 F (36.7 C)] 98 F (36.7 C) (02/24 0400) Pulse Rate:  [64-78] 72 (02/24 0400) Cardiac Rhythm: Normal sinus rhythm (02/23 1900) Resp:  [16] 16 (02/24 0400) BP: (143-166)/(58-66) 143/62 (02/24 0400) SpO2:  [96 %-100 %] 97 % (02/24 0400)  CBC:   Recent Labs Lab 07/27/16 1111  WBC 6.3  NEUTROABS 4.2  HGB 12.4  HCT 38.1  MCV 87.8  PLT 139*    Basic Metabolic Panel:   Recent Labs Lab 07/27/16 1111  NA 136  K 4.1  CL 105  CO2 24  GLUCOSE 253*  BUN 15  CREATININE 1.17*  CALCIUM 10.0   HgbA1c:  Lab Results  Component Value Date   HGBA1C 7.1 (H) 07/28/2016   Imaging studies I have personally reviewed the radiological images below and agree with the radiology interpretations.  Mri and Mra Brain Wo Contrast 07/27/2016 IMPRESSION: 1. Multiple small foci of acute ischemia involving the pons and cerebellum. No mass effect or acute hemorrhage. 2. Motion degraded time-of-flight MRA. Within that limitation, the left PCA and left vertebral artery show no flow related enhancement and may be occluded.   Ct Head Code Stroke W/o Cm 07/27/2016 IMPRESSION: 1. No acute intracranial abnormality 2. ASPECTS is 10 Electronically Signed: By: Marlan Palau M.D. On: 07/27/2016 12:33   TTE 07/28/2016 Study Conclusions - Left ventricle: The cavity size was normal. There was mild focal   basal hypertrophy of the septum. Systolic function was normal.   The estimated ejection fraction was in the range of 60% to 65%.   Wall motion was normal; there were no regional wall motion   abnormalities. There was an increased relative contribution of   atrial contraction to ventricular filling. Doppler parameters are   consistent with abnormal left  ventricular relaxation (grade 1   diastolic dysfunction). - Aortic valve: There was mild to moderate regurgitation.   Regurgitation pressure half-time: 403 ms. - Mitral valve: Calcified annulus. Moderate diffuse calcification   of the anterior leaflet. There was mild regurgitation. - Left atrium: The atrium was mildly dilated. - Atrial septum: There was increased thickness of the septum,   consistent with lipomatous hypertrophy.  CUS - Right 1-39% ICA stenosis. Left 80-99% ICA stenosis. Right vertebral not visualized. Left vertebral poorly visualized with dampened flow.    PHYSICAL EXAM  Temp:  [97.6 F (36.4 C)-98 F (36.7 C)] 97.6 F (36.4 C) (02/24 0956) Pulse Rate:  [64-79] 79 (02/24 0956) Resp:  [16] 16 (02/24 0956) BP: (118-166)/(60-66) 118/60 (02/24 0956) SpO2:  [97 %-100 %] 98 % (02/24 0956)  General - Well nourished, well developed, in no apparent distress.  Ophthalmologic - Fundi not visualized due to noncooperation.  Cardiovascular - Regular rate and rhythm.  Mental Status -  Level of arousal and orientation to time, place, and person were intact. Language including expression, naming, repetition, comprehension was assessed and found intact.  Cranial Nerves II - XII - II - Visual field intact OU. III, IV, VI - Extraocular movements intact. V - Facial sensation showed mildly decreased on the right. VII - Facial movement intact bilaterally. VIII - Hearing & vestibular intact bilaterally. X - Palate elevates symmetrically. XI - Chin turning & shoulder shrug intact bilaterally. XII -  Tongue protrusion intact.  Motor Strength - The patient's strength was normal in all extremities except right bicep, tricep and hand grip 4+/5 and pronator drift was absent. Pt has right AKA.  Bulk was normal and fasciculations were absent.   Motor Tone - Muscle tone was assessed at the neck and appendages and was normal.  Reflexes - The patient's reflexes were 1+ in all  extremities and she had no pathological reflexes.  Sensory - Light touch, temperature/pinprick were assessed and were symmetrical.    Coordination - The patient had normal movements in the hands with no ataxia or dysmetria.  Tremor was absent.  Gait and Station - not tested due to right AKA and no prosthesis available.   ASSESSMENT/PLAN Carolyn Werner is a 83 y.o. female with history of HTN, DM, CK D stage III, PVD s/p R AKA presenting with bilateral upper extremity weakness. She did not receive IV t-PA due to symptoms resolved.   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   MRI  multiple small bilateral pontine and cerebellar infarcts  MRA  left PCA and left VA occluded, and left cavernous ICA high grade stenosis  Carotid Doppler  L ICA 80-99% stenosis  2D Echo - EF 60-65%. No cardiac source of emboli identified.   VVS consulted and recommend conservative management  LDL 120  HgbA1c 7.1  Lovenox 30 mg sq daily for VTE prophylaxis Diet Carb Modified Fluid consistency: Thin; Room service appropriate? Yes  aspirin 325 mg daily prior to admission, now on aggrenox.  Pt is allergic to plavix. Continue aggrenox on discharge.  Therapy recommendations:  SNF  Disposition:  pending   Left carotid stenosis  CUS showed L ICA 80-99%  Incidental finding  VVS consulted and recommended conservative management.  BP goal 130-150 due to left ICA stenosis  Hypertension  Stable Permissive hypertension (OK if < 220/120) but gradually normalize in 5-7 days Long-term BP goal 130-150  Hyperlipidemia  Home meds:  No statin  LDL 120, goal < 70  Not able to tolerate lipitor.   Now on crestor 20mg   Continue statin on discharge if tolerating  Diabetes  HgbA1c 7.1, goal <  7.0  SSI  CBG monitoring  Other Stroke Risk Factors  Advanced age  Former Cigarette smoker  UDS neg  PVD s/p R AKA  Other Active Problems  Chronic kidney disease stage III   Asthma   Neuropathic pain   Glaucoma  Hospital day # 1  Neurology will sign off. Please call with questions. [facility]. Thanks for the consult.  Marvel PlanJindong Maila Dukes, MD PhD Stroke Neurology 07/29/2016 10:22 AM   To contact Stroke Continuity provider, please refer to [email or web address]. After hours, contact General Neurology

## 2016-07-29 NOTE — Progress Notes (Signed)
Medicine attending: Clinical status and pertinent data are reviewed with resident physician Dr. Valentino NoseNathan Boswell and I concur with his evaluation and management plan which will be detailed in his progress note to follow. We greatly appreciate prompt assistance from consultants. The patient and family do not want aggressive surgical intervention for her left internal carotid artery stenosis.  Vascular surgery does not recommend any further diagnostic testing. She is stable from the neurologic point of view.  Minimal residual right hand weakness. Normal echocardiogram for age. No further recommendations from neurology. Family interested in a temporary rehab facility and we will ask our care managers to assist. Continue aspirin and Aggrenox.

## 2016-07-29 NOTE — Evaluation (Addendum)
Occupational Therapy Evaluation Patient Details Name: Carolyn IvoryJuanita Munro MRN: [id number] DOB: 07/11/1921 Today's Date: 07/29/2016    History of Present Illness Pt is a 83 year old female with a past medical history including hypertension, type 2 diabetes mellitus, CKD stage III, peripheral vascular disease s/p right leg AKA presented with complaints of slurred speech and bilateral hand heaviness. MRI revealed Multiple small foci of acute ischemia involving the pons and cerebellum.   Clinical Impression   PTA pt was living with her daughter and required some assistance with dressing and bathing tasks but was able to complete squat-pivot transfers from wheelchair independently. Pt currently requires min assist for squat-pivot toilet transfers and max assist for toileting hygeine. Pt additionally demonstrates slightly decreased R hand strength during functional activities. She demonstrates significantly decreased ADL independence compared to PLOF and would benefit from SNF placement for short-term rehabilitation post-acute D/C. OT will continue to follow acutely.  Of note, following transfer back to recliner [facility] at 1435, pt reporting difficulty speaking and did demonstrate increased slurring of words and facial droop. OT immediately notified RN who came to room. Pt was assisted back to supine position and RN completed assessment and notified attending and neurology MD who were in route to examine pt. For vitals at this time see flow-sheet at 1439.    Follow Up Recommendations  SNF;Supervision/Assistance - 24 hour    Equipment Recommendations  Other (comment) (TBD)    Recommendations for Other Services       Precautions / Restrictions Precautions Precautions: Fall Restrictions Weight Bearing Restrictions: No      Mobility Bed Mobility Overal bed mobility: Needs Assistance Bed Mobility: Sit to Supine     Supine to sit: Min assist     General bed mobility comments: Min assist to  raise L LE into bed.  Transfers Overall transfer level: Needs assistance Equipment used: None Transfers: Squat Pivot Transfers Sit to Stand: Min assist   Squat pivot transfers: Min assist     General transfer comment: Pt able to complete sqaut pivot toilet transfer to R [facility]     Balance Overall balance assessment: Needs assistance Sitting-balance support: No upper extremity supported;Feet supported Sitting balance-Leahy Scale: Fair     Standing balance support: During functional activity;Bilateral upper extremity supported Standing balance-Leahy Scale: Poor                              ADL Overall ADL's : Needs assistance/impaired Eating/Feeding: Set up;Sitting;Supervision/ safety   Grooming: Set up;Sitting;Supervision/safety   Upper Body Bathing: Set up;Sitting;Supervision/ safety   Lower Body Bathing: Sit to/from stand;Moderate assistance   Upper Body Dressing : Set up;Sitting;Supervision/safety   Lower Body Dressing: Moderate assistance;Sit to/from stand   Toilet Transfer: Squat-pivot;Minimal assistance;BSC   Toileting- Clothing Manipulation and Hygiene: Maximal assistance;Sit to/from stand         General ADL Comments: Pt [facility] able [facility] squat pivot [facility]. Requiring mod assist +2 at end of session after increase in slurred speech. RN present and observed. Called MD and he was on his way to evaluate pt. Assisted pt back to supine position in bed.     Vision Patient Visual Report: No change from baseline Additional Comments: Limited evaluation due to increase in symptoms. Will continue to assess.     Perception     Praxis      Pertinent Vitals/Pain Pain  Assessment: No/denies pain     Hand Dominance Right   Extremity/Trunk Assessment Upper Extremity Assessment Upper Extremity Assessment: RUE deficits/detail RUE Deficits / Details: Slight weakness in R UE during  functional tasks.   Lower Extremity Assessment Lower Extremity Assessment: Generalized weakness       Communication Communication Communication: No difficulties;Expressive difficulties (Initially none but increased slurring - notified RN)   Cognition Arousal/Alertness: Awake/alert Behavior During Therapy: WFL for tasks assessed/performed Overall Cognitive Status: Impaired/Different from baseline Area of Impairment: Memory;Safety/judgement;Problem solving;Attention   Current Attention Level: Selective Memory: Decreased short-term memory   Safety/Judgement: Decreased awareness of safety   Problem Solving: Slow processing;Difficulty sequencing;Requires verbal cues     General Comments       Exercises       Shoulder Instructions      Home Living Family/patient expects to be discharged to:: Skilled nursing facility Living Arrangements: Children Available Help at Discharge: Personal care attendant;Available PRN/intermittently;Family (Attendant 3 hours/day, 5 days/week) Type of Home: Apartment Home Access: Ramped entrance     Home Layout: One level     Bathroom Shower/Tub: Tub/shower unit;Curtain   Bathroom Toilet: Standard     Home Equipment: Bedside commode;Tub bench;Grab bars - toilet;Grab bars - tub/shower;Wheelchair - power;Hospital bed;Other (comment) (trapeze bar)      Lives With: Daughter    Prior Functioning/Environment Level of Independence: Needs assistance  Gait / Transfers Assistance Needed: Able to complete independently from w/c ADL's / Homemaking Assistance Needed: Daughter and personal care attendant provide assistance for dressing/bathing. Daughter completes homemaking tasks.            OT Problem List: Decreased strength;Decreased activity tolerance;Impaired balance (sitting and/or standing);Impaired UE functional use;Decreased safety awareness;Decreased knowledge of precautions;Decreased knowledge of use of DME or AE      OT  Treatment/Interventions: Self-care/ADL training;Therapeutic exercise;Energy conservation;DME and/or AE instruction;Neuromuscular education;Therapeutic activities;Patient/family education;Balance training;Visual/perceptual remediation/compensation;Cognitive remediation/compensation    OT Goals(Current goals can be found in the care plan section) Acute Rehab OT Goals Patient Stated Goal: get stronger at rehab to go home OT Goal Formulation: With patient Time For Goal Achievement: 08/12/16 Potential to Achieve Goals: Good ADL Goals Pt Will Perform Lower Body Dressing: with modified independence;sitting/lateral leans Pt Will Transfer to Toilet: with modified independence;squat pivot transfer;bedside commode Pt Will Perform Toileting - Clothing Manipulation and hygiene: with modified independence;sitting/lateral leans Pt/caregiver will Perform Home Exercise Program: Right Upper extremity;Increased strength;With written HEP provided;Independently Additional ADL Goal #1: Pt will sequence multi-step ADL task during morning routine in order to improve independence with ADL.  OT Frequency: Min 2X/week   Barriers to D/C:            Co-evaluation              End of Session Equipment Utilized During Treatment: Gait belt Nurse Communication: Mobility status;Other (comment) (Pt with increased slurred speech, facial droop)  Activity Tolerance: Treatment limited secondary to medical complications (Comment) (Increase in slurred speech; immediately notified RN) Patient left: in bed;with nursing/sitter in room;with call bell/phone within reach;with bed alarm set  OT Visit Diagnosis: Muscle weakness (generalized) (M62.81);Hemiplegia and hemiparesis Hemiplegia - Right/Left: Right Hemiplegia - dominant/non-dominant: Dominant Hemiplegia - caused by: Cerebral infarction                ADL either performed or assessed with clinical judgement  Time: 1420-1442 OT Time Calculation (min): 22  min Charges:  OT General Charges $OT Visit: 1 Procedure OT Evaluation $OT Eval Moderate Complexity: 1 Procedure G-Codes:  Doristine Section, MS OTR/L  Pager: [phone]   Doristine Section 07/29/2016, 3:59 PM

## 2016-07-29 NOTE — Progress Notes (Signed)
Paged this afternoon for recurrent stroke symptoms. Reportedly patient was working with OT this afternoon and upon attempting to transfer her back to her recliner from the bedside commode she had acute onset slurred speech, weakness and facial droop. Patient reports she felt weak all over with her left left feeling the worst but also bilateral upper extremity weakness (worst in her hands). Had slurred speech and difficulty talking to the occupational therapist at the time. OT did note a facial droop but unclear which side. RN was notified and patient was positioned into reclining position. BP when checked after placed in reclining position was 144/57. Patient does report feeling dizzy when symptoms occurred.   Upon arrival, symptoms had resolved and patient was back to normal. Per report symptoms lasted 5-7 minutes. Intact neuro examination with CN II-XII intact, no facial droop or slurred speech present. No focal deficits; right hand grip actually improved over exam this morning. She is AAOx3.   Dr. Roda ShuttersXu evaluated patient after event and was present after I arrived. Believes symptoms are secondary to hypotension given her significant atherosclerotic disease and left ICA stenosis. Holding all antihypertensives. Check BP q4hr and monitor overnight. Orthostatic vitals in the morning. Do not feel this is progression of her stroke or new strokes; no further imaging required at this time.

## 2016-07-29 NOTE — Clinical Social Work Note (Signed)
Clinical Social Work Assessment  Patient Details  Name: Carolyn Werner MRN: [id number] Date of Birth: March 19, 1922  Date of referral:  07/29/16               Reason for consult:  Facility Placement                Permission sought to share information with:  Case Manager, Family Supports Permission granted to share information::  Yes, Verbal Permission Granted  Name::        Agency::     Relationship::  Daughter  Contact Information:     Housing/Transportation Living arrangements for the past 2 months:  Apartment Source of Information:  Patient, Medical Team, Tourist information centre manager, Adult Children Patient Interpreter Needed:  None Criminal Activity/Legal Involvement Pertinent to Current Situation/Hospitalization:  No - Comment as needed Significant Relationships:  Adult Children, Other Family Members Lives with:  Self Do you feel safe going back to the place where you live?  No Need for family participation in patient care:  Yes (Comment)  Care giving concerns:  LCSW met with patient daughter at the bedside. Daughter reports they moved here about 4 years ago from [place] where patient was living. When she first got here she was living in an ALF: [facility] and transitioned home.  Daughter is main caregiver and checks on her regularly.  Daughter reports patient needs more care than she can manage and uses a wheelchair for most mobility, but currently too weak to sit in wheelchair and not at baseline.  Hopeful for SNF placement for acute rehab.   Social Worker assessment / plan:  LCSW completed assessment, explained role of SW and gave daughter list of SNF beds in [place].  WIll follow up with bed offers.  Plan: SNF work up completed. Will follow up with bed list.  Employment status:  Retired Forensic scientist:  Medicare, Managed Care PT Recommendations:  [place] / Referral to community resources:  [place]  Patient/Family's  Response to care:  Agreeable to plan  Patient/Family's Understanding of and Emotional Response to Diagnosis, Current Treatment, and Prognosis:  Daughter proactive and understanding of process and placement. Understands limitations with returning home at this time and agreeable to SNF.  Emotional Assessment Appearance:  Appears stated age Attitude/Demeanor/Rapport:    Affect (typically observed):  Accepting, Adaptable Orientation:  Oriented to Self, Oriented to Place, Oriented to  Time, Oriented to Situation Alcohol / Substance use:  Not Applicable Psych involvement (Current and /or in the community):  No (Comment)  Discharge Needs  Concerns to be addressed:  No discharge needs identified Readmission within the last 30 days:  No Current discharge risk:  None Barriers to Discharge:  No Barriers Identified   [place], LCSW 07/29/2016, 3:09 PM

## 2016-07-29 NOTE — NC FL2 (Signed)
[place] MEDICAID FL2 LEVEL OF CARE SCREENING TOOL     IDENTIFICATION  Patient Name: Carolyn Werner Birthdate: [birth year] Sex: female Admission Date (Current Location): 07/27/2016  [facility] and [place] Number:  Producer, television/film/video and Address:  [facility], [address] [place], [place] [address]      Provider Number: [id number]  Attending Physician Name and Address:  Levert Feinstein, MD  Relative Name and Phone Number:       Current Level of Care: Hospital Recommended Level of Care: Skilled Nursing Facility Prior Approval Number:    Date Approved/Denied:   PASRR Number:   [id number]  Discharge Plan: SNF    Current Diagnoses: Patient Active Problem List   Diagnosis Date Noted  . Acute CVA (cerebrovascular accident) ([name]) 07/28/2016  . Cerebral infarction ([name])   . Slurred speech 07/27/2016  . CKD (chronic kidney disease) stage 3, GFR 30-59 ml/min 07/27/2016  . Diabetes mellitus ([name]) 07/27/2016  . Hypertension 07/27/2016  . Depression 07/27/2016  . Glaucoma 07/27/2016  . Neuropathic pain 07/27/2016  . Asthma 07/27/2016  . Heart palpitations 07/05/2016  . Cardiac murmur 07/05/2016  . Phantom limb pain ([name]) 11/13/2014  . Cervical disc disorder with radiculopathy of cervical region 11/13/2014    Orientation RESPIRATION BLADDER Height & Weight     Self, Time, Situation, Place  Normal Continent Weight: 135 lb 3.2 oz (61.3 kg) Height:  5\' 4"  (162.6 cm)  BEHAVIORAL SYMPTOMS/MOOD NEUROLOGICAL BOWEL NUTRITION STATUS      Continent Diet (Regular;Thin liquid )  AMBULATORY STATUS COMMUNICATION OF NEEDS Skin   Extensive Assist Verbally Normal                       Personal Care Assistance Level of Assistance  Bathing, Feeding, Dressing Bathing Assistance: Limited assistance Feeding assistance: Limited assistance Dressing Assistance: Limited assistance     Functional Limitations Info  Sight, Hearing, Speech Sight Info:  Adequate Hearing Info: Adequate Speech Info: Adequate    SPECIAL CARE FACTORS FREQUENCY  PT (By licensed PT), OT (By licensed OT), Speech therapy     PT Frequency: 5x OT Frequency: 5x     Speech Therapy Frequency: 5x      Contractures Contractures Info: Not present    Additional Factors Info  Code Status, Allergies Code Status Info: Full [place] Allergies Info: [place] Hct Candesartan Cilexetil-hctz, Carbamazepine, Erythromycin, Glucophage Metformin Hcl, Lipitor Atorvastatin, Nsaids, Sulfa Antibiotics, Ceclor Cefaclor, Clindamycin/lincomycin, Dilaudid Hydromorphone Hcl, Penicillins, Plavix Clopidogrel           Current Medications (07/29/2016):  This is the current hospital active medication list Current Facility-Administered Medications  Medication Dose Route Frequency Provider Last Rate Last Dose  . acetaminophen (TYLENOL) tablet 650 mg  650 mg Oral Q4H PRN Valentino Nose, MD   650 mg at 07/28/16 7846   Or  . acetaminophen (TYLENOL) solution 650 mg  650 mg Per Tube Q4H PRN Valentino Nose, MD       Or  . acetaminophen (TYLENOL) suppository 650 mg  650 mg Rectal Q4H PRN Valentino Nose, MD      . albuterol (PROVENTIL) (2.5 MG/3ML) 0.083% nebulizer solution 2.5 mg  2.5 mg Nebulization Q4H PRN Valentino Nose, MD      . aspirin EC tablet 81 mg  81 mg Oral Daily Tasrif Ahmed, MD   81 mg at 07/29/16 0935   And  . dipyridamole-aspirin (AGGRENOX) 200-25 MG per 12 hr capsule 1 capsule  1 capsule Oral  QHS Hyacinth Meekerasrif Ahmed, MD   1 capsule at 07/28/16 2239  . budesonide (PULMICORT) nebulizer solution 0.25 mg  0.25 mg Nebulization BID Valentino NoseNathan Boswell, MD   0.25 mg at 07/28/16 2050  . docusate sodium (COLACE) capsule 200 mg  200 mg Oral Daily PRN Valentino NoseNathan Boswell, MD      . DULoxetine (CYMBALTA) DR capsule 30 mg  30 mg Oral Daily Valentino NoseNathan Boswell, MD   30 mg at 07/29/16 0935  . enoxaparin (LOVENOX) injection 30 mg  30 mg Subcutaneous Q24H Valentino NoseNathan Boswell, MD   30 mg at 07/28/16 1731  . gabapentin  (NEURONTIN) capsule 100 mg  100 mg Oral q morning - 10a Valentino NoseNathan Boswell, MD   100 mg at 07/29/16 0935   And  . gabapentin (NEURONTIN) capsule 200 mg  200 mg Oral QHS Valentino NoseNathan Boswell, MD   200 mg at 07/28/16 2239  . insulin aspart (novoLOG) injection 0-9 Units  0-9 Units Subcutaneous TID WC Valentino NoseNathan Boswell, MD   3 Units at 07/29/16 1205  . latanoprost (XALATAN) 0.005 % ophthalmic solution 1 drop  1 drop Both Eyes QHS Valentino NoseNathan Boswell, MD   1 drop at 07/28/16 2238  . polyvinyl alcohol (LIQUIFILM TEARS) 1.4 % ophthalmic solution 1 drop  1 drop Both Eyes BID PRN Valentino NoseNathan Boswell, MD      . rosuvastatin (CRESTOR) tablet 20 mg  20 mg Oral q1800 Tasrif Ahmed, MD   20 mg at 07/28/16 1729  . sodium chloride (OCEAN) 0.65 % nasal spray 1 spray  1 spray Each Nare Daily PRN Valentino NoseNathan Boswell, MD         Discharge Medications: Please see discharge summary for a list of discharge medications.  Relevant Imaging Results:  Relevant Lab Results:   Additional Information SSN: [id number]   Raye SorrowCoble, Jerrick Farve N, [place]

## 2016-07-29 NOTE — Progress Notes (Signed)
RN called to room by OT stating that pt was having a change in her speech. RN exam revealed increase in slurred speech and more pronounced facial droop. Pt placed flat in bed. MD notified. Attending and neurology coming to see pt

## 2016-07-29 NOTE — Progress Notes (Addendum)
   Subjective:  Ms. Carolyn Werner is doing well this morning. She does report feeling weak in her right hand. Reports word finding difficulty but unchanged from her baseline. No further slurred speech. Spoke with daughter over the phone and updated her on the plan for today. She does wish for SNF placement & patient is agreeable. Wishes to speak with social work regarding options today.   Objective:  Vital signs in last 24 hours: Vitals:   07/28/16 2012 07/28/16 2050 07/29/16 0018 07/29/16 0400  BP: (!) 166/60  (!) 146/62 (!) 143/62  Pulse: 64 68 78 72  Resp: 16 16 16 16   Temp: 97.9 F (36.6 C)  97.8 F (36.6 C) 98 F (36.7 C)  TempSrc: Oral  Oral Oral  SpO2: 100%  100% 97%  Weight:      Height:       Physical Exam  Constitutional: She is oriented to person, place, and time and well-developed, well-nourished, and in no distress. No distress.  Cardiovascular: Normal rate, regular rhythm and normal heart sounds.   Pulmonary/Chest: Effort normal and breath sounds normal.  Neurological: She is alert and oriented to person, place, and time. She has normal sensation, normal reflexes and intact cranial nerves. She displays normal reflexes. No cranial nerve deficit.  Right hand grip strength decreased, otherwise 5/5 strength throughout  Nursing note and vitals reviewed.  Assessment/Plan:  CVA - Acute Ischemic Infarct MRI/MRA showed multiple areas of acute infarcts in the cerebellum and pons with high burden of vascular disease with no flow in the left PCA and left vertebral arteries. Carotid dopplers showed left 80-99% ICA stenosis. She was evaluated by vascular surgery and family and patient were not interested in any surgical options given her age and poor performance with previous surgeries. 2D ECHO showed EF 60-65% with no wall motion abnormalities and grade 1 diastolic dysfunction. She has had a bad reaction to Plavix in the past, neurology recommended starting Aggrenox instead. Lipid panel  with LDL of 120. She has had significant myalgias in the past on Lipitor; started on Crestor 20 mg daily last night. A1c 7.1; goal for her age would be <8.0. PT/OT recommending SNF and daughter agreeable. Working on placement.  -ASA 81 mg dialy -Aggrenox qhs x 2 weeks; then bid -Crestor 20 mg qhs - Working on SNF placement  Hypertension: Were allowing permissive HTN <220/120 in setting of acute CVA. Now >48 hours out and will gradually normalize. SBP has been in 140-160s. She is on amlodipine 2.5 mg and metoprolol 25 mg bid at home. HR has been ~70 off the metoprolol. SBP goal is 130-150 given her left ICA stenosis.  -Resume amlodipine 2.5 mg daily today  Hyperlipidemia: Total choelsterol 183, LDL 120. Started on Crestor 20 mg daily yesterday.  - Continue Rosuvastatin 20mg  daily  Diabetes: History of diabetes. Goal Hgb A1c < 8.0 given her advanced age. Not on any medications at home. Started sliding-scale insulin as inpatient, no insulin requirement. CBGs have been 100-140 here. Will continue SSI as needed.   CKD Stage 3: Stable  Depression: Stable - Continue home duloxetine 30mg   Asthma: Stable. - Continue home budesonide  Neuropathic pain: Stable - Continue home gabapentin 100mg  in AM, 200mg  in evening  Glaucoma: Stable. - Continue home latanoprost 0.005% one drop to both eyes at bedtime - Continue home Systane 1 drop in both eyes BID  Dispo: Stable for discharge pending SNF placement  Carolyn NoseNathan Sigurd Pugh, MD 07/29/2016, 7:26 AM Pager: [phone]

## 2016-07-30 LAB — GLUCOSE, CAPILLARY
GLUCOSE-CAPILLARY: 100 mg/dL — AB (ref 65–99)
GLUCOSE-CAPILLARY: 110 mg/dL — AB (ref 65–99)
Glucose-Capillary: 184 mg/dL — ABNORMAL HIGH (ref 65–99)
Glucose-Capillary: 110 mg/dL — ABNORMAL HIGH (ref 65–99)

## 2016-07-30 MED ORDER — SODIUM CHLORIDE 0.9 % IV SOLN
INTRAVENOUS | Status: AC
  Administered 2016-07-30: 13:00:00 via INTRAVENOUS

## 2016-07-30 NOTE — Progress Notes (Signed)
   Subjective:  Carolyn Werner is doing well this morning. No complaints. Denies any further episodes of slurred speech. Still reporting weakness in her right arm, worst in right hand. Would like to go home but is agreeable to going to SNF for short time.   Objective:  Vital signs in last 24 hours: Vitals:   07/29/16 1932 07/29/16 2056 07/30/16 0138 07/30/16 0554  BP:  (!) 169/74 (!) 165/63 (!) 143/67  Pulse:  70 76 66  Resp:  16 16 16   Temp:  98 F (36.7 C) 97.9 F (36.6 C) 98 F (36.7 C)  TempSrc:  Oral Oral Oral  SpO2: 99% 99% 98% 96%  Weight:      Height:       General: alert, well-developed, and cooperative to examination.  Lungs: normal respiratory effort, no accessory muscle use, normal breath sounds Heart: normal rate, regular rhythm, no murmur, no gallop, and no rub.  Neurologic: alert & oriented X3, cranial nerves II-XII intact, mildly decreased grip strength in right hand otherwise strength normal in all extremities, sensation intact to light touch, and gait normal.  MSK: right leg AKA Psych: Oriented X3, memory intact for recent and remote, normally interactive, good eye contact, not anxious appearing, and not depressed appearin  Assessment/Plan:  CVA - Acute Ischemic Infarct Had an episode of recurrent stroke symptoms yesterday; slurred speech and weakness. Happened when she was going form the bedside commode to the chair. Felt dizzy at that time. Evaluated by neurology at the time and felt it was more hypotensive in nature than new stroke symptoms. Orthostatics positive this morning. Will hydrate with gentle IVF 100 mL/hr NS x 10 hours. Repeat orthostatics in the morning. [facility]  -ASA 81 mg dialy -Aggrenox qhs x 2 weeks; then bid -Crestor 20 mg qhs -NS 100 mL/hr x 10 hours -Orthostatics in am - Working on SNF placement  Hypertension: Goal SBP 130-150 given her left ICA stenosis. Holding antihypertensives. BP has been stable. Most recent  vitals 143/67. Orthostatics positive as above.   Hyperlipidemia: Total choelsterol 183, LDL 120. Started on Crestor 20 mg daily.  - Continue Rosuvastatin 20mg  daily  Diabetes: History of diabetes. Goal Hgb A1c < 8.0 given her advanced age. Not on any medications at home. Started sliding-scale insulin as inpatient, no insulin requirement. CBGs have been 100-184 here. Will continue SSI as needed.   CKD Stage 3: Stable  Depression: Stable - Continue home duloxetine 30mg   Asthma: Stable. - Continue home budesonide  Neuropathic pain: Stable - Continue home gabapentin 100mg  in AM, 200mg  in evening  Glaucoma: Stable. - Continue home latanoprost 0.005% one drop to both eyes at bedtime - Continue home Systane 1 drop in both eyes BID  Dispo: Anticipated discharge in approximately 1 day(s) pending SNF placement  Valentino NoseNathan Airiana Elman, MD 07/30/2016, 3:13 PM Pager: [phone]

## 2016-07-30 NOTE — Progress Notes (Signed)
Medicine attending: I examined this patient today together with resident physician Dr. Valentino NoseNathan Boswell and I concur with his evaluation and management plan which we discussed together. She had a transient episode of weakness and dysarthria yesterday afternoon.  She was evaluated by both our medical team and neurology.  Episode likely precipitated by transient fall in blood pressure when she was ambulated.  She has returned to her baseline status.  No evidence for extension of her stroke.  She has very minimal weakness of her right hand grip and clumsiness on finger to finger exam otherwise no focal deficits.  Blood pressure in acceptable range holding antihypertensive medications.  Discharge planning in progress and anticipate discharge to a rehab facility tomorrow.

## 2016-07-31 ENCOUNTER — Encounter: Payer: Self-pay | Admitting: Geriatric Medicine

## 2016-07-31 DIAGNOSIS — R29898 Other symptoms and signs involving the musculoskeletal system: Secondary | ICD-10-CM

## 2016-07-31 LAB — VAS US CAROTID
LCCADDIAS: 14 cm/s
LCCADSYS: 51 cm/s
LCCAPDIAS: 19 cm/s
LCCAPSYS: 80 cm/s
LEFT ECA DIAS: -32 cm/s
LEFT VERTEBRAL DIAS: -5 cm/s
Left ICA dist dias: -18 cm/s
Left ICA dist sys: -46 cm/s
Left ICA prox dias: 139 cm/s
Left ICA prox sys: 367 cm/s
RCCADSYS: -52 cm/s
RCCAPDIAS: 13 cm/s
RCCAPSYS: 76 cm/s
RIGHT ECA DIAS: -6 cm/s

## 2016-07-31 LAB — GLUCOSE, CAPILLARY
Glucose-Capillary: 108 mg/dL — ABNORMAL HIGH (ref 65–99)
Glucose-Capillary: 188 mg/dL — ABNORMAL HIGH (ref 65–99)

## 2016-07-31 MED ORDER — GABAPENTIN 100 MG PO CAPS: 100.0000 mg | ORAL_CAPSULE | Freq: Every morning | ORAL | 2 refills | Status: DC

## 2016-07-31 MED ORDER — ASPIRIN 81 MG PO TBEC: 81.0000 mg | DELAYED_RELEASE_TABLET | Freq: Every day | ORAL | 2 refills | Status: DC

## 2016-07-31 MED ORDER — ASPIRIN-DIPYRIDAMOLE ER 25-200 MG PO CP12: ORAL_CAPSULE | ORAL | 2 refills | Status: DC

## 2016-07-31 MED ORDER — ROSUVASTATIN CALCIUM 20 MG PO TABS: 20.0000 mg | ORAL_TABLET | Freq: Every day | ORAL | 2 refills | Status: DC

## 2016-07-31 MED ORDER — GABAPENTIN 100 MG PO CAPS: 200.0000 mg | ORAL_CAPSULE | Freq: Every day | ORAL | 2 refills | Status: DC

## 2016-07-31 NOTE — Progress Notes (Signed)
Discharge orders received.  Discharge instructions and follow-up appointments reviewed with the patient and her daughter.  VSS upon discharge.  [facility]  IV removed and education complete.  Transported via [facility].  Sondra ComeSilva, Khalie Wince M, RN

## 2016-07-31 NOTE — Discharge Summary (Signed)
Name: Werner Werner MRN: [id number] DOB: 10-24-1921 83 y.o. 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 [facility])  Discharge Medications: Allergies as of 07/31/2016      Reactions   Atacand Hct [candesartan Cilexetil-hctz] Other (See Comments)   Kidney disease   Carbamazepine Other (See Comments)   Changes in ability to read, think clearly, unsteady gait   Erythromycin Diarrhea   Glucophage [metformin Hcl] Other (See Comments)   Chronic low blood sugar   Lipitor [atorvastatin] Other (See Comments)   Muscle weakness   Nsaids Nausea And Vomiting   Motrin, Naprosyn, Voltaren, Celebrex, Prevacid, Mobic -- abdominal pain and burning, cramps, loose stool   Sulfa Antibiotics Anaphylaxis   Ceclor [cefaclor] Hives   Clindamycin/lincomycin Other (See Comments)   Abdominal pain, loss of appetite   Dilaudid [hydromorphone Hcl] Rash   Penicillins Rash   Plavix [clopidogrel] Other (See Comments)      Medication List    STOP taking these medications   amLODipine 2.5 MG tablet Commonly known as:  NORVASC   aspirin 325 MG tablet Replaced by:  aspirin 81 MG EC tablet   metoprolol tartrate 25 MG tablet Commonly known as:  LOPRESSOR   traMADol 50 MG tablet Commonly known as:  ULTRAM     TAKE these medications   aspirin 81 MG EC tablet Take 1 tablet (81 mg total) by mouth daily. Start taking on:  08/01/2016 Replaces:  aspirin 325 MG tablet   Biotin 5 MG Tabs Take 5 mg by mouth every evening.   budesonide 0.25 MG/2ML nebulizer solution Commonly known  as:  PULMICORT Take 0.25 mg by nebulization 2 (two) times daily.   dipyridamole-aspirin 200-25 MG 12hr capsule Commonly known as:  AGGRENOX Take once a day at bedtime; after 08/11/16 please take twice a day   docusate sodium 100 MG capsule Commonly known as:  COLACE Take 200 mg by mouth at bedtime.   DULoxetine 30 MG capsule Commonly known as:  CYMBALTA Take 30 mg by mouth daily.   Flaxseed Oil 1000 MG Caps Take 1,000 mg by mouth 3 (three) times daily.   fluticasone 50 MCG/ACT nasal spray Commonly known as:  FLONASE Place 1 spray into both nostrils daily as needed for allergies or rhinitis.   gabapentin 100 MG capsule Commonly known as:  NEURONTIN Take 2 capsules (200 mg total) by mouth at bedtime. What changed:  You were already taking a medication with the same name, and this prescription was added. Make sure you understand how and when to take each.   gabapentin 100 MG capsule Commonly known as:  NEURONTIN Take 1 capsule (100 mg total) by mouth every morning. Start taking on:  08/01/2016 What changed:  how much to take  how to take this  when to take this  additional instructions   latanoprost 0.005 % ophthalmic solution Commonly known as:  XALATAN Place 1 drop into both eyes at bedtime.   levalbuterol 45 MCG/ACT inhaler Commonly known as:  XOPENEX HFA Inhale 2 puffs into the lungs every 4 (four) hours as needed for  wheezing.   loratadine 10 MG tablet Commonly known as:  CLARITIN Take 10 mg by mouth daily as needed for allergies.   multivitamin with minerals Tabs tablet Take 1 tablet by mouth daily.   nystatin ointment Commonly known as:  MYCOSTATIN Apply 1 application topically daily as needed (for irritation).   rosuvastatin 20 MG tablet Commonly known as:  CRESTOR Take 1 tablet (20 mg total) by mouth daily at 6 PM.   sodium chloride 0.65 % Soln nasal spray Commonly known as:  OCEAN Place 1 spray into both nostrils daily as needed for congestion.     SYSTANE 0.4-0.3 % Soln Generic drug:  Polyethyl Glycol-Propyl Glycol Apply 1 drop to eye 2 (two) times daily as needed (for dry eyes).   Vitamin D 2000 units Caps Take 2,000 Units by mouth daily.       Disposition and follow-up:   Werner Werner [facility].  At the hospital follow up visit please address:  Acute CVA: On ASA and Aggrenox (Plavix allergy). Started Crestor. Had symptoms of slurred speech and right hand weakness. Right hand grip minimally weak on discharge otherwise intact neurologic examination. L ICA high grade stenosis but declined any interventions. Please assess for any recurrent stoke symptoms. Increase Aggrenox to BID after 2 weeks (08/11/16).   HTN: Goal SBP 130-150. Stopped all antihypertensives. Does have transient hypotension with standing but patient is s/p right AKA and only transfers from bed to wheelchair.   DM: A1c 7.1. No medications. Goal < 8.   HLD: Started Crestor 20 mg qhs. Please assess for tolerance.   2.  Labs / imaging needed at time of follow-up: None  3.  Pending labs/ test needing follow-up: None  Follow-up Appointments: Follow-up Information    Carolyn RiggsMARTIN,NANCY CAROLYN, NP. Schedule an appointment as soon as possible for a visit in 6 week(s).   Specialty:  Family Medicine Contact information: [address] [place] [address] [phone]           Hospital Course by problem list:  Acute, small bilateral pontine and cerebellar infarcts:  Werner Werner initially presented following an episode of slurred speech and bilateral hand weakness that lasted approximately 5 minutes. She had recurrent witnessed symptoms in the ED that resolved spontaneously. CT head showed mild diffused generalized atrophy and chronic small vessel ischemic disease and calcifications but no acute abnormalities. MRI.MRA brain did show significant pathology with diffuse atherosclerotic  disease, bilateral areas of infarctions within the pons and cerebellar hemispheres. It also showed a remote left occipital infract and absent flow in the left posterior cerebral artery and left vertebral artery. She was wearing a cardiac monitor and no arrhythmias noted. Carotid dopplers showed 80-99% stenosis of the left extracranial internal carotid artery; right 1-39% ICA stenosis. She was evaluated by vascular surgery and patient and family declined any interventions. She has a history of allergic reaction to Plavix in the past (described TEN picture) and was started on Aggrenox instead, continuing her home ASA 81 mg daily. Lipid panel was obtained that showed LDL of 120. She has a history of statin intolerance, started on Crestor 20 mg daily with no adverse side effects noted. A1c was 7.1. 2D ECHO with EF 60-65% and grade 1 diastolic dysfunction. She had an episode of recurrent symptoms during hospitalization. Felt to be secondary to hypotension as her symptoms occurred from sitting to standing and resolved once lying flat. Orthostatics were positive going from sitting to  standing but returned to normal after several minutes. Recommended slow transition from sitting to standing. At time of discharge she had only minimal right hand grip weakness and otherwise normal neurologic examination. Discharged to SNF for further rehabilitation.   HTN: Goal SBP 130-150 given her left ICA stenosis. Held her antihypertensives with BP somewhat labile but staying mostly in the 130-160 range. Discontinued all home antihypertensives at discharge.   Hyperlipidemia: Total choelsterol 183, LDL 120. Started on Crestor 20 mg daily as patient has history of statin induced myopathy and has been intolerant to all previous statins. Had no adverse reactions during hospitalization.  Diabetes Mellitus Type 2: History of diabetes. A1c was 7.1 on check here. Goal Hgb A1c <8.0 given her advanced age. CBGs were well controlled during  hospitalization with only one reading >200. No diabetic medications started on discharge.   Discharge Vitals:   BP (!) 163/72 (BP Location: Left Arm)   Pulse 76   Temp 98.3 F (36.8 C) (Oral)   Resp 18   Ht 5\' 4"  (1.626 m)   Wt 135 lb 3.2 oz (61.3 kg)   SpO2 98%   BMI 23.21 kg/m   Pertinent Labs, Studies, and Procedures:   CT HEAD WITHOUT CONTRAST FINDINGS: Brain: Mild atrophy for age. Negative for hydrocephalus. Negative for acute infarct. Negative for hemorrhage or mass. No shift of the midline structures. Vascular: Moderate atherosclerotic calcification. No hyperdense vessel. Skull: Negative Sinuses/Orbits: Mucoperiosteal thickening of the maxillary and sphenoid sinus bilaterally. Mastoid sinus clear. No orbital lesion. Bilateral lens replacement.  ASPECTS [facility] Stroke Program Early CT Score) - Ganglionic level infarction (caudate, lentiform nuclei, internal capsule, insula, M1-M3 cortex): 7 - Supraganglionic infarction (M4-M6 cortex): 3 Total score (0-10 with 10 being normal): 10  IMPRESSION: 1. No acute intracranial abnormality 2. ASPECTS is 10   MRA HEAD WITHOUT CONTRAST  FINDINGS: MRI HEAD FINDINGS  Brain: There are small foci of diffusion restriction within the bilateral pons and in both cerebellar hemispheres. There is an old left occipital infarct. Mild findings of chronic microvascular ischemia are present. No mass lesion or midline shift. No hydrocephalus or extra-axial fluid collection. No acute hemorrhage. The midline structures are normal. No age advanced or lobar predominant atrophy. Vascular: Major intracranial arterial and venous sinus flow voids are preserved. No evidence of chronic microhemorrhage or amyloid angiopathy. Skull and upper cervical spine: The visualized skull base, calvarium, upper cervical spine and extracranial soft tissues are normal. Sinuses/Orbits: There is moderate maxillary and ethmoid mucosal thickening. Normal  orbits.  MRA HEAD FINDINGS The time-of-flight sequence is markedly degraded by motion. There is no flow related enhancement seen within the left posterior cerebral artery. There is no flow related enhancement within the left vertebral artery. The other major intracranial arteries appear Patent.  IMPRESSION: 1. Multiple small foci of acute ischemia involving the pons and cerebellum. No mass effect or acute hemorrhage. 2. Motion degraded time-of-flight MRA. Within that limitation, the left PCA and left vertebral artery show no flow related enhancement and may be occluded.  Transthoracic Echocardiography  Study Conclusions  - Left ventricle: The cavity size was normal. There was mild focal   basal hypertrophy of the septum. Systolic function was normal.   The estimated ejection fraction was in the range of 60% to 65%.   Wall motion was normal; there were no regional wall motion   abnormalities. There was an increased relative contribution of   atrial contraction to ventricular filling. Doppler parameters are   consistent with abnormal left  ventricular relaxation (grade 1   diastolic dysfunction). - Aortic valve: There was mild to moderate regurgitation.   Regurgitation pressure half-time: 403 ms. - Mitral valve: Calcified annulus. Moderate diffuse calcification   of the anterior leaflet. There was mild regurgitation. - Left atrium: The atrium was mildly dilated. - Atrial septum: There was increased thickness of the septum,   consistent with lipomatous hypertrophy.  Vascular Ultrasound Preliminary findings: Right 1-39% ICA stenosis. Left 80-99% ICA stenosis. Right vertebral not visualized. Left vertebral poorly visualized with dampened flow.  Discharge Instructions: Discharge Instructions    Ambulatory referral to Neurology    Complete by:  As directed    Follow up with NP Darrol Angel at [facility] in about 2 months. Thanks.   Diet - low sodium heart healthy    Complete by:   As directed    Increase activity slowly    Complete by:  As directed       Signed: Valentino Nose, Werner 07/31/2016, 12:17 PM   Pager: [phone]

## 2016-07-31 NOTE — Care Management Note (Signed)
Case Management Note  Patient Details  Name: Carolyn Werner MRN: [id number] Date of Birth: 06/04/1922  Subjective/Objective:                    Action/Plan: [facility]. No further needs per CM.   Expected Discharge Date:  07/31/16               Expected Discharge Plan:  Skilled Nursing Facility  In-House Referral:  Clinical Social Work  Discharge planning Services     Post Acute Care Choice:    Choice offered to:     DME Arranged:    DME Agency:     HH Arranged:    HH Agency:     Status of Service:  Completed, signed off  If discussed at [employer] Meetings, dates discussed:    Additional Comments:  Kermit BaloKelli F Naomi Castrogiovanni, RN 07/31/2016, 1:25 PM

## 2016-07-31 NOTE — Progress Notes (Signed)
   Subjective:  Carolyn Werner is doing well this morning. She has no complaints. No further episodes of slurred speech. Still reporting some right hand weakness but otherwise no neurologic symptoms. Eager to get out of the hospital.   Objective:  Vital signs in last 24 hours: Vitals:   07/30/16 2001 07/30/16 2150 07/31/16 0150 07/31/16 0552  BP:  (!) 192/73 136/64 (!) 156/67  Pulse:  72 79 80  Resp:  16 16 16   Temp:  97.9 F (36.6 C) 97.9 F (36.6 C) 98.1 F (36.7 C)  TempSrc:  Oral Oral Oral  SpO2: 100% 99% 100% 97%  Weight:      Height:       Physical Exam  Constitutional: She is oriented to person, place, and time and well-developed, well-nourished, and in no distress.  Cardiovascular: Normal rate and regular rhythm.   Pulmonary/Chest: Effort normal and breath sounds normal.  Abdominal: Soft. Bowel sounds are normal.  Neurological: She is alert and oriented to person, place, and time. She has normal sensation, normal reflexes and intact cranial nerves.  Minimally reduced right grip strength, otherwise 5/5 strength throughout  Nursing note and vitals reviewed.  Assessment/Plan:  CVA - Acute Ischemic Infarct No further recurrent episodes of symptoms x48 hours. Still has persistent minimal weakness in right hand grip and some clumsiness on finger to finger exam but otherwise no neurologic deficits. She has persistent transient drop in blood pressure with standing; 138/75 > 121/71 that is likely contributory to her episode two days ago. Blood pressure stabilizes after remaining standing; BP after 3 minutes of standing was 144/73. Patient with right AKA and does not walk, is wheelchair bound and only does transfers. Should not be an issue moving forward.  -ASA 81 mg dialy -Aggrenox qhs x 2 weeks; then bid -Crestor 20 mg qhs - Working on SNF placement  Hypertension: Goal SBP 130-150 given her left ICA stenosis. Hold antihypertensives. SBP was transiently elevated last night  (185-192) but improved to 136 without any interventions. BP has been mostly in the 130-160 range without any medications. Will continue to hold antihypertensives.   Hyperlipidemia: Total choelsterol 183, LDL 120. Started on Crestor 20 mg daily.  - ContinueRosuvastatin 20mg  daily  Diabetes Mellitus Type 2: History of diabetes. A1c 7.1. Goal Hgb A1c <8.0 given her advanced age. Not on any medications at home. CBGs have been stable during hospitalization with only 1 reading > 200. Required 2 units of insulin yesterday. Overall, very well controlled DM with diet alone. Will not need any medications at discharge.   CKD Stage 3: Stable  Depression: Stable - Continue home duloxetine 30mg   Asthma: Stable. - Continue home budesonide  Neuropathic pain: Stable - Continue home gabapentin 100mg  in AM, 200mg  in evening  Glaucoma: Stable. - Continue home latanoprost 0.005% one drop to both eyes at bedtime - Continue home Systane 1 drop in both eyes BID  Dispo: Stable for discharge pending SNF placement  Valentino NoseNathan Mikhai Bienvenue, MD 07/31/2016, 7:37 AM Pager: [phone]

## 2016-07-31 NOTE — Progress Notes (Signed)
Transitions of Care Pharmacy Note  Plan:  Educated on new Aggrenox and Crestor including indication, dosing and side effects.  Advised patient that discharge paperwork would reflect information about stopping blood pressure medications.  --------------------------------------------- Carolyn IvoryJuanita Werner is an 83 y.o. female who presents with a chief complaint of slurred speech and right hand heaviness. In anticipation of discharge, pharmacy has reviewed this patient's prior to admission medication history, as well as [facility]  Current medication indications, dosing, frequency, and notable side effects reviewed with patient and family. patient and family verbalized understanding of current inpatient medication regimen and are aware that the After Visit Summary when presented, will represent the most accurate medication list at discharge.     Assessment: Understanding of regimen: good Understanding of indications: good Potential of compliance: good Barriers to Obtaining Medications: No  Patient instructed to contact inpatient pharmacy team with further questions or concerns if needed.    Time spent preparing for discharge counseling: 10 minutes Time spent counseling patient: 10 minutes    Thank you for allowing pharmacy to be a part of this patient's care.  Hillis RangeEmily A Stewart, PharmD PGY1 Pharmacy Resident Pager: [phone]

## 2016-07-31 NOTE — Clinical Social Work Placement (Signed)
   CLINICAL SOCIAL WORK PLACEMENT  NOTE  Date:  07/31/2016  Patient Details  Name: Carolyn Werner MRN: [id number] Date of Birth: 05/18/1922  Clinical Social Work is seeking post-discharge placement for this patient at the Skilled  Nursing Facility level of care (*CSW will initial, date and re-position this form in  chart as items are completed):  Yes   Patient/family provided with [facility] Clinical Social Work Department's list of facilities offering this level of care within the geographic area requested by the patient (or if unable, by the patient's family).  Yes   Patient/family informed of their freedom to choose among providers that offer the needed level of care, that participate in Medicare, Medicaid or managed care program needed by the patient, have an available bed and are willing to accept the patient.  Yes   Patient/family informed of [facility]'s ownership interest in [facility] and [facility], as well as of the fact that they are under no obligation to receive care at these facilities.  PASRR submitted to EDS on 07/29/16     PASRR number received on 07/29/16     Existing PASRR number confirmed on       FL2 transmitted to all facilities in geographic area requested by pt/family on 07/29/16     FL2 transmitted to all facilities within larger geographic area on       Patient informed that his/her managed care company has contracts with or will negotiate with certain facilities, including the following:        Yes   Patient/family informed of bed offers received.  [facility]     Physician recommends and patient chooses bed at      [facility] 07[facility].  Patient to be transferred to facility by Ambulance     Patient family notified on 07/31/16 of transfer.  Name of family member notified:  Samara DeistKathryn at bedside     PHYSICIAN Please sign FL2, Please prepare priority discharge summary, including  medications, Please prepare prescriptions     Additional Comment:    _______________________________________________ Venita Lickampbell, Jilian West B, LCSW 07/31/2016, 1:25 PM

## 2016-08-04 ENCOUNTER — Other Ambulatory Visit (HOSPITAL_COMMUNITY): Payer: Federal, State, Local not specified - PPO

## 2016-08-23 ENCOUNTER — Telehealth: Payer: Self-pay | Admitting: Cardiovascular Disease

## 2016-08-23 NOTE — Telephone Encounter (Signed)
Pt daughter calling back for monitor result

## 2016-08-23 NOTE — Telephone Encounter (Signed)
Carolyn HoughKathryn Addo (pt daughter) is returning a call for test results. Please call,thanks.

## 2016-08-23 NOTE — Telephone Encounter (Signed)
Patient's daughter notified of results. Patient has MD OV 08/25/16

## 2016-08-25 ENCOUNTER — Ambulatory Visit: Payer: Federal, State, Local not specified - PPO | Admitting: Cardiovascular Disease

## 2016-09-18 ENCOUNTER — Telehealth: Payer: Self-pay | Admitting: *Deleted

## 2016-09-18 ENCOUNTER — Ambulatory Visit: Payer: Self-pay | Admitting: Nurse Practitioner

## 2016-09-18 NOTE — Telephone Encounter (Signed)
LVM informing patient that her follow up today has been cancelled due to power outage. Left number and advised she call tomorrow and reschedule.

## 2016-09-19 ENCOUNTER — Telehealth: Payer: Self-pay | Admitting: *Deleted

## 2016-09-19 NOTE — Telephone Encounter (Signed)
LVM requesting call back to reschedule follow up with Enid Skeens, NP.

## 2016-09-27 ENCOUNTER — Ambulatory Visit (INDEPENDENT_AMBULATORY_CARE_PROVIDER_SITE_OTHER): Payer: Medicare Other | Admitting: Cardiovascular Disease

## 2016-09-27 ENCOUNTER — Encounter: Payer: Self-pay | Admitting: Cardiovascular Disease

## 2016-09-27 VITALS — BP 135/76 | HR 79 | Ht 63.5 in | Wt 130.0 lb

## 2016-09-27 DIAGNOSIS — E7800 Pure hypercholesterolemia, unspecified: Secondary | ICD-10-CM

## 2016-09-27 DIAGNOSIS — I10 Essential (primary) hypertension: Secondary | ICD-10-CM

## 2016-09-27 DIAGNOSIS — I6523 Occlusion and stenosis of bilateral carotid arteries: Secondary | ICD-10-CM

## 2016-09-27 NOTE — Patient Instructions (Signed)
Medication Instructions:  Your physician recommends that you continue on your current medications as directed. Please refer to the Current Medication list given to you today.  Labwork: none  Testing/Procedures: none  Follow-Up: You have been referred to DR BERRY SAME DAY AS LIPID CLINIC   Your physician recommends that you schedule a follow-up appointment in: PHARM D LIPID CLINIC SAME DAY AS DR BERRY   Your physician wants you to follow-up in: 6 MONTH OV WITH DR [facility]  You will receive a reminder letter in the mail two months in advance. If you don't receive a letter, please call our office to schedule the follow-up appointment.  If you need a refill on your cardiac medications before your next appointment, please call your pharmacy.

## 2016-09-27 NOTE — Progress Notes (Signed)
Cardiology Office Note   Date:  09/27/2016   ID:  Cedar Crest, DOB 1921/11/11, MRN [id number]  PCP:  Lynn Ito, MD  Cardiologist:   Chilton Si, MD   Chief Complaint  Patient presents with  . Follow-up      History of Present Illness: Carolyn Werner is a delightful 83 y.o. 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  Carolyn Werner saw Boyce Medici 07/05/16 for an evaluation of palpitations. She had previously been seen in the emergency department 06/2016 with palpitations. She reported a history of cardiac catheterization 10 years ago after an ETT that was reportedly normal.  She was noted to have PACs on a 24 hour Holter 06/2016.  Thyroid function was within normal limits. Metoprolol was increased to twice daily and she was referred for an echo 07/28/16 that revealed LVEF 60-65% with grade 1 diastolic dysfunction. She was also noted to have mild to moderate aortic regurgitation and mild mitral regurgitation.  Carolyn Werner was admitted 07/2016 with bilateral embolic strokes. Carotid Dopplers revealed 1-39% right ICA stenosis and 80-99% left ICA stenosis.  She was evaluated by Dr. Imogene Burn of vascular surgery. She did not undergo CT-A of the neck due to fear of contrast nephropathy, as she has CKD3 and only one functioning kidney. She declined surgery.  Carolyn Werner has a history of statin myalgias.  She was started on rosuvastatin 20 mg daily. She also has an allergy to clopidogrel so she was started on Aggrenox.   Since being discharged from the hospital Carolyn Werner has been doing well.  She is learning how to write with her right hand again.  She still enjoys reading and reads at least one book per week.  She Denies chest pain or shortness of breath. She has not noted any lower extremity edema. Her only complaint at this time is Phantom limb pain in her right leg. She has to stop  rosuvastatin because it caused weakness. She was previously on atorvastatin but was unable to tolerate this as well.  Her cholesterol was recently checked with her primary care physician. She has not noted any recent palpitations, lightheadedness, or dizziness. Her voice continues to be raspy since her stroke.   Past Medical History:  Diagnosis Date  . Diabetes mellitus without complication ([name])   . Headache   . Hx of benign neoplasm of pituitary gland   . PVD (peripheral vascular disease) ([name])   . Shingles     Past Surgical History:  Procedure Laterality Date  . ABOVE KNEE LEG AMPUTATION Right      Current Outpatient Prescriptions  Medication Sig Dispense Refill  . aspirin EC 81 MG EC tablet Take 1 tablet (81 mg total) by mouth daily. 90 tablet 2  . Biotin 5 MG TABS Take 5 mg by mouth every evening.     . budesonide (PULMICORT) 0.25 MG/2ML nebulizer solution Take 0.25 mg by nebulization 2 (two) times daily.    . Cholecalciferol (VITAMIN D) 2000 units CAPS Take 2,000 Units by mouth daily.    Marland Kitchen dipyridamole-aspirin (AGGRENOX) 200-25 MG 12hr capsule Take once a day at bedtime; after 08/11/16 please take twice a day 90 capsule 2  . docusate sodium (COLACE) 100 MG capsule Take 200 mg by mouth at bedtime.     . DULoxetine (CYMBALTA) 30 MG capsule Take 30 mg by mouth daily.    . Flaxseed, Linseed, (FLAXSEED OIL) 1000 MG  CAPS Take 1,000 mg by mouth 3 (three) times daily.    . fluticasone (FLONASE) 50 MCG/ACT nasal spray Place 1 spray into both nostrils daily as needed for allergies or rhinitis.    Marland Kitchen gabapentin (NEURONTIN) 100 MG capsule Take 1 capsule (100 mg total) by mouth every morning. 30 capsule 2  . gabapentin (NEURONTIN) 100 MG capsule Take 2 capsules (200 mg total) by mouth at bedtime. 90 capsule 2  . latanoprost (XALATAN) 0.005 % ophthalmic solution Place 1 drop into both eyes at bedtime.    . levalbuterol (XOPENEX HFA) 45 MCG/ACT inhaler Inhale 2 puffs into the lungs every 4 (four)  hours as needed for wheezing.    Marland Kitchen loratadine (CLARITIN) 10 MG tablet Take 10 mg by mouth daily as needed for allergies.    . Multiple Vitamin (MULTIVITAMIN WITH MINERALS) TABS tablet Take 1 tablet by mouth daily.    Marland Kitchen nystatin ointment (MYCOSTATIN) Apply 1 application topically daily as needed (for irritation).    Bertram Gala Glycol-Propyl Glycol (SYSTANE) 0.4-0.3 % SOLN Apply 1 drop to eye 2 (two) times daily as needed (for dry eyes).    . sodium chloride (OCEAN) 0.65 % SOLN nasal spray Place 1 spray into both nostrils daily as needed for congestion.     No current facility-administered medications for this visit.     Allergies:   Atacand hct [candesartan cilexetil-hctz]; Carbamazepine; Erythromycin; Glucophage [metformin hcl]; Lipitor [atorvastatin]; Nsaids; Sulfa antibiotics; Ceclor [cefaclor]; Clindamycin/lincomycin; Dilaudid [hydromorphone hcl]; Penicillins; and Plavix [clopidogrel]    Social History:  The patient  reports that she quit smoking about 43 years ago. She has a 5.75 pack-year smoking history. She has never used smokeless tobacco. She reports that she does not drink alcohol or use drugs.   Family History:  The patient's family history includes Hypertension in her daughter.    ROS:  Please see the history of present illness.   Otherwise, review of systems are positive for none.   All other systems are reviewed and negative.    PHYSICAL EXAM: VS:  BP 135/76   Pulse 79   Ht 5' 3.5" (1.613 m)   Wt 59 kg (130 lb)   BMI 22.67 kg/m  , BMI Body mass index is 22.67 kg/m. GENERAL:  Well appearing HEENT:  Pupils equal round and reactive, fundi not visualized, oral mucosa unremarkable NECK:  No jugular venous distention, waveform within normal limits, carotid upstroke brisk and symmetric, no bruits, no thyromegaly LYMPHATICS:  No cervical adenopathy LUNGS:  Clear to auscultation bilaterally HEART:  RRR.  PMI not displaced or sustained,S1 and S2 within normal limits, no S3, no  S4, no clicks, no rubs, II/VI systolic and diastolic murmurs at the LUSB ABD:  Flat, positive bowel sounds normal in frequency in pitch, no bruits, no rebound, no guarding, no midline pulsatile mass, no hepatomegaly, no splenomegaly EXT:  2 plus pulses throughout, no edema, no cyanosis no clubbing SKIN:  No rashes no nodules NEURO:  Cranial nerves II through XII grossly intact, motor grossly intact throughout PSYCH:  Cognitively intact, oriented to person place and time    EKG:  EKG is not ordered today.  Carotid Doppler 07/28/16: 1-39% right ICA stenosis. 80-99% left ICA stenosis   Echo 07/28/16: Study Conclusions  - Left ventricle: The cavity size was normal. There was mild focal   basal hypertrophy of the septum. Systolic function was normal.   The estimated ejection fraction was in the range of 60% to 65%.   Wall motion  was normal; there were no regional wall motion   abnormalities. There was an increased relative contribution of   atrial contraction to ventricular filling. Doppler parameters are   consistent with abnormal left ventricular relaxation (grade 1   diastolic dysfunction). - Aortic valve: There was mild to moderate regurgitation.   Regurgitation pressure half-time: 403 ms. - Mitral valve: Calcified annulus. Moderate diffuse calcification   of the anterior leaflet. There was mild regurgitation. - Left atrium: The atrium was mildly dilated. - Atrial septum: There was increased thickness of the septum,   consistent with lipomatous hypertrophy.  21 day event monitor 07/13/16: Quality: Fair.  Baseline artifact. Predominant rhythm: sinus rhythm  PACs noted No arrhythmias   Recent Labs: 07/27/2016: ALT 14; BUN 15; Creatinine, Ser 1.17; Hemoglobin 12.4; Platelets 139; Potassium 4.1; Sodium 136    Lipid Panel    Component Value Date/Time   CHOL 183 07/28/2016 0229   TRIG 171 (H) 07/28/2016 0229   HDL 29 (L) 07/28/2016 0229   CHOLHDL 6.3 07/28/2016 0229   VLDL  34 07/28/2016 0229   LDLCALC 120 (H) 07/28/2016 0229      Wt Readings from Last 3 Encounters:  09/27/16 59 kg (130 lb)  07/27/16 61.3 kg (135 lb 3.2 oz)  11/13/14 59 kg (130 lb)      ASSESSMENT AND PLAN:  # Carotid stenosis: # Recent stroke: # Hyperlipidemia: Carolyn Werner had a stroke 07/2016 and has minimal residual deficits. She has 80-99% L ICA stenosis. She was not interested in carotid endarterectomy. She would consider percutaneous intervention. She would be high risk given her age and chronic renal insufficiency. We will refer her to Dr. Allyson Sabal for further consideration.  She is reluctant to try another statin.  She previously tried atorvastatin and rosuvastatin.  Continue aspirin and Aggrenox.   # Hypertension: BP is little high.  Given her age and carotid disease we will allow permissive hypertension.  # PACs: Currently asymptomatic.  Current medicines are reviewed at length with the patient today.  The patient does not have concerns regarding medicines.  The following changes have been made:  no change  Labs/ tests ordered today include:  No orders of the defined types were placed in this encounter.   Disposition:   FU with Jaxxon Naeem C. Duke Salvia, MD, [facility] in 6 months    This note was written with the assistance of speech recognition software.  Please excuse any transcriptional errors.  Signed, Kenora Spayd C. Duke Salvia, MD, [facility]  09/27/2016 12:52 PM    [facility]

## 2016-10-05 ENCOUNTER — Ambulatory Visit (INDEPENDENT_AMBULATORY_CARE_PROVIDER_SITE_OTHER): Payer: Medicare Other | Admitting: Nurse Practitioner

## 2016-10-05 ENCOUNTER — Encounter: Payer: Self-pay | Admitting: Nurse Practitioner

## 2016-10-05 VITALS — BP 138/74 | HR 76 | Ht 64.0 in | Wt 130.0 lb

## 2016-10-05 DIAGNOSIS — I6523 Occlusion and stenosis of bilateral carotid arteries: Secondary | ICD-10-CM

## 2016-10-05 DIAGNOSIS — I639 Cerebral infarction, unspecified: Secondary | ICD-10-CM

## 2016-10-05 DIAGNOSIS — I10 Essential (primary) hypertension: Secondary | ICD-10-CM

## 2016-10-05 DIAGNOSIS — R011 Cardiac murmur, unspecified: Secondary | ICD-10-CM

## 2016-10-05 DIAGNOSIS — E785 Hyperlipidemia, unspecified: Secondary | ICD-10-CM

## 2016-10-05 DIAGNOSIS — N183 Chronic kidney disease, stage 3 unspecified: Secondary | ICD-10-CM

## 2016-10-05 NOTE — Patient Instructions (Addendum)
Stressed the importance of management of risk factors to prevent further stroke Continue aspirin and Aggrenox for secondary stroke prevention Maintain strict control of hypertension with blood pressure goal below 130/90, today's reading 138/74 Control of diabetes with hemoglobin A1c below 7.0 followed by primary care most recent hemoglobin A1c 7.1  Cholesterol with LDL cholesterol less than 70, followed by primary care,  most recent 120 has stopped Crestor  And Cardiology considering Repatha Eat healthy diet with whole grains,  fresh fruits and vegetables Follow up in 6 months Evolocumab injection What is this medicine? EVOLOCUMAB (e voe LOK ue mab) is known as a PCSK9 inhibitor. It is used to lower the level of cholesterol in the blood. It may be used alone or in combination with other cholesterol-lowering drugs. This drug may also be used to reduce the risk of heart attack, stroke, and certain types of heart surgery in patients with heart disease. This medicine may be used for other purposes; ask your health care provider or pharmacist if you have questions. COMMON BRAND NAME(S): REPATHA What should I tell my health care provider before I take this medicine? They need to know if you have any of these conditions: -an unusual or allergic reaction to evolocumab, other medicines, foods, dyes, or preservatives -pregnant or trying to get pregnant -breast-feeding How should I use this medicine? This medicine is for injection under the skin. You will be taught how to prepare and give this medicine. Use exactly as directed. Take your medicine at regular intervals. Do not take your medicine more often than directed. It is important that you put your used needles and syringes in a special sharps container. Do not put them in a trash can. If you do not have a sharps container, call your pharmacist or health care provider to get one. Talk to your pediatrician regarding the use of this medicine in children.  While this drug may be prescribed for children as young as 13 years for selected conditions, precautions do apply. Overdosage: If you think you have taken too much of this medicine contact a poison control center or emergency room at once. NOTE: This medicine is only for you. Do not share this medicine with others. What if I miss a dose? If you miss a dose, take it as soon as you can if there are more than 7 days until the next scheduled dose, or skip the missed dose and take the next dose according to your original schedule. Do not take double or extra doses. What may interact with this medicine? Interactions are not expected. This list may not describe all possible interactions. Give your health care provider a list of all the medicines, herbs, non-prescription drugs, or dietary supplements you use. Also tell them if you smoke, drink alcohol, or use illegal drugs. Some items may interact with your medicine. What should I watch for while using this medicine? You may need blood work while you are taking this medicine. What side effects may I notice from receiving this medicine? Side effects that you should report to your doctor or health care professional as soon as possible: -allergic reactions like skin rash, itching or hives, swelling of the face, lips, or tongue -signs and symptoms of infection like fever or chills; cough; sore throat; pain or trouble passing urine Side effects that usually do not require medical attention (report to your doctor or health care professional if they continue or are bothersome): -diarrhea -nausea -muscle pain -pain, redness, or irritation at site where  injected This list may not describe all possible side effects. Call your doctor for medical advice about side effects. You may report side effects to FDA at 1-800-FDA-1088. Where should I keep my medicine? Keep out of the reach of children. You will be instructed on how to store this medicine. Throw away any  unused medicine after the expiration date on the label. NOTE: This sheet is a summary. It may not cover all possible information. If you have questions about this medicine, talk to your doctor, pharmacist, or health care provider.  2018 Elsevier/Gold Standard (2016-05-08 13:21:53)

## 2016-10-05 NOTE — Progress Notes (Signed)
[facility]  PATIENT: Carolyn Werner DOB: 02/29/1922   REASON FOR VISIT: Hospital follow-up for stroke HISTORY FROM: Patient    HISTORY OF PRESENT ILLNESS: [name], 83 year old female was admitted to the hospital presenting with bilateral upper extremity weakness. She has medical history of hypertension diabetes stage III kidney disease peripheral vascular disease status post right above-knee amputation. MRI of the brain multiple small bilateral pontine and cerebellar infarcts. MRA left PCA and left VA occluded left cavernous ICA high-grade stenosis. Carotid Doppler left ICA 80-99% stenosis. 1-39% on the right. 2-D echo 60-65% EF LDL 120. Hemoglobin A1c 7.1. She was on aspirin prior to admission and switch to Aggrenox. Patient is allergic to Plavix. Conservative treatment was recommended for her left carotid stenosis. She was not on statin prior to admission placed on  Crestor 20 mg. Patient returns to the stroke clinic for follow-up. She was placed on baby aspirin along with Aggrenox at discharge for secondary stroke prevention. She has not had recurrent stroke or TIA symptoms. She has minimal bruising and no bleeding. She was also on Crestor however she stopped the medication due to weakness. She has since seen cardiology who discussed Repatha. She is to return later in the month for follow-up.  Blood pressure in the office today 138/74. She is seated in a wheelchair she is a right above-knee amputation. After discharge she went to skilled nursing facility for 10 days and then back home  where she received occupational therapy for approximately 6 weeks. That stopped last Friday. She lives with her daughter. She remains independent in all activities of daily living to include feeding dressing bathing. She no longer cooks. She continues to complain with a raspy voice since her stroke. She denies any dizziness or lightheadedness. She is learning to write again with her right  hand She returns for reevaluation     REVIEW OF SYSTEMS: Full 14 system review of systems performed and notable only for those listed, all others are neg:  Constitutional: neg  Cardiovascular: neg Ear/Nose/Throat: Hearing loss Skin: neg Eyes: Glaucoma Respiratory: neg Gastroitestinal: neg  Hematology/Lymphatic: neg  Endocrine: neg Musculoskeletal: Joint pain right above-knee amputation Allergy/Immunology: Food allergies Neurological: Numbness Psychiatric: Depression Sleep : neg   ALLERGIES: Allergies  Allergen Reactions  . Atacand Hct [Candesartan Cilexetil-Hctz] Other (See Comments)    Kidney disease  . Carbamazepine Other (See Comments)    Changes in ability to read, think clearly, unsteady gait  . Erythromycin Diarrhea  . Glucophage [Metformin Hcl] Other (See Comments)    Chronic low blood sugar   . Lipitor [Atorvastatin] Other (See Comments)    Muscle weakness  . Nsaids Nausea And Vomiting    Motrin, Naprosyn, Voltaren, Celebrex, Prevacid, Mobic -- abdominal pain and burning, cramps, loose stool  . Sulfa Antibiotics Anaphylaxis  . Ceclor [Cefaclor] Hives  . Clindamycin/Lincomycin Other (See Comments)    Abdominal pain, loss of appetite  . Dilaudid [Hydromorphone Hcl] Rash  . Penicillins Rash  . Plavix [Clopidogrel] Other (See Comments)    HOME MEDICATIONS: Outpatient Medications Prior to Visit  Medication Sig Dispense Refill  . aspirin EC 81 MG EC tablet Take 1 tablet (81 mg total) by mouth daily. 90 tablet 2  . Biotin 5 MG TABS Take 5 mg by mouth every evening.     . budesonide (PULMICORT) 0.25 MG/2ML nebulizer solution Take 0.25 mg by nebulization 2 (two) times daily.    . Cholecalciferol (VITAMIN D) 2000 units CAPS Take 2,000 Units by mouth daily.    .Marland Kitchen  dipyridamole-aspirin (AGGRENOX) 200-25 MG 12hr capsule Take once a day at bedtime; after 08/11/16 please take twice a day 90 capsule 2  . docusate sodium (COLACE) 100 MG capsule Take 200 mg by mouth at  bedtime.     . DULoxetine (CYMBALTA) 30 MG capsule Take 30 mg by mouth daily.    . Flaxseed, Linseed, (FLAXSEED OIL) 1000 MG CAPS Take 1,000 mg by mouth 3 (three) times daily.    . fluticasone (FLONASE) 50 MCG/ACT nasal spray Place 1 spray into both nostrils daily as needed for allergies or rhinitis.    Marland Kitchen gabapentin (NEURONTIN) 100 MG capsule Take 1 capsule (100 mg total) by mouth every morning. 30 capsule 2  . gabapentin (NEURONTIN) 100 MG capsule Take 2 capsules (200 mg total) by mouth at bedtime. 90 capsule 2  . latanoprost (XALATAN) 0.005 % ophthalmic solution Place 1 drop into both eyes at bedtime.    . levalbuterol (XOPENEX HFA) 45 MCG/ACT inhaler Inhale 2 puffs into the lungs every 4 (four) hours as needed for wheezing.    Marland Kitchen loratadine (CLARITIN) 10 MG tablet Take 10 mg by mouth daily as needed for allergies.    . Multiple Vitamin (MULTIVITAMIN WITH MINERALS) TABS tablet Take 1 tablet by mouth daily.    Marland Kitchen nystatin ointment (MYCOSTATIN) Apply 1 application topically daily as needed (for irritation).    Bertram Gala Glycol-Propyl Glycol (SYSTANE) 0.4-0.3 % SOLN Apply 1 drop to eye 2 (two) times daily as needed (for dry eyes).    . sodium chloride (OCEAN) 0.65 % SOLN nasal spray Place 1 spray into both nostrils daily as needed for congestion.     No facility-administered medications prior to visit.     PAST MEDICAL HISTORY: Past Medical History:  Diagnosis Date  . Diabetes mellitus without complication ([name])   . Headache   . Hx of benign neoplasm of pituitary gland   . PVD (peripheral vascular disease) ([name])   . Shingles     PAST SURGICAL HISTORY: Past Surgical History:  Procedure Laterality Date  . ABOVE KNEE LEG AMPUTATION Right     FAMILY HISTORY: Family History  Problem Relation Age of Onset  . Dementia    . Hypertension Daughter     SOCIAL HISTORY: Social History   Social History  . Marital status: Unknown    Spouse name: N/A  . Number of children: N/A  . Years  of education: N/A   Occupational History  . Not on file.   Social History Main Topics  . Smoking status: Former Smoker    Packs/day: 0.25    Years: 23.00    Quit date: 06/05/1973  . Smokeless tobacco: Never Used  . Alcohol use No  . Drug use: No  . Sexual activity: No   Other Topics Concern  . Not on file   Social History Narrative  . No narrative on file     PHYSICAL EXAM  Vitals:   10/05/16 0813  BP: 138/74  Pulse: 76  Weight: 130 lb (59 kg)  Height: 5\' 4"  (1.626 m)   Body mass index is 22.31 kg/m.  Generalized: Well developed, in no acute distress , Well-groomed Head: normocephalic and atraumatic,. Oropharynx benign  Neck: Supple, no carotid bruits  Cardiac: Regular rate rhythm,  murmur Audible at left upper sternal border Musculoskeletal: No deformity   Neurological examination   Mentation: Alert oriented to time, place, history taking. Attention span and concentration appropriate. Recent and remote memory intact.  Follows all commands speech and  language fluent.   Cranial nerve II-XII: Pupils were equal round reactive to light extraocular movements were full, visual field were full on confrontational test. Facial sensation and strength were normal. hearing was intact to finger rubbing bilaterally. Uvula tongue midline. head turning and shoulder shrug were normal and symmetric.Tongue protrusion into cheek strength was normal. Motor: normal bulk and tone, full strength in the BUE, BLE, fine finger movements normal, no pronator drift. No focal weakness Sensory: normal and symmetric to light touch, pinprick, and  Vibration, in the right upper extremity decreased to pinprick in the left upper extremity and left lower extremity   Coordination: finger-nose-finger,  no dysmetria Reflexes: 1+ upper lower and symmetric plantar responses were flexor bilaterally. Gait and Station: Not tested due to right above-knee amputation and no prosthesis  DIAGNOSTIC DATA (LABS,  IMAGING, TESTING) - I reviewed patient records, labs, notes, testing and imaging myself where available.  Lab Results  Component Value Date   WBC 6.3 07/27/2016   HGB 12.4 07/27/2016   HCT 38.1 07/27/2016   MCV 87.8 07/27/2016   PLT 139 (L) 07/27/2016      Component Value Date/Time   NA 136 07/27/2016 1111   K 4.1 07/27/2016 1111   CL 105 07/27/2016 1111   CO2 24 07/27/2016 1111   GLUCOSE 253 (H) 07/27/2016 1111   BUN 15 07/27/2016 1111   CREATININE 1.17 (H) 07/27/2016 1111   CALCIUM 10.0 07/27/2016 1111   PROT 6.3 (L) 07/27/2016 1111   ALBUMIN 3.0 (L) 07/27/2016 1111   AST 26 07/27/2016 1111   ALT 14 07/27/2016 1111   ALKPHOS 75 07/27/2016 1111   BILITOT 0.7 07/27/2016 1111   GFRNONAA 39 (L) 07/27/2016 1111   GFRAA 45 (L) 07/27/2016 1111   Lab Results  Component Value Date   CHOL 183 07/28/2016   HDL 29 (L) 07/28/2016   LDLCALC 120 (H) 07/28/2016   TRIG 171 (H) 07/28/2016   CHOLHDL 6.3 07/28/2016   Lab Results  Component Value Date   HGBA1C 7.1 (H) 07/28/2016     ASSESSMENT AND PLAN  82 y.o. year old female  has a past medical history of Diabetes mellitus without complication ([name]); Headache;  PVD (peripheral vascular disease) ([name]); And recent hospital admission for stroke MRI of the brain multiple small bilateral pontine and cerebellar infarcts. MRA left PCA and left VA occluded left cavernous ICA high-grade stenosis. Carotid Doppler left ICA 80-99% stenosis. 1-39% on the right. 2-D echo 60-65% EF LDL 120.The patient is a current patient of Dr. Roda Shutters  who is out of the office today . This note is sent to the work in doctor.      PLAN: Stressed the importance of management of risk factors to prevent further stroke Continue aspirin and Aggrenox for secondary stroke prevention Maintain strict control of hypertension with blood pressure goal below 130/90, today's reading 138/74 Control of diabetes with hemoglobin A1c below 7.0 followed by primary care most recent  hemoglobin A1c 7.1  Cholesterol with LDL cholesterol less than 70, followed by primary care,  most recent 120 has stopped Crestor  And Cardiology considering Repatha she was also referred to Dr. Allyson Sabal  Eat healthy diet with whole grains,  fresh fruits and vegetables Follow up in 6 months Discussed risk for recurrent stroke/ TIA and answered additional questions This was a visit requiring 30 minutes and medical decision making of high complexity with extensive review of history, hospital chart, counseling and answering questions for patient and daughter. She was given  some information on Repatha since she has failed statins in the past Nilda Riggs, [facility], [facility], APRN  [facility] [address] [place], [place] [address] [phone]

## 2016-10-05 NOTE — Progress Notes (Signed)
I have read the note, and I agree with the clinical assessment and plan.  Carolyn Werner,Carolyn Werner   

## 2016-11-01 ENCOUNTER — Ambulatory Visit: Payer: Medicare Other

## 2016-11-01 ENCOUNTER — Ambulatory Visit: Payer: Medicare Other | Admitting: Cardiovascular Disease

## 2017-01-26 ENCOUNTER — Encounter (HOSPITAL_COMMUNITY): Payer: Self-pay | Admitting: Emergency Medicine

## 2017-01-26 ENCOUNTER — Inpatient Hospital Stay (HOSPITAL_COMMUNITY)
Admission: EM | Admit: 2017-01-26 | Discharge: 2017-01-30 | DRG: 065 | Disposition: A | Discharge status: Home / Self Care | Payer: Medicare Other | Attending: Internal Medicine | Admitting: Internal Medicine

## 2017-01-26 ENCOUNTER — Emergency Department (HOSPITAL_COMMUNITY): Payer: Medicare Other

## 2017-01-26 DIAGNOSIS — Z885 Allergy status to narcotic agent status: Secondary | ICD-10-CM

## 2017-01-26 DIAGNOSIS — I6330 Cerebral infarction due to thrombosis of unspecified cerebral artery: Secondary | ICD-10-CM | POA: Diagnosis not present

## 2017-01-26 DIAGNOSIS — M5030 Other cervical disc degeneration, unspecified cervical region: Secondary | ICD-10-CM | POA: Diagnosis present

## 2017-01-26 DIAGNOSIS — R29898 Other symptoms and signs involving the musculoskeletal system: Secondary | ICD-10-CM | POA: Diagnosis present

## 2017-01-26 DIAGNOSIS — E1142 Type 2 diabetes mellitus with diabetic polyneuropathy: Secondary | ICD-10-CM | POA: Diagnosis present

## 2017-01-26 DIAGNOSIS — M545 Low back pain: Secondary | ICD-10-CM | POA: Diagnosis present

## 2017-01-26 DIAGNOSIS — G546 Phantom limb syndrome with pain: Secondary | ICD-10-CM | POA: Diagnosis present

## 2017-01-26 DIAGNOSIS — K581 Irritable bowel syndrome with constipation: Secondary | ICD-10-CM | POA: Diagnosis present

## 2017-01-26 DIAGNOSIS — Z89611 Acquired absence of right leg above knee: Secondary | ICD-10-CM

## 2017-01-26 DIAGNOSIS — R270 Ataxia, unspecified: Secondary | ICD-10-CM | POA: Diagnosis present

## 2017-01-26 DIAGNOSIS — Z888 Allergy status to other drugs, medicaments and biological substances status: Secondary | ICD-10-CM

## 2017-01-26 DIAGNOSIS — E1122 Type 2 diabetes mellitus with diabetic chronic kidney disease: Secondary | ICD-10-CM | POA: Diagnosis present

## 2017-01-26 DIAGNOSIS — G8929 Other chronic pain: Secondary | ICD-10-CM | POA: Diagnosis present

## 2017-01-26 DIAGNOSIS — I672 Cerebral atherosclerosis: Secondary | ICD-10-CM | POA: Diagnosis present

## 2017-01-26 DIAGNOSIS — G459 Transient cerebral ischemic attack, unspecified: Secondary | ICD-10-CM | POA: Diagnosis not present

## 2017-01-26 DIAGNOSIS — Z87891 Personal history of nicotine dependence: Secondary | ICD-10-CM

## 2017-01-26 DIAGNOSIS — R531 Weakness: Secondary | ICD-10-CM

## 2017-01-26 DIAGNOSIS — I10 Essential (primary) hypertension: Secondary | ICD-10-CM

## 2017-01-26 DIAGNOSIS — Z880 Allergy status to penicillin: Secondary | ICD-10-CM

## 2017-01-26 DIAGNOSIS — K580 Irritable bowel syndrome with diarrhea: Secondary | ICD-10-CM | POA: Diagnosis present

## 2017-01-26 DIAGNOSIS — E785 Hyperlipidemia, unspecified: Secondary | ICD-10-CM | POA: Diagnosis present

## 2017-01-26 DIAGNOSIS — R29702 NIHSS score 2: Secondary | ICD-10-CM | POA: Diagnosis present

## 2017-01-26 DIAGNOSIS — G8191 Hemiplegia, unspecified affecting right dominant side: Secondary | ICD-10-CM | POA: Diagnosis present

## 2017-01-26 DIAGNOSIS — E1151 Type 2 diabetes mellitus with diabetic peripheral angiopathy without gangrene: Secondary | ICD-10-CM | POA: Diagnosis present

## 2017-01-26 DIAGNOSIS — Z993 Dependence on wheelchair: Secondary | ICD-10-CM

## 2017-01-26 DIAGNOSIS — I69393 Ataxia following cerebral infarction: Secondary | ICD-10-CM

## 2017-01-26 DIAGNOSIS — Z8673 Personal history of transient ischemic attack (TIA), and cerebral infarction without residual deficits: Secondary | ICD-10-CM

## 2017-01-26 DIAGNOSIS — R4781 Slurred speech: Secondary | ICD-10-CM | POA: Diagnosis present

## 2017-01-26 DIAGNOSIS — I129 Hypertensive chronic kidney disease with stage 1 through stage 4 chronic kidney disease, or unspecified chronic kidney disease: Secondary | ICD-10-CM | POA: Diagnosis present

## 2017-01-26 DIAGNOSIS — Z7982 Long term (current) use of aspirin: Secondary | ICD-10-CM

## 2017-01-26 DIAGNOSIS — J4530 Mild persistent asthma, uncomplicated: Secondary | ICD-10-CM | POA: Diagnosis present

## 2017-01-26 DIAGNOSIS — N183 Chronic kidney disease, stage 3 unspecified: Secondary | ICD-10-CM

## 2017-01-26 DIAGNOSIS — Z79899 Other long term (current) drug therapy: Secondary | ICD-10-CM

## 2017-01-26 DIAGNOSIS — N182 Chronic kidney disease, stage 2 (mild): Secondary | ICD-10-CM | POA: Diagnosis present

## 2017-01-26 DIAGNOSIS — Z882 Allergy status to sulfonamides status: Secondary | ICD-10-CM

## 2017-01-26 DIAGNOSIS — K5909 Other constipation: Secondary | ICD-10-CM | POA: Diagnosis present

## 2017-01-26 HISTORY — DX: Irritable bowel syndrome, unspecified: K58.9

## 2017-01-26 HISTORY — DX: Mild persistent asthma, uncomplicated: J45.30

## 2017-01-26 HISTORY — DX: Cerebral infarction, unspecified: I63.9

## 2017-01-26 HISTORY — DX: Other cervical disc degeneration, unspecified cervical region: M50.30

## 2017-01-26 LAB — COMPREHENSIVE METABOLIC PANEL
ALT: 15 U/L (ref 14–54)
AST: 24 U/L (ref 15–41)
Albumin: 3.2 g/dL — ABNORMAL LOW (ref 3.5–5.0)
Alkaline Phosphatase: 68 U/L (ref 38–126)
Anion gap: 8 (ref 5–15)
BUN: 14 mg/dL (ref 6–20)
CHLORIDE: 104 mmol/L (ref 101–111)
CO2: 23 mmol/L (ref 22–32)
Calcium: 10.1 mg/dL (ref 8.9–10.3)
Creatinine, Ser: 1.23 mg/dL — ABNORMAL HIGH (ref 0.44–1.00)
GFR, EST AFRICAN AMERICAN: 42 mL/min — AB (ref >60)
GFR, EST NON AFRICAN AMERICAN: 37 mL/min — AB (ref >60)
Glucose, Bld: 219 mg/dL — ABNORMAL HIGH (ref 65–99)
POTASSIUM: 3.9 mmol/L (ref 3.5–5.1)
SODIUM: 135 mmol/L (ref 135–145)
Total Bilirubin: 0.5 mg/dL (ref 0.3–1.2)
Total Protein: 6.8 g/dL (ref 6.5–8.1)

## 2017-01-26 LAB — PROTIME-INR
INR: 1.08
PROTHROMBIN TIME: 14.1 s (ref 11.4–15.2)

## 2017-01-26 LAB — LIPID PANEL
CHOLESTEROL: 206 mg/dL — AB (ref 0–200)
HDL: 29 mg/dL — AB (ref >40)
LDL CALC: 134 mg/dL — AB (ref 0–99)
TRIGLYCERIDES: 217 mg/dL — AB (ref <150)
Total CHOL/HDL Ratio: 7.1 RATIO
VLDL: 43 mg/dL — ABNORMAL HIGH (ref 0–40)

## 2017-01-26 LAB — DIFFERENTIAL
BASOS ABS: 0 10*3/uL (ref 0.0–0.1)
BASOS PCT: 0 %
EOS ABS: 0.1 10*3/uL (ref 0.0–0.7)
Eosinophils Relative: 2 %
Lymphocytes Relative: 30 %
Lymphs Abs: 1.7 10*3/uL (ref 0.7–4.0)
MONO ABS: 0.3 10*3/uL (ref 0.1–1.0)
MONOS PCT: 5 %
Neutro Abs: 3.5 10*3/uL (ref 1.7–7.7)
Neutrophils Relative %: 63 %

## 2017-01-26 LAB — CBC
HEMATOCRIT: 37.2 % (ref 36.0–46.0)
Hemoglobin: 12.3 g/dL (ref 12.0–15.0)
MCH: 29.2 pg (ref 26.0–34.0)
MCHC: 33.1 g/dL (ref 30.0–36.0)
MCV: 88.4 fL (ref 78.0–100.0)
PLATELETS: 154 10*3/uL (ref 150–400)
RBC: 4.21 MIL/uL (ref 3.87–5.11)
RDW: 12.1 % (ref 11.5–15.5)
WBC: 5.6 10*3/uL (ref 4.0–10.5)

## 2017-01-26 LAB — GLUCOSE, CAPILLARY: Glucose-Capillary: 114 mg/dL — ABNORMAL HIGH (ref 65–99)

## 2017-01-26 LAB — APTT: aPTT: 40 seconds — ABNORMAL HIGH (ref 24–36)

## 2017-01-26 LAB — I-STAT CHEM 8, ED
BUN: 17 mg/dL (ref 6–20)
Calcium, Ion: 1.31 mmol/L (ref 1.15–1.40)
Chloride: 102 mmol/L (ref 101–111)
Creatinine, Ser: 1.2 mg/dL — ABNORMAL HIGH (ref 0.44–1.00)
Glucose, Bld: 225 mg/dL — ABNORMAL HIGH (ref 65–99)
HEMATOCRIT: 40 % (ref 36.0–46.0)
HEMOGLOBIN: 13.6 g/dL (ref 12.0–15.0)
POTASSIUM: 3.9 mmol/L (ref 3.5–5.1)
Sodium: 137 mmol/L (ref 135–145)
TCO2: 23 mmol/L (ref 22–32)

## 2017-01-26 LAB — HEMOGLOBIN A1C
HEMOGLOBIN A1C: 6.9 % — AB (ref 4.8–5.6)
MEAN PLASMA GLUCOSE: 151.33 mg/dL

## 2017-01-26 LAB — I-STAT TROPONIN, ED: TROPONIN I, POC: 0 ng/mL (ref 0.00–0.08)

## 2017-01-26 MED ORDER — LEVALBUTEROL HCL 0.63 MG/3ML IN NEBU: 0.6300 mg | INHALATION_SOLUTION | Freq: Four times a day (QID) | RESPIRATORY_TRACT | Status: DC | PRN

## 2017-01-26 MED ORDER — ENOXAPARIN SODIUM 40 MG/0.4ML ~~LOC~~ SOLN
40.0000 mg | Freq: Every day | SUBCUTANEOUS | Status: DC
  Administered 2017-01-26: 40 mg via SUBCUTANEOUS
  Filled 2017-01-26: qty 0.4

## 2017-01-26 MED ORDER — SALINE SPRAY 0.65 % NA SOLN: 1.0000 | Freq: Every day | NASAL | Status: DC | PRN

## 2017-01-26 MED ORDER — ADULT MULTIVITAMIN W/MINERALS CH
1.0000 | ORAL_TABLET | Freq: Every day | ORAL | Status: DC
  Administered 2017-01-27 – 2017-01-30 (×4): 1 via ORAL
  Filled 2017-01-26 (×4): qty 1

## 2017-01-26 MED ORDER — ASPIRIN EC 81 MG PO TBEC: 81.0000 mg | DELAYED_RELEASE_TABLET | Freq: Every day | ORAL | Status: DC

## 2017-01-26 MED ORDER — ACETAMINOPHEN 650 MG RE SUPP: 650.0000 mg | RECTAL | Status: DC | PRN

## 2017-01-26 MED ORDER — LORATADINE 10 MG PO TABS
10.0000 mg | ORAL_TABLET | Freq: Every day | ORAL | Status: DC
  Administered 2017-01-27 – 2017-01-30 (×4): 10 mg via ORAL
  Filled 2017-01-26 (×4): qty 1

## 2017-01-26 MED ORDER — POLYETHYLENE GLYCOL 3350 17 G PO PACK: 17.0000 g | PACK | Freq: Every day | ORAL | Status: DC | PRN

## 2017-01-26 MED ORDER — BIOTIN 5 MG PO TABS: 5.0000 mg | ORAL_TABLET | Freq: Every day | ORAL | Status: DC

## 2017-01-26 MED ORDER — STROKE: EARLY STAGES OF RECOVERY BOOK
Freq: Once | Status: AC
  Administered 2017-01-26: 23:00:00

## 2017-01-26 MED ORDER — ACETAMINOPHEN 160 MG/5ML PO SOLN: 650.0000 mg | ORAL | Status: DC | PRN

## 2017-01-26 MED ORDER — DOCUSATE SODIUM 100 MG PO CAPS
200.0000 mg | ORAL_CAPSULE | Freq: Two times a day (BID) | ORAL | Status: DC
  Administered 2017-01-26 – 2017-01-30 (×6): 200 mg via ORAL
  Filled 2017-01-26 (×7): qty 2

## 2017-01-26 MED ORDER — DULOXETINE HCL 30 MG PO CPEP
30.0000 mg | ORAL_CAPSULE | Freq: Every day | ORAL | Status: DC
  Administered 2017-01-27 – 2017-01-30 (×4): 30 mg via ORAL
  Filled 2017-01-26 (×4): qty 1

## 2017-01-26 MED ORDER — TETRAHYDROZOLINE HCL 0.05 % OP SOLN
1.0000 [drp] | Freq: Two times a day (BID) | OPHTHALMIC | Status: DC | PRN
  Filled 2017-01-26: qty 15

## 2017-01-26 MED ORDER — VITAMIN D 1000 UNITS PO TABS
2000.0000 [IU] | ORAL_TABLET | Freq: Every day | ORAL | Status: DC
  Administered 2017-01-27 – 2017-01-30 (×4): 2000 [IU] via ORAL
  Filled 2017-01-26 (×4): qty 2

## 2017-01-26 MED ORDER — LATANOPROST 0.005 % OP SOLN
1.0000 [drp] | Freq: Every day | OPHTHALMIC | Status: DC
  Administered 2017-01-26 – 2017-01-29 (×4): 1 [drp] via OPHTHALMIC
  Filled 2017-01-26: qty 2.5

## 2017-01-26 MED ORDER — GABAPENTIN 100 MG PO CAPS: 100.0000 mg | ORAL_CAPSULE | ORAL | Status: DC

## 2017-01-26 MED ORDER — LEVALBUTEROL TARTRATE 45 MCG/ACT IN AERO: 2.0000 | INHALATION_SPRAY | RESPIRATORY_TRACT | Status: DC | PRN

## 2017-01-26 MED ORDER — FLUTICASONE PROPIONATE 50 MCG/ACT NA SUSP: 2.0000 | Freq: Every day | NASAL | Status: DC | PRN

## 2017-01-26 MED ORDER — GABAPENTIN 100 MG PO CAPS
100.0000 mg | ORAL_CAPSULE | Freq: Every day | ORAL | Status: DC
  Administered 2017-01-27 – 2017-01-30 (×4): 100 mg via ORAL
  Filled 2017-01-26 (×4): qty 1

## 2017-01-26 MED ORDER — ACETAMINOPHEN 325 MG PO TABS
650.0000 mg | ORAL_TABLET | ORAL | Status: DC | PRN
  Administered 2017-01-28 – 2017-01-30 (×2): 650 mg via ORAL
  Filled 2017-01-26 (×2): qty 2

## 2017-01-26 MED ORDER — ASPIRIN-DIPYRIDAMOLE ER 25-200 MG PO CP12
1.0000 | ORAL_CAPSULE | Freq: Two times a day (BID) | ORAL | Status: DC
  Administered 2017-01-26 – 2017-01-28 (×4): 1 via ORAL
  Filled 2017-01-26 (×4): qty 1

## 2017-01-26 MED ORDER — GABAPENTIN 100 MG PO CAPS
200.0000 mg | ORAL_CAPSULE | Freq: Every day | ORAL | Status: DC
  Administered 2017-01-26 – 2017-01-29 (×4): 200 mg via ORAL
  Filled 2017-01-26 (×4): qty 2

## 2017-01-26 NOTE — ED Triage Notes (Signed)
Pt reports RUE weakness on waking this morning, reports slowed speech. Pt reports CVA in Feb 2018, reports R sided deficits with slow, slurred speech.  Pt's daughter reports pt more active than usual this week. Pt denies dysuria, any signs of infection. AOx4, resp e/u.

## 2017-01-26 NOTE — ED Provider Notes (Signed)
[facility]-EMERGENCY DEPT Provider Note   CSN: [id number] Arrival date & time: 01/26/17  1311     History   Chief Complaint Chief Complaint  Patient presents with  . Stroke Symptoms    HPI Carolyn Werner is a 83 y.o. female   Patient is a 83 year old female with a history of diabetes, prior stroke, hyperlipidemia and chronic kidney disease 2 presents with right-sided weakness and slurred speech. She states she woke up with the symptoms this morning. She was normal when she went to bed last night. She feels like her symptoms have improved since she's been in the ED but is not yet back to baseline. She normally is not ambulatory but mobilizes with a wheelchair at home. She denies any recent trauma.      Past Medical History:  Diagnosis Date  . Diabetes mellitus without complication ([name])   . Headache   . Hx of benign neoplasm of pituitary gland   . PVD (peripheral vascular disease) ([name])   . Shingles   . Stroke [facility])     Patient Active Problem List   Diagnosis Date Noted  . Right arm weakness 01/26/2017  . Hyperlipemia 10/05/2016  . Weakness of both arms   . Acute CVA (cerebrovascular accident) ([name]) 07/28/2016  . 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 Slurred speech 07/27/2016  . CKD (chronic kidney disease) stage 3, GFR 30-59 ml/min 07/27/2016  . Diabetes mellitus ([name]) 07/27/2016  . Hypertension 07/27/2016  . Depression 07/27/2016  . Glaucoma 07/27/2016  . Neuropathic pain 07/27/2016  . Asthma 07/27/2016  . Heart palpitations 07/05/2016  . Cardiac murmur 07/05/2016  . Phantom limb pain ([name]) 11/13/2014  . Cervical disc disorder with radiculopathy of cervical region 11/13/2014    Past Surgical History:  Procedure Laterality Date  . ABOVE KNEE LEG AMPUTATION Right     OB History    No data available       Home Medications    Prior to Admission medications   Medication Sig Start Date End Date Taking? Authorizing  Provider  acetaminophen (TYLENOL) 500 MG tablet Take 500-1,000 mg by mouth every 6 (six) hours as needed (pain).   Yes [provider]  amLODipine (NORVASC) 2.5 MG tablet Take 2.5 mg by mouth daily with supper.  12/13/16  Yes [provider]  aspirin EC 81 MG EC tablet Take 1 tablet (81 mg total) by mouth daily. 08/01/16  Yes Valentino Nose, MD  Biotin 5 MG TABS Take 5 mg by mouth daily with supper.    Yes [provider]  budesonide (PULMICORT) 0.25 MG/2ML nebulizer solution Take 0.25 mg by nebulization See admin instructions. Inhale 1 vial (0.25 mg) via nebulization every other night   Yes [provider]  Cholecalciferol (VITAMIN D) 2000 units CAPS Take 2,000 Units by mouth daily.   Yes [provider]  dipyridamole-aspirin (AGGRENOX) 200-25 MG 12hr capsule Take once a day at bedtime; after 08/11/16 please take twice a day Patient taking differently: Take 1 capsule by mouth 2 (two) times daily.  07/31/16  Yes Valentino Nose, MD  docusate sodium (COLACE) 100 MG capsule Take 200 mg by mouth 2 (two) times daily.    Yes [provider]  DULoxetine (CYMBALTA) 30 MG capsule Take 30 mg by mouth daily.   Yes [provider]  Flaxseed, Linseed, (FLAXSEED OIL) 1000 MG CAPS Take 1,000 mg by mouth 3 (three) times daily.   Yes [provider]  fluticasone Aleda Grana)  50 MCG/ACT nasal spray Place 1 spray into both nostrils daily as needed for allergies or rhinitis.   Yes [provider]  gabapentin (NEURONTIN) 100 MG capsule Take 1 capsule (100 mg total) by mouth every morning. Patient taking differently: Take 100-200 mg by mouth See admin instructions. Take 1 capsule (100 mg) by mouth every morning and 2 capsules (200 mg) at night 08/01/16  Yes Valentino Nose, MD  latanoprost (XALATAN) 0.005 % ophthalmic solution Place 1 drop into both eyes at bedtime.   Yes [provider]  levalbuterol (XOPENEX HFA) 45 MCG/ACT inhaler Inhale  2 puffs into the lungs every 4 (four) hours as needed for wheezing.   Yes [provider]  loratadine (CLARITIN) 10 MG tablet Take 10 mg by mouth daily.    Yes [provider]  Multiple Vitamin (MULTIVITAMIN WITH MINERALS) TABS tablet Take 1 tablet by mouth daily.   Yes [provider]  sodium chloride (OCEAN) 0.65 % SOLN nasal spray Place 1 spray into both nostrils daily as needed for congestion.   Yes [provider]  Tetrahydrozoline HCl (VISINE OP) Place 1 drop into both eyes 2 (two) times daily as needed (dry eyes).   Yes [provider]  traMADol (ULTRAM) 50 MG tablet Take 50 mg by mouth 2 (two) times daily as needed (pain).   Yes [provider]  gabapentin (NEURONTIN) 100 MG capsule Take 2 capsules (200 mg total) by mouth at bedtime. Patient not taking: Reported on 01/26/2017 07/31/16   Valentino Nose, MD    Family History Family History  Problem Relation Age of Onset  . Dementia Unknown   . Hypertension Daughter     Social History Social History  Substance Use Topics  . Smoking status: Former Smoker    Packs/day: 0.25    Years: 23.00    Quit date: 06/05/1973  . Smokeless tobacco: Never Used  . Alcohol use No     Allergies   Atacand hct [candesartan cilexetil-hctz]; Carbamazepine; Erythromycin; Glucophage [metformin hcl]; Lipitor [atorvastatin]; Nsaids; Penicillins; Sulfa antibiotics; Ceclor [cefaclor]; Clindamycin/lincomycin; Rosuvastatin calcium; Dilaudid [hydromorphone hcl]; and Plavix [clopidogrel]   Review of Systems Review of Systems  Constitutional: Negative for chills, diaphoresis, fatigue and fever.  HENT: Negative for congestion, rhinorrhea and sneezing.   Eyes: Negative.   Respiratory: Negative for cough, chest tightness and shortness of breath.   Cardiovascular: Negative for chest pain and leg swelling.  Gastrointestinal: Negative for abdominal pain, blood in stool, diarrhea, nausea and vomiting.    Genitourinary: Negative for difficulty urinating, flank pain, frequency and hematuria.  Musculoskeletal: Negative for arthralgias and back pain.  Skin: Negative for rash.  Neurological: Positive for speech difficulty and weakness. Negative for dizziness, numbness and headaches.     Physical Exam Updated Vital Signs BP (!) 151/72   Pulse 81   Temp 98.3 F (36.8 C)   Resp 12   SpO2 98%   Physical Exam  Constitutional: She is oriented to person, place, and time. She appears well-developed and well-nourished.  HENT:  Head: Normocephalic and atraumatic.  Eyes: Pupils are equal, round, and reactive to light.  Neck: Normal range of motion. Neck supple.  Cardiovascular: Normal rate, regular rhythm and normal heart sounds.   Pulmonary/Chest: Effort normal and breath sounds normal. No respiratory distress. She has no wheezes. She has no rales. She exhibits no tenderness.  Abdominal: Soft. Bowel sounds are normal. There is no tenderness. There is no rebound and no guarding.  Musculoskeletal: Normal range of motion.  She exhibits no edema.  Lymphadenopathy:    She has no cervical adenopathy.  Neurological: She is alert and oriented to person, place, and time.  Patient is status post right AKA. She has weakness in her right arm as compared to her left arm. She has normal sensation in all 4 extremities although she feels like she has a routinely feeling in her right stump. No significant facial drooping. She has normal sensation to light touch throughout her face. She has a little bit slowed finger to nose on the right as compared to left.  Skin: Skin is warm and dry. No rash noted.  Psychiatric: She has a normal mood and affect.     ED Treatments / Results  Labs (all labs ordered are listed, but only abnormal results are displayed) Labs Reviewed  APTT - Abnormal; Notable for the following:       Result Value   aPTT 40 (*)    All other components within normal limits  COMPREHENSIVE  METABOLIC PANEL - Abnormal; Notable for the following:    Glucose, Bld 219 (*)    Creatinine, Ser 1.23 (*)    Albumin 3.2 (*)    GFR calc non Af Amer 37 (*)    GFR calc Af Amer 42 (*)    All other components within normal limits  I-STAT CHEM 8, ED - Abnormal; Notable for the following:    Creatinine, Ser 1.20 (*)    Glucose, Bld 225 (*)    All other components within normal limits  PROTIME-INR  CBC  DIFFERENTIAL  I-STAT TROPONIN, ED  CBG MONITORING, ED    EKG  EKG Interpretation  Date/Time:  Friday January 26 2017 13:15:34 EDT Ventricular Rate:  85 PR Interval:  174 QRS Duration: 86 QT Interval:  386 QTC Calculation: 459 R Axis:   9 Text Interpretation:  Normal sinus rhythm Normal ECG since last tracing no significant change Confirmed by Rolan Bucco [phone]) on 01/26/2017 6:02:10 PM       Radiology Ct Head Wo Contrast  Result Date: 01/26/2017 CLINICAL DATA:  Left-sided weakness. EXAM: CT HEAD WITHOUT CONTRAST TECHNIQUE: Contiguous axial images were obtained from the base of the skull through the vertex without intravenous contrast. COMPARISON:  CT scan of July 27, 2016. FINDINGS: Brain: Mild diffuse cortical atrophy is noted. Mild chronic ischemic white matter disease is noted. No mass effect or midline shift is noted. Ventricular size is within normal limits. There is no evidence of mass lesion, hemorrhage or acute infarction. Vascular: No hyperdense vessel or unexpected calcification. Skull: Normal. Negative for fracture or focal lesion. Sinuses/Orbits: Mild bilateral ethmoid and maxillary sinusitis. Other: None. IMPRESSION: Mild diffuse cortical atrophy. Mild chronic ischemic white matter disease. No acute intracranial abnormality seen. Electronically Signed   By: Lupita Raider, M.D.   On: 01/26/2017 14:31    Procedures Procedures (including critical care time)  Medications Ordered in ED Medications - No data to display   Initial Impression / Assessment and Plan  / ED Course  I have reviewed the triage vital signs and the nursing notes.  Pertinent labs & imaging results that were available during my care of the patient were reviewed by me and considered in my medical decision making (see chart for details).     I consulted with neurology who will see the patient. I spoke with the resident with the internal medicine teaching service she will admit the patient for further evaluation.  Final Clinical Impressions(s) / ED Diagnoses  Final diagnoses:  Transient cerebral ischemia, unspecified type    New Prescriptions New Prescriptions   No medications on file     Rolan Bucco, MD 01/26/17 [phone]

## 2017-01-26 NOTE — H&P (Signed)
Date: 01/26/2017               Patient Name:  Carolyn Werner MRN: [id number]  DOB: [birth year] Age / Sex: 83 y.o., female   PCP: Carolyn Ito, MD         Medical Service: Internal Medicine Teaching Service         Attending Physician: Dr. Oswaldo Werner, Carolyn Werner, *    First Contact: Dr. Saunders Werner Pager: [phone]  Second Contact: Dr. Karma Werner Pager: [phone]       After Hours (After 5p/  First Contact Pager: [phone]  weekends / holidays): Second Contact Pager: [phone]   Chief Complaint: Right arm weakness and slurred speech  History of Present Illness: Carolyn Werner is a 83 yo F with a history of CVA (07/27/16); L ICA stenosis (80-99%): PVD s/p R AKA; HTN; HLD and DM (diet controlled) presenting with right arm weakness and slurred speech. She woke up in the morning with acute worsening of her right sided weakness and had difficulty getting out of bed. She was concerned she may be having another stroke. She went to her hearing aid appointment and then decided to come to the ED as her symptoms had persisted. She also experienced some slow/slurred speech. She states that since arriving to the ED, her right sided weakness and speech have improved but are not back to baseline. Of note, she was prescribed 40 pills of tramadol in february, but has only taken them sparingly and still has about half left. She has taken them a little more often I the last couple of weeks, but still just a few pills, last at 1 PM yesterday. Daughter states that she has not been drinking as much recently due to concern for falls with transferring when she goes to the bathroom. She endorses decreased energy, constipation, and chronic nasal discharge. She denies palpitaion, chest pain, dizziness, or lightheadedness.  In the ED, Patient is Hemodynamically stable; PT-INR WNL; PTT 40; CBC WNL; BMP showed glucose 225, Cr 1.23; and Troponin negative. EKG showed NSR @ 85 bpm, with non-specific intraventricular conduction delay. CT  Head showed no acute intracranial abnormality. Patient to be admitted for further work up and treatment.   Meds:  Current Meds  Medication Sig  . acetaminophen (TYLENOL) 500 MG tablet Take 500-1,000 mg by mouth every 6 (six) hours as needed (pain).  Marland Kitchen amLODipine (NORVASC) 2.5 MG tablet Take 2.5 mg by mouth daily with supper.   Marland Kitchen aspirin EC 81 MG EC tablet Take 1 tablet (81 mg total) by mouth daily.  . Biotin 5 MG TABS Take 5 mg by mouth daily with supper.   . budesonide (PULMICORT) 0.25 MG/2ML nebulizer solution Take 0.25 mg by nebulization See admin instructions. Inhale 1 vial (0.25 mg) via nebulization every other night  . Cholecalciferol (VITAMIN D) 2000 units CAPS Take 2,000 Units by mouth daily.  Marland Kitchen dipyridamole-aspirin (AGGRENOX) 200-25 MG 12hr capsule Take once a day at bedtime; after 08/11/16 please take twice a day (Patient taking differently: Take 1 capsule by mouth 2 (two) times daily. )  . docusate sodium (COLACE) 100 MG capsule Take 200 mg by mouth 2 (two) times daily.   . DULoxetine (CYMBALTA) 30 MG capsule Take 30 mg by mouth daily.  . Flaxseed, Linseed, (FLAXSEED OIL) 1000 MG CAPS Take 1,000 mg by mouth 3 (three) times daily.  . fluticasone (FLONASE) 50 MCG/ACT nasal spray Place 1 spray into both nostrils daily as needed for allergies or rhinitis.  Marland Kitchen  gabapentin (NEURONTIN) 100 MG capsule Take 1 capsule (100 mg total) by mouth every morning. (Patient taking differently: Take 100-200 mg by mouth See admin instructions. Take 1 capsule (100 mg) by mouth every morning and 2 capsules (200 mg) at night)  . latanoprost (XALATAN) 0.005 % ophthalmic solution Place 1 drop into both eyes at bedtime.  . levalbuterol (XOPENEX HFA) 45 MCG/ACT inhaler Inhale 2 puffs into the lungs every 4 (four) hours as needed for wheezing.  Marland Kitchen loratadine (CLARITIN) 10 MG tablet Take 10 mg by mouth daily.   . Multiple Vitamin (MULTIVITAMIN WITH MINERALS) TABS tablet Take 1 tablet by mouth daily.  . sodium chloride  (OCEAN) 0.65 % SOLN nasal spray Place 1 spray into both nostrils daily as needed for congestion.  . Tetrahydrozoline HCl (VISINE OP) Place 1 drop into both eyes 2 (two) times daily as needed (dry eyes).  . traMADol (ULTRAM) 50 MG tablet Take 50 mg by mouth 2 (two) times daily as needed (pain).     Allergies: Allergies as of 01/26/2017 - Review Complete 01/26/2017  Allergen Reaction Noted  . Atacand hct [candesartan cilexetil-hctz] Other (See Comments) 07/27/2016  . Carbamazepine Other (See Comments) 07/27/2016  . Erythromycin Diarrhea 07/27/2016  . Glucophage [metformin hcl] Other (See Comments) 07/27/2016  . Lipitor [atorvastatin] Other (See Comments) 07/27/2016  . Nsaids Nausea And Vomiting 07/27/2016  . Penicillins Shortness Of Breath and Rash 09/16/2014  . Sulfa antibiotics Anaphylaxis 07/03/2016  . Ceclor [cefaclor] Hives 09/16/2014  . Clindamycin/lincomycin Other (See Comments) 07/27/2016  . Rosuvastatin calcium Other (See Comments) 07/28/2016  . Dilaudid [hydromorphone hcl] Rash 09/16/2014  . Plavix [clopidogrel] Rash and Other (See Comments) 09/16/2014   Past Medical History:  Diagnosis Date  . Diabetes mellitus without complication ([name])   . Headache   . Hx of benign neoplasm of pituitary gland   . PVD (peripheral vascular disease) ([name])   . Shingles   . Stroke [facility])     Family History: - Mother: MI, Alzheimers - Father: Cancer   Social History:  - Fomer smoker, 10 pack year history  - Denies ETOH or illicit drug use - Lives with daughter  Review of Systems: A complete ROS was negative except as per HPI.  Physical Exam: Blood pressure (!) 159/61, pulse 78, temperature 98.3 F (36.8 C), resp. rate 16, SpO2 99 %. Physical Exam  Constitutional: She is oriented to person, place, and time. She appears well-developed and well-nourished.  HENT:  Head: Normocephalic and atraumatic.  Cardiovascular: Normal rate and regular rhythm.   Pulmonary/Chest: Effort normal  and breath sounds normal. No respiratory distress.  Abdominal: Soft. Bowel sounds are normal. She exhibits no distension. There is no tenderness.  Musculoskeletal: She exhibits no edema.  R AKA  Neurological: She is alert and oriented to person, place, and time. No cranial nerve deficit.  RUE and R grip strength 4/5   Skin: Skin is warm and dry.  Psychiatric: She has a normal mood and affect.     EKG: personally reviewed my interpretation is  NSR @ 85 bpm, with non-specific intraventricular conduction delay.  Assessment & Plan by Problem:  Mrs. Milan is a 83 yo F with a history of CVA (07/27/16); L ICA stenosis (80-99%): PVD s/p R AKA; HTN; HLD and DM (diet controlled) presenting with right arm weakness and slurred speech.  Acute on Chronic Neurologic Deficits: Presenting with 1 day presentation of worsening of residual deficits in the setting of tramadol use, decreased oral intake and severe stenosis.  L sided stroke in 2018, with right upper extremity weakness and slurred speech. L ICA stenosis 80-99%, patient refused intervention. Suspect decreased cerebral perfusion leading to unmasking of prior deficits. Do not suspect new infarct, but not ruled out at this time.  - Appreciate neurology recommendations - Continue ASA 81mg  Daily  - Continue Aggrenox 200-25 BID  - Neuro Checks q2h - Hold home Amlodipine - Orthostatic Vitals - PT/OT Eval - SLP Eval  Constipation: Chronic constipation, takes stool softeners at home, worse with tramadol use and decreased oral hydration. Last bowl movement 01/23/17. - Colace 200 BID - Polyethylene glycol 17g Dail PRN  History of Right AKA: Persistent phantom pains. - Cymbalta 30mg  Daily - Gabapentin 100mg  Daily - Gabapentin 200mg  qhs  HTN: BP normal in ED. - Hold home amlodipine 2.5mg  Daily  Asthma:  - Continue levalbuterol 2 puff q4h PRN wheezing  FEN: NPO until swallow study, If passed Heart Diet DVT Prophylaxis: Lovenox 40mg  Code  Status: Full  Dispo: Admit patient to Observation with expected length of stay less than 2 midnights.  Signed: Beola Cord, MD 01/26/2017, 9:57 PM  Pager: [phone]

## 2017-01-26 NOTE — ED Notes (Signed)
Attempted to call report to floor x 1 w/o success.  Will attempt again.

## 2017-01-27 ENCOUNTER — Observation Stay (HOSPITAL_COMMUNITY): Payer: Medicare Other

## 2017-01-27 ENCOUNTER — Encounter (HOSPITAL_COMMUNITY): Payer: Self-pay | Admitting: Neurology

## 2017-01-27 DIAGNOSIS — I6350 Cerebral infarction due to unspecified occlusion or stenosis of unspecified cerebral artery: Secondary | ICD-10-CM | POA: Diagnosis not present

## 2017-01-27 DIAGNOSIS — J4530 Mild persistent asthma, uncomplicated: Secondary | ICD-10-CM | POA: Diagnosis present

## 2017-01-27 DIAGNOSIS — Z89611 Acquired absence of right leg above knee: Secondary | ICD-10-CM | POA: Diagnosis not present

## 2017-01-27 DIAGNOSIS — E1142 Type 2 diabetes mellitus with diabetic polyneuropathy: Secondary | ICD-10-CM | POA: Diagnosis not present

## 2017-01-27 DIAGNOSIS — R4781 Slurred speech: Secondary | ICD-10-CM | POA: Diagnosis present

## 2017-01-27 DIAGNOSIS — J45909 Unspecified asthma, uncomplicated: Secondary | ICD-10-CM | POA: Diagnosis not present

## 2017-01-27 DIAGNOSIS — Z993 Dependence on wheelchair: Secondary | ICD-10-CM | POA: Diagnosis not present

## 2017-01-27 DIAGNOSIS — I69351 Hemiplegia and hemiparesis following cerebral infarction affecting right dominant side: Secondary | ICD-10-CM | POA: Diagnosis present

## 2017-01-27 DIAGNOSIS — E119 Type 2 diabetes mellitus without complications: Secondary | ICD-10-CM | POA: Diagnosis not present

## 2017-01-27 DIAGNOSIS — I10 Essential (primary) hypertension: Secondary | ICD-10-CM | POA: Diagnosis not present

## 2017-01-27 DIAGNOSIS — Z79899 Other long term (current) drug therapy: Secondary | ICD-10-CM | POA: Diagnosis not present

## 2017-01-27 DIAGNOSIS — Z8673 Personal history of transient ischemic attack (TIA), and cerebral infarction without residual deficits: Secondary | ICD-10-CM | POA: Diagnosis not present

## 2017-01-27 DIAGNOSIS — M5030 Other cervical disc degeneration, unspecified cervical region: Secondary | ICD-10-CM | POA: Diagnosis present

## 2017-01-27 DIAGNOSIS — K5909 Other constipation: Secondary | ICD-10-CM | POA: Diagnosis present

## 2017-01-27 DIAGNOSIS — I6330 Cerebral infarction due to thrombosis of unspecified cerebral artery: Secondary | ICD-10-CM | POA: Diagnosis not present

## 2017-01-27 DIAGNOSIS — G459 Transient cerebral ischemic attack, unspecified: Secondary | ICD-10-CM | POA: Diagnosis present

## 2017-01-27 DIAGNOSIS — I672 Cerebral atherosclerosis: Secondary | ICD-10-CM | POA: Diagnosis present

## 2017-01-27 DIAGNOSIS — R531 Weakness: Secondary | ICD-10-CM

## 2017-01-27 DIAGNOSIS — I129 Hypertensive chronic kidney disease with stage 1 through stage 4 chronic kidney disease, or unspecified chronic kidney disease: Secondary | ICD-10-CM | POA: Diagnosis present

## 2017-01-27 DIAGNOSIS — M545 Low back pain: Secondary | ICD-10-CM | POA: Diagnosis present

## 2017-01-27 DIAGNOSIS — G546 Phantom limb syndrome with pain: Secondary | ICD-10-CM | POA: Diagnosis present

## 2017-01-27 DIAGNOSIS — Z880 Allergy status to penicillin: Secondary | ICD-10-CM | POA: Diagnosis not present

## 2017-01-27 DIAGNOSIS — G8191 Hemiplegia, unspecified affecting right dominant side: Secondary | ICD-10-CM | POA: Diagnosis present

## 2017-01-27 DIAGNOSIS — E785 Hyperlipidemia, unspecified: Secondary | ICD-10-CM | POA: Diagnosis present

## 2017-01-27 DIAGNOSIS — K581 Irritable bowel syndrome with constipation: Secondary | ICD-10-CM | POA: Diagnosis not present

## 2017-01-27 DIAGNOSIS — I6310 Cerebral infarction due to embolism of unspecified precerebral artery: Secondary | ICD-10-CM

## 2017-01-27 DIAGNOSIS — Z882 Allergy status to sulfonamides status: Secondary | ICD-10-CM | POA: Diagnosis not present

## 2017-01-27 DIAGNOSIS — Z888 Allergy status to other drugs, medicaments and biological substances status: Secondary | ICD-10-CM | POA: Diagnosis not present

## 2017-01-27 DIAGNOSIS — Z885 Allergy status to narcotic agent status: Secondary | ICD-10-CM | POA: Diagnosis not present

## 2017-01-27 DIAGNOSIS — K580 Irritable bowel syndrome with diarrhea: Secondary | ICD-10-CM | POA: Diagnosis present

## 2017-01-27 DIAGNOSIS — Z7951 Long term (current) use of inhaled steroids: Secondary | ICD-10-CM | POA: Diagnosis not present

## 2017-01-27 DIAGNOSIS — I69393 Ataxia following cerebral infarction: Secondary | ICD-10-CM | POA: Diagnosis not present

## 2017-01-27 DIAGNOSIS — E1151 Type 2 diabetes mellitus with diabetic peripheral angiopathy without gangrene: Secondary | ICD-10-CM | POA: Diagnosis present

## 2017-01-27 DIAGNOSIS — N183 Chronic kidney disease, stage 3 (moderate): Secondary | ICD-10-CM | POA: Diagnosis not present

## 2017-01-27 DIAGNOSIS — Z7982 Long term (current) use of aspirin: Secondary | ICD-10-CM | POA: Diagnosis not present

## 2017-01-27 DIAGNOSIS — N182 Chronic kidney disease, stage 2 (mild): Secondary | ICD-10-CM | POA: Diagnosis present

## 2017-01-27 DIAGNOSIS — G8929 Other chronic pain: Secondary | ICD-10-CM | POA: Diagnosis present

## 2017-01-27 DIAGNOSIS — R29702 NIHSS score 2: Secondary | ICD-10-CM | POA: Diagnosis present

## 2017-01-27 DIAGNOSIS — R2689 Other abnormalities of gait and mobility: Secondary | ICD-10-CM | POA: Diagnosis present

## 2017-01-27 DIAGNOSIS — E1122 Type 2 diabetes mellitus with diabetic chronic kidney disease: Secondary | ICD-10-CM | POA: Diagnosis present

## 2017-01-27 DIAGNOSIS — Z87891 Personal history of nicotine dependence: Secondary | ICD-10-CM | POA: Diagnosis not present

## 2017-01-27 DIAGNOSIS — I5030 Unspecified diastolic (congestive) heart failure: Secondary | ICD-10-CM | POA: Diagnosis not present

## 2017-01-27 DIAGNOSIS — K589 Irritable bowel syndrome without diarrhea: Secondary | ICD-10-CM | POA: Diagnosis not present

## 2017-01-27 DIAGNOSIS — I6522 Occlusion and stenosis of left carotid artery: Secondary | ICD-10-CM | POA: Diagnosis present

## 2017-01-27 DIAGNOSIS — E739 Lactose intolerance, unspecified: Secondary | ICD-10-CM | POA: Diagnosis present

## 2017-01-27 DIAGNOSIS — R270 Ataxia, unspecified: Secondary | ICD-10-CM | POA: Diagnosis present

## 2017-01-27 LAB — BASIC METABOLIC PANEL
ANION GAP: 8 (ref 5–15)
BUN: 16 mg/dL (ref 6–20)
CALCIUM: 9.8 mg/dL (ref 8.9–10.3)
CO2: 24 mmol/L (ref 22–32)
Chloride: 105 mmol/L (ref 101–111)
Creatinine, Ser: 1.28 mg/dL — ABNORMAL HIGH (ref 0.44–1.00)
GFR calc Af Amer: 40 mL/min — ABNORMAL LOW (ref >60)
GFR, EST NON AFRICAN AMERICAN: 35 mL/min — AB (ref >60)
GLUCOSE: 122 mg/dL — AB (ref 65–99)
Potassium: 3.9 mmol/L (ref 3.5–5.1)
Sodium: 137 mmol/L (ref 135–145)

## 2017-01-27 LAB — GLUCOSE, CAPILLARY
GLUCOSE-CAPILLARY: 109 mg/dL — AB (ref 65–99)
Glucose-Capillary: 107 mg/dL — ABNORMAL HIGH (ref 65–99)
Glucose-Capillary: 126 mg/dL — ABNORMAL HIGH (ref 65–99)

## 2017-01-27 MED ORDER — ENOXAPARIN SODIUM 30 MG/0.3ML ~~LOC~~ SOLN
30.0000 mg | Freq: Every day | SUBCUTANEOUS | Status: DC
  Administered 2017-01-27 – 2017-01-29 (×3): 30 mg via SUBCUTANEOUS
  Filled 2017-01-27 (×3): qty 0.3

## 2017-01-27 MED ORDER — ASPIRIN 81 MG PO CHEW
81.0000 mg | CHEWABLE_TABLET | Freq: Every day | ORAL | Status: DC
  Administered 2017-01-27 – 2017-01-30 (×4): 81 mg via ORAL
  Filled 2017-01-27 (×4): qty 1

## 2017-01-27 MED ORDER — POLYETHYLENE GLYCOL 3350 17 G PO PACK
17.0000 g | PACK | Freq: Every day | ORAL | Status: DC
  Administered 2017-01-27 – 2017-01-29 (×3): 17 g via ORAL
  Filled 2017-01-27 (×4): qty 1

## 2017-01-27 MED ORDER — SODIUM CHLORIDE 0.9 % IV BOLUS (SEPSIS)
1000.0000 mL | Freq: Once | INTRAVENOUS | Status: AC
  Administered 2017-01-27: 1000 mL via INTRAVENOUS

## 2017-01-27 NOTE — Evaluation (Signed)
Physical Therapy Evaluation Patient Details Name: Carolyn Werner MRN: [id number] DOB: August 05, 1921 Today's Date: 01/27/2017   History of Present Illness  83 yo female with onset of RUE weakness and noted improving but residual increased RUE weakness esp grip.  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  Hx of pituitary tumor.  Clinical Impression  Pt is ready for a consideration with CIR as she is a previously independent BR transfer and in a day program with good independence of wc mobility.  Her daughter is present and informing the decision process by detailing help and PLOF.  Will focus on standing pivots and strength as PVD will allow with LLE, and progress until her rehab details are decided with case management and SW.    Follow Up Recommendations CIR    Equipment Recommendations  None recommended by PT    Recommendations for Other Services Rehab consult     Precautions / Restrictions Precautions Precautions: Fall (telemetry) Restrictions Weight Bearing Restrictions: No Other Position/Activity Restrictions: R AKA with phantom pain and L Knee and leg very sensitive from PVD      Mobility  Bed Mobility Overal bed mobility: Needs Assistance Bed Mobility: Supine to Sit     Supine to sit: Mod assist     General bed mobility comments: assist with trunk and to scoot to EOB with bed pad, pt is requiring time to control sitting balance  Transfers Overall transfer level: Needs assistance Equipment used: 1 person hand held assist Transfers: Sit to/from [employer] Transfers Sit to Stand: Mod assist Stand pivot transfers: Mod assist       General transfer comment: cues for hand placement and assistance to support pivot to chair  Ambulation/Gait             General Gait Details: not able to walk  Arts development officer mobility: Yes Wheelchair propulsion: Both upper extremities Wheelchair parts: Supervision/cueing Distance: space of her hospital room Wheelchair Assistance Details (indicate cue type and reason): reminders for safety and to avoid strking the LLE on furniture with some impulsive turns  Modified Rankin (Stroke Patients Only) Modified Rankin (Stroke Patients Only) Pre-Morbid Rankin Score: Moderate disability Modified Rankin: Moderately severe disability     Balance Overall balance assessment: Needs assistance Sitting-balance support: Bilateral upper extremity supported Sitting balance-Leahy Scale: Fair     Standing balance support: Bilateral upper extremity supported Standing balance-Leahy Scale: Poor                               Pertinent Vitals/Pain Pain Assessment: No/denies pain    Home Living Family/patient expects to be discharged to:: Private residence Living Arrangements: Children Available Help at Discharge: Personal care attendant;Available PRN/intermittently;Family Type of Home: Apartment Home Access: Ramped entrance     Home Layout: One level Home Equipment: Bedside commode;Tub bench;Grab bars - toilet;Grab bars - tub/shower;Wheelchair - power;Hospital bed;Other (comment)      Prior Function Level of Independence: Needs assistance   Gait / Transfers Assistance Needed: wc walker  ADL's / Homemaking Assistance Needed: daughter and attendant help wtih house and her care (toilets herself)        Hand Dominance   Dominant Hand: Right    Extremity/Trunk Assessment   Upper Extremity Assessment Upper  Extremity Assessment: Generalized weakness    Lower Extremity Assessment Lower Extremity Assessment: Generalized weakness    Cervical / Trunk Assessment Cervical / Trunk Assessment: Normal  Communication   Communication: No difficulties  Cognition Arousal/Alertness: Awake/alert Behavior During Therapy: WFL for tasks  assessed/performed Overall Cognitive Status: History of cognitive impairments - at baseline                                 General Comments: pt is a participant in adult day care program      General Comments      Exercises     Assessment/Plan    PT Assessment Patient needs continued PT services  PT Problem List Decreased strength;Decreased activity tolerance;Decreased range of motion;Decreased balance;Decreased mobility;Decreased coordination;Decreased cognition;Decreased knowledge of use of DME;Decreased safety awareness;Decreased knowledge of precautions;Pain;Decreased skin integrity       PT Treatment Interventions DME instruction;Functional mobility training;Therapeutic activities;Therapeutic exercise;Balance training;Neuromuscular re-education;Patient/family education    PT Goals (Current goals can be found in the Care Plan section)  Acute Rehab PT Goals Patient Stated Goal: to get home soon PT Goal Formulation: With patient/family Time For Goal Achievement: 02/10/17 Potential to Achieve Goals: Good    Frequency Min 2X/week   Barriers to discharge Other (comment) (requires 24/7 assist and help and dgtr not always there) has family assist but they work    Co-evaluation               AM-PAC PT "6 Clicks" Daily Activity  Outcome Measure Difficulty turning over in bed (including adjusting bedclothes, sheets and blankets)?: Unable Difficulty moving from lying on back to sitting on the side of the bed? : Unable Difficulty sitting down on and standing up from a chair with arms (e.g., wheelchair, bedside commode, etc,.)?: Unable Help needed moving to and from a bed to chair (including a wheelchair)?: A Lot Help needed walking in hospital room?: Total Help needed climbing 3-5 steps with a railing? : Total 6 Click Score: 7    End of Session Equipment Utilized During Treatment: Gait belt Activity Tolerance: Patient limited by pain;Patient limited by  fatigue Patient left: in chair;with call bell/phone within reach;with family/visitor present Nurse Communication: Mobility status PT Visit Diagnosis: Unsteadiness on feet (R26.81);Muscle weakness (generalized) (M62.81);Other abnormalities of gait and mobility (R26.89)    Time: 4431-5400 PT Time Calculation (min) (ACUTE ONLY): 38 min   Charges:   PT Evaluation $PT Eval Moderate Complexity: 1 Mod PT Treatments $Therapeutic Exercise: 8-22 mins $Therapeutic Activity: 8-22 mins   PT G Codes:   PT G-Codes **NOT FOR INPATIENT CLASS** Functional Assessment Tool Used: AM-PAC 6 Clicks Basic Mobility    Ivar Drape 01/27/2017, 5:49 PM   Samul Dada, PT MS Acute Rehab Dept. Number: [facility] [id number] and [facility] [phone]

## 2017-01-27 NOTE — Evaluation (Signed)
Limited Occupational Therapy Evaluation Patient Details Name: Carolyn Werner MRN: [id number] DOB: Dec 12, 1921 Today's Date: 01/27/2017    History of Present Illness 83 yo female with onset of RUE weakness and noted improving but residual increased RUE weakness esp grip.  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  Hx of pituitary tumor.   Clinical Impression   At this time, OT recommends Pt continue with skilled OT in the acute setting and will require CIR level therapy to maxmize safety and independence in ADL and functional transfers as well as address RUE deficits. Full note to follow at a later time. Thank you for your understanding.     Carolyn Werner 01/27/2017, 6:10 PM  Sherryl Manges OTR/L [phone]

## 2017-01-27 NOTE — Consult Note (Signed)
Requesting Physician: Dr. Oswaldo Done    Chief Complaint: RUE weakness and slurred speech  History obtained from: Patient and Chart   HPI:                                                                                                                                      Carolyn Werner is an 83 y.o. female with a past medical history significant for diabetes, hyperlipidemia, CKD, CVA, markedly severe left ICA stenosis (80-99%) who presents to [facility] with right upper extremity weakness and slurred speech. She states that she was normal when she went to bed last night and woke with these symptoms. Although her symptoms have improved since she arrived to the hospital, she does not feel that she is back to baseline.  Pertinent Imaging: CT head 01/26/17: Chronic small vessel disease; No acute abnormality MR brain 01/27/17: Acute infarction of the LEFT paramedian pons  MRA head/neck 01/27/17: No diagnostic visualization of the carotid bifurcations or either vertebral artery  Pertinent Medications: Aggrenox 200-25 ASA 81mg   Date last known well: Date: 01/25/2017 Time last known well: Unable to determine  tPA Given: No: Out of window  Modified Rankin Score: 4  Stroke Risk Factors - carotid stenosis, diabetes mellitus and hyperlipidemia   Past Medical History:  Diagnosis Date  . Diabetes mellitus without complication ([name])   . Headache   . Hx of benign neoplasm of pituitary gland   . PVD (peripheral vascular disease) ([name])   . Shingles   . Stroke [facility])     Past Surgical History:  Procedure Laterality Date  . ABOVE KNEE LEG AMPUTATION Right     Family History  Problem Relation Age of Onset  . Dementia Unknown   . Hypertension Daughter      reports that she quit smoking about 43 years ago. She has a 5.75 pack-year smoking history. She has never used smokeless tobacco. She reports that she does not drink alcohol or use drugs.  Allergies  Allergen Reactions  . Atacand Hct  [Candesartan Cilexetil-Hctz] Other (See Comments)    Kidney disease  . Carbamazepine Other (See Comments)    Changes in ability to read, think clearly, unsteady gait  . Erythromycin Diarrhea  . Glucophage [Metformin Hcl] Other (See Comments)    Chronic low blood sugar   . Lipitor [Atorvastatin] Other (See Comments)    Muscle weakness  . Nsaids Nausea And Vomiting    Motrin, Naprosyn, Voltaren, Celebrex, Prevacid, Mobic -- abdominal pain and burning, cramps, loose stool  . Penicillins Shortness Of Breath and Rash    Has patient had a PCN reaction causing immediate rash, facial/tongue/throat swelling, SOB or lightheadedness with hypotension: Yes Has patient had a PCN reaction causing severe rash involving mucus membranes or skin necrosis: No Has patient had a PCN reaction that required hospitalization: No Has patient had a PCN reaction occurring within the last 10 years: No If all of  the above answers are "NO", then may proceed with Cephalosporin use.  . Sulfa Antibiotics Anaphylaxis  . Ceclor [Cefaclor] Hives  . Clindamycin/Lincomycin Other (See Comments)    Abdominal pain, loss of appetite  . Rosuvastatin Calcium Other (See Comments)    Lethargy, malaise  . Dilaudid [Hydromorphone Hcl] Rash  . Plavix [Clopidogrel] Rash and Other (See Comments)    Skin peeled, hair fell out    Medications:                                                                                                                       Current Meds  Medication Sig  . acetaminophen (TYLENOL) 500 MG tablet Take 500-1,000 mg by mouth every 6 (six) hours as needed (pain).  Marland Kitchen amLODipine (NORVASC) 2.5 MG tablet Take 2.5 mg by mouth daily with supper.   Marland Kitchen aspirin EC 81 MG EC tablet Take 1 tablet (81 mg total) by mouth daily.  . Biotin 5 MG TABS Take 5 mg by mouth daily with supper.   . budesonide (PULMICORT) 0.25 MG/2ML nebulizer solution Take 0.25 mg by nebulization See admin instructions. Inhale 1 vial (0.25 mg) via  nebulization every other night  . Cholecalciferol (VITAMIN D) 2000 units CAPS Take 2,000 Units by mouth daily.  Marland Kitchen dipyridamole-aspirin (AGGRENOX) 200-25 MG 12hr capsule Take once a day at bedtime; after 08/11/16 please take twice a day (Patient taking differently: Take 1 capsule by mouth 2 (two) times daily. )  . docusate sodium (COLACE) 100 MG capsule Take 200 mg by mouth 2 (two) times daily.   . DULoxetine (CYMBALTA) 30 MG capsule Take 30 mg by mouth daily.  . Flaxseed, Linseed, (FLAXSEED OIL) 1000 MG CAPS Take 1,000 mg by mouth 3 (three) times daily.  . fluticasone (FLONASE) 50 MCG/ACT nasal spray Place 1 spray into both nostrils daily as needed for allergies or rhinitis.  Marland Kitchen gabapentin (NEURONTIN) 100 MG capsule Take 1 capsule (100 mg total) by mouth every morning. (Patient taking differently: Take 100-200 mg by mouth See admin instructions. Take 1 capsule (100 mg) by mouth every morning and 2 capsules (200 mg) at night)  . latanoprost (XALATAN) 0.005 % ophthalmic solution Place 1 drop into both eyes at bedtime.  . levalbuterol (XOPENEX HFA) 45 MCG/ACT inhaler Inhale 2 puffs into the lungs every 4 (four) hours as needed for wheezing.  Marland Kitchen loratadine (CLARITIN) 10 MG tablet Take 10 mg by mouth daily.   . Multiple Vitamin (MULTIVITAMIN WITH MINERALS) TABS tablet Take 1 tablet by mouth daily.  . sodium chloride (OCEAN) 0.65 % SOLN nasal spray Place 1 spray into both nostrils daily as needed for congestion.  . Tetrahydrozoline HCl (VISINE OP) Place 1 drop into both eyes 2 (two) times daily as needed (dry eyes).  . traMADol (ULTRAM) 50 MG tablet Take 50 mg by mouth 2 (two) times daily as needed (pain).    Review Of Systems:  History obtained from the patient  General: Negative for fever Psychological: Negative for any known behavioral disorder Ophthalmic: Negative for blurry or double  vision Respiratory: Negative for cough, shortness of breath Cardiovascular: Negative for chest pain Gastrointestinal: Negative for abdominal pain Musculoskeletal: Negative for joint swelling or muscular weakness Neurological: As noted in HPI Dermatological: Negative for rash or skin changes, numbness or tingling  Blood pressure (!) 103/54, pulse 77, temperature 98.6 F (37 C), temperature source Oral, resp. rate 20, height 5\' 4"  (1.626 m), weight 61.3 kg (135 lb 1.6 oz), SpO2 99 %.   Physical Examination:                                                                                                      General: WDWN female.  HEENT:  Normocephalic, no lesions, without obvious abnormality.  Normal external eye and conjunctiva.  Normal external ears. Normal external nose, mucus membranes and septum.  Normal pharynx. Cardiovascular: regular rate and rhythm, pulses palpable throughout   Pulmonary: Unlabored breathing Abdomen: Soft Extremities: no joint deformities, effusion, or inflammation. Right AKA. Skin: warm and dry, no hyperpigmentation, vitiligo, or suspicious lesions  Neurological Examination:                                                                                               Mental Status: Carolyn Werner is alert, oriented, thought content appropriate.  Speech mildly slurred without evidence of aphasia. Able to follow 3-step commands without difficulty. Cranial Nerves: II: Visual fields grossly normal, pupils are equal, round, reactive to light. III,IV, VI: Ptosis not present, extra-ocular muscle movements intact bilaterally V,VII: Smile and eyebrow raise is symmetric. Facial light touch sensation intact bilaterally VIII: Hearing grossly intact IX,X: Uvula and palate rise symmetrically XI: SCM and bilateral shoulder shrug strength 5/5 XII: Midline tongue extension Motor: Right :     Upper extremity   4/5   Left:     Upper extremity   5/5          Lower extremity    5/5    Lower extremity   5/5 Pronator drift present on the right Sensory: Pinprick and light touch intact throughout the LUE and BLE; dull on the right Deep Tendon Reflexes: 2+ and symmetric throughout Plantars: Right: downgoing   Left: downgoing Cerebellar: Finger-to-nose test with some ataxia on the right, I suspect this is secondary to weakness. Gait: Deferred  Lab Results: Basic Metabolic Panel:  Recent Labs Lab 01/26/17 1341 01/26/17 1355 01/27/17 0443  NA 135 137 137  K 3.9 3.9 3.9  CL 104 102 105  CO2 23  --  24  GLUCOSE 219* 225* 122*  BUN 14 17 16   CREATININE  1.23* 1.20* 1.28*  CALCIUM 10.1  --  9.8    Liver Function Tests:  Recent Labs Lab 01/26/17 1341  AST 24  ALT 15  ALKPHOS 68  BILITOT 0.5  PROT 6.8  ALBUMIN 3.2*   No results for input(s): LIPASE, AMYLASE in the last 168 hours. No results for input(s): AMMONIA in the last 168 hours.  CBC:  Recent Labs Lab 01/26/17 1341 01/26/17 1355  WBC 5.6  --   NEUTROABS 3.5  --   HGB 12.3 13.6  HCT 37.2 40.0  MCV 88.4  --   PLT 154  --     Cardiac Enzymes: No results for input(s): CKTOTAL, CKMB, CKMBINDEX, TROPONINI in the last 168 hours.  Lipid Panel:  Recent Labs Lab 01/26/17 2113  CHOL 206*  TRIG 217*  HDL 29*  CHOLHDL 7.1  VLDL 43*  LDLCALC 134*    CBG:  Recent Labs Lab 01/26/17 2207 01/27/17 0710  GLUCAP 114* 107*    Microbiology: No results found for this or any previous visit.  Coagulation Studies:  Recent Labs  01/26/17 1341  LABPROT 14.1  INR 1.08    Imaging: Ct Head Wo Contrast  Result Date: 01/26/2017 CLINICAL DATA:  Left-sided weakness. EXAM: CT HEAD WITHOUT CONTRAST TECHNIQUE: Contiguous axial images were obtained from the base of the skull through the vertex without intravenous contrast. COMPARISON:  CT scan of July 27, 2016. FINDINGS: Brain: Mild diffuse cortical atrophy is noted. Mild chronic ischemic white matter disease is noted. No mass effect or  midline shift is noted. Ventricular size is within normal limits. There is no evidence of mass lesion, hemorrhage or acute infarction. Vascular: No hyperdense vessel or unexpected calcification. Skull: Normal. Negative for fracture or focal lesion. Sinuses/Orbits: Mild bilateral ethmoid and maxillary sinusitis. Other: None. IMPRESSION: Mild diffuse cortical atrophy. Mild chronic ischemic white matter disease. No acute intracranial abnormality seen. Electronically Signed   By: Lupita Raider, M.D.   On: 01/26/2017 14:31     Assessment  Ms. Kruser is a young 83 year old woman with a past medical history that includes CVA who presented to [facility] with slurred speech and right arm weakness. On my visit, she had some persistent slurred speech, right arm weakness and decreased sensation to the right arm. It is possible that she had another stroke, or recrudesence of a previous infarct due to a metabolic abnormality. Will get MR brain and MRA head/neck, because of her previous severe left ICA stenosis, to evaluate. As Aggrenox contains aspirin, would suggest stopping aspirin 81mg  and continuing Aggrenox for dual antiplatelet therapy.  Recommendations:  -HgbA1c, fasting lipid panel -PT consult, OT consult, Speech consult -Echocardiogram -Risk factor modification -Telemetry monitoring -Frequent neuro checks -NPO until passes stroke swallow screen -We will follow  Leonel Ramsay PA-C [facility] [phone]  01/27/2017, 7:36 AM  Neuro hospitalist addendum Patient seen and examined independently. I have reviewed the note above and made the at its as necessary. The patient has a new pontine stroke, which is most likely secondary to her severely stenotic posterior circulation versus small vessel disease from her diabetes. I suggested trying aspirin and Plavix dual antiplatelet therapy but she remembers that Plavix gave her very severe skin rashes. She is currently on Aggrenox and aspirin  81 as prescribed by her stroke neurologist. Her most recent MRA done today shows poor visualization of her bilateral vertebral arteries and mid basilar, unclear if that is real change versus technique related artifact.  Recommendations: -Agree with the  recommendations above for the stroke workup. -I would favor obtaining a CTA of her head and neck or a diagnostic cerebral angiogram to better visualize the cerebral vasculature especially the posterior circulation, but the patient has been on the fence about this because of her deranged renal function. -I would leave the recommendations for the imaging modality of choice for the stroke team as they know this patient and she has followed with them.  -for now I would recommend continuing Aggrenox and aspirin, although the acute stroke might suggest that she has failed that combination treatment. Stroke team to address the antiplatelets in the morning as well as she does not want to make any changes to meds and wants to know options. I brought up trying Ticagrelor as a antiplatelet too, which needs to be further addressed in discussions with her.  Please page stroke NP  Or  PA  Or MD  from 8am-4 pm as this patient will be followed by the stroke team at this point.  You can look them up on [email or web address]  Milon Dikes, MD [facility] [phone]  If 7pm to 7am, please call on call as listed on AMION.

## 2017-01-27 NOTE — Progress Notes (Signed)
Inpatient Rehabilitation  Per PT request, patient was screened by [name] for appropriateness for an Inpatient Acute Rehab consult.  At this time we are recommending an Inpatient Rehab consult.  Text paged resident to notify; please order if in agreement.   Charlane Ferretti., CCC/SLP Admission Coordinator  [facility] Inpatient Rehabilitation  Cell [phone]

## 2017-01-27 NOTE — Evaluation (Addendum)
Speech Language Pathology Evaluation Patient Details Name: Carolyn Werner MRN: [id number] DOB: 09-24-1921 Today's Date: 01/27/2017 Time: 1914-7829 SLP Time Calculation (min) (ACUTE ONLY): 14 min  Problem List:  Patient Active Problem List   Diagnosis Date Noted  . Right arm weakness 01/26/2017  . Weakness 01/26/2017  . Hyperlipemia 10/05/2016  . Weakness of both arms   . Acute CVA (cerebrovascular accident) ([name]) 07/28/2016  . 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 Slurred speech 07/27/2016  . CKD (chronic kidney disease) stage 3, GFR 30-59 ml/min 07/27/2016  . Diabetes mellitus ([name]) 07/27/2016  . Hypertension 07/27/2016  . Depression 07/27/2016  . Glaucoma 07/27/2016  . Neuropathic pain 07/27/2016  . Asthma 07/27/2016  . Heart palpitations 07/05/2016  . Cardiac murmur 07/05/2016  . Phantom limb pain ([name]) 11/13/2014  . Cervical disc disorder with radiculopathy of cervical region 11/13/2014   Past Medical History:  Past Medical History:  Diagnosis Date  . Diabetes mellitus without complication ([name])   . Headache   . Hx of benign neoplasm of pituitary gland   . PVD (peripheral vascular disease) ([name])   . Shingles   . Stroke [facility])    Past Surgical History:  Past Surgical History:  Procedure Laterality Date  . ABOVE KNEE LEG AMPUTATION Right    HPI:  Carolyn Washingtonis an 83 y.o.femalewith a past medical history significant for diabetes, hyperlipidemia, CKD, CVA, markedly severe left ICA stenosis (80-99%) who presents to [facility] with right upper extremity weakness and slurred speech. She states that she was normal when she went to bed last night and woke with these symptoms. Although her symptoms have improved since she arrived to the hospital, she does not feel that she is back to baseline. Head CT 8/24 with no acute findings, MRI pending.   Assessment / Plan / Recommendation Clinical Impression  Patient presents with mild  dysarthria and slowed speech, with mildly reduced intelligibility (95% intelligibility for this trained listener). On MMSE, pt scored 24/30 which is [facility], though she does have difficulty with delayed recall (1/3), sequencing/mental manipulation (3/5). These are consistent with her report of baseline deficits in short term memory within last 1-2 years. Notably, pt was surprised by her difficulty with sentence formulation, stating she "couldn't get the words down," and that it is "worse than before." Pt also endorses voice change, stating her voice is "more gravely." Voice does appear hoarse, low vocal intensity noted. Recommend skilled ST for training in intelligibility strategies for dysarthria; will follow acutely. Follow-up recommendations TBD pending OT/PT evaluations.    SLP Assessment  SLP Recommendation/Assessment: Patient needs continued Speech Lanaguage Pathology Services SLP Visit Diagnosis: Dysarthria and anarthria (R47.1)    Follow Up Recommendations  Other (comment) (TBA)    Frequency and Duration min 1 x/week  2 weeks      SLP Evaluation Cognition  Overall Cognitive Status: History of cognitive impairments - at baseline Arousal/Alertness: Awake/alert Orientation Level: Oriented X4 Attention: Sustained Sustained Attention: Appears intact Memory: Impaired Memory Impairment: Decreased short term memory;Decreased recall of new information Decreased Short Term Memory: Verbal basic;Functional basic Awareness: Appears intact Safety/Judgment: Appears intact       Comprehension  Auditory Comprehension Overall Auditory Comprehension: Appears within functional limits for tasks assessed Visual Recognition/Discrimination Discrimination: Not tested Reading Comprehension Reading Status:  (reads and follows short sentence level commands)    Expression Expression Primary Mode of Expression: Verbal Verbal Expression Overall Verbal Expression: Appears within functional limits for tasks  assessed Repetition: No  impairment Naming: No impairment Pragmatics: No impairment Written Expression Dominant Hand: Right Written Expression: Exceptions to [facility] Self Formulation Ability: Phrase   Oral / Motor  Oral Motor/Sensory Function Overall Oral Motor/Sensory Function: Within functional limits Motor Speech Overall Motor Speech: Impaired Respiration: Within functional limits Phonation: Hoarse;Low vocal intensity Resonance: Within functional limits Articulation: Impaired Level of Impairment: Sentence Intelligibility: Intelligibility reduced Word: 75-100% accurate Phrase: 75-100% accurate Sentence: 75-100% accurate Conversation: 75-100% accurate Motor Planning: Witnin functional limits Motor Speech Errors: Aware Effective Techniques: Over-articulate;Increased vocal intensity   GO          Functional Assessment Tool Used: skilled clinical judgment Functional Limitations: Motor speech Motor Speech Current Status [phone]): At least 1 percent but less than 20 percent impaired, limited or restricted Motor Speech Goal Status [phone]): At least 1 percent but less than 20 percent impaired, limited or restricted        Rondel Baton, MS, CCC-SLP Speech-Language Pathologist [phone]  Arlana Lindau 01/27/2017, 9:26 AM

## 2017-01-27 NOTE — Progress Notes (Signed)
Subjective:  Carolyn Werner was seen sitting comfortably in bed this morning in no acute distress. She was using both of her upper extremities to make herself her morning coffee, which she stated she was looking forward to drinking as she hasn't been able to drink or eat as much as she wanted to over the past 24 hours. She states that her upper extremity strength has improved and that her speech has improved, however she is not quite back to baseline.  Objective:  Vital signs in last 24 hours: Vitals:   01/27/17 0030 01/27/17 0230 01/27/17 0430 01/27/17 0630  BP: (!) 145/67 139/77 (!) 103/54 138/68  Pulse: 71 76 77 74  Resp: 18 19 20 18   Temp: 98.5 F (36.9 C) 98.6 F (37 C) 98.6 F (37 C) 98.6 F (37 C)  TempSrc: Oral Oral Oral Oral  SpO2:   96% 97%  Weight:      Height:       Physical Exam  Constitutional:  Patient seen comfortably sitting in bed in no acute distress. Patient is very talkative and pleasant with interviewers  Cardiovascular: Normal rate, regular rhythm, normal heart sounds and intact distal pulses.  Exam reveals no friction rub.   No murmur heard. Pulmonary/Chest: Effort normal. No respiratory distress. She has no wheezes.  Abdominal: Soft. Bowel sounds are normal. She exhibits no distension. There is no tenderness.  Musculoskeletal: She exhibits no edema (of lower extremity) or tenderness (of lower extremity).  Neurological:  Patient oriented to self, day of week, and setting. EOM intact. Face strength and sensation intact bilaterally. Tongue midline. Speech not slurred, with normal rate. 4+/5 RUE bicep/tricep strength, 5/5 LUE bicep/tricep. 4+/5 grip and finger strength on RUE, 5/5 grip and finger strength on left. Gross motor and sensation of LLE intact.   Skin: Skin is warm and dry. No rash noted. No erythema.   Assessment/Plan:  Active Problems:   Right arm weakness   Weakness  Carolyn Werner is a 83 yo woman with a PMH of CVA, left ICA  stenosis (80-99% stenosis on 07/2016), PVD with R AKA, HTN, HLD, and DM who presented with acute onset right arm weakness and slurred speech. She was admitted to the internal medicine teaching service with neurology consulting. The specific problems addressed during admission are as follows:  Left paramedian pons stroke with RUE weakness, slurred speech: The patient's acute onset of right sided weakness and slurred speech, along with history of stroke and known atherosclerotic disease of the left ICA, was concerning for a new stroke. Her head CT on admission was negative for acute hemorrhage and neurology was consulted. Brain MRI and MRA of head and neck showed a new left paramedian pons stroke with flow limiting stenosis of ICA on left (consistent with known Left atherosclerotic disease). Given new onset stroke, will perform new stroke workup as outlined below. The patient will require counseling regarding her risk factors, as she was reportedly intolerant of statins in the past and declined surgical intervention for atherosclerotic disease in the past.  -Hgb A1c = 6.9% -Lipid panel showed HDL = 29, LDL = 134 -Echocardiogram ordered -Continue home Aggrenox -Telemetry while inpatient -PT/OT consult, SLP assessment recommending 1 session/week for 2 weeks  Hx of PVD with R AKA: Patient reports persistent phantom pains in right lower extremity. Will provide home cymbalta 30 mg and gapapentin 300 mg daily.   Hx of HTN: Holding patient's home BP medications for permissive hypertension in the setting of new onset stroke.  Chronic constipation: Colace, 200 mg BID and miralax, daily PRN. Will continue to monitor while inpatient.  Diet: Heart healthy  VTE prophylaxis: Lovenox  Dispo: Anticipated discharge in approximately 1-2 day(s) pending stroke workup.   Carolyn Lesches, MD 01/27/2017, 12:36 PM Pager: [phone]

## 2017-01-28 DIAGNOSIS — I6330 Cerebral infarction due to thrombosis of unspecified cerebral artery: Secondary | ICD-10-CM | POA: Insufficient documentation

## 2017-01-28 LAB — GLUCOSE, CAPILLARY
Glucose-Capillary: 120 mg/dL — ABNORMAL HIGH (ref 65–99)
Glucose-Capillary: 156 mg/dL — ABNORMAL HIGH (ref 65–99)
Glucose-Capillary: 109 mg/dL — ABNORMAL HIGH (ref 65–99)

## 2017-01-28 MED ORDER — TICAGRELOR 90 MG PO TABS
90.0000 mg | ORAL_TABLET | Freq: Two times a day (BID) | ORAL | Status: DC
  Administered 2017-01-28 – 2017-01-30 (×4): 90 mg via ORAL
  Filled 2017-01-28 (×5): qty 1

## 2017-01-28 NOTE — Progress Notes (Signed)
Neurology note indicates that they would like to pursue the CT angio head and neck due to concerns for progressive occlusive posterior circulation disease with non-diagnostic MRA. I had discussion with patient and daughter today and both were still weighing the decision if they would like to pursue this testing given her advanced aged and renal disease. Daughter indicated to me that she would like to continue discussion with the patient her the rest of her family before getting any further testing. Discussed with daughter again this afternoon via phone after reading neurology's note. She reports not having any further discussions with neurology since speaking with Dr. Wilford Corner yesterday. Would like to hold off on the CT angio for now. Will try to discuss with radiology and get a better sense of the risks of contrast in this particular patient given her age and renal function. Daughter reports she will be present tomorrow morning for further discussion. She was agreeable to stopping the aggrenox and trying brillinta. Will start this tonight.

## 2017-01-28 NOTE — Progress Notes (Signed)
Occupational Therapy Treatment Patient Details Name: Carolyn Werner MRN: [id number] DOB: 10-May-1922 Today's Date: 01/28/2017    History of present illness 83 yo female with onset of RUE weakness and noted improving but residual increased RUE weakness esp grip.  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  Hx of pituitary tumor.   OT comments  Pt progressing towards OT goals this session. Focused on sitting balance and establishing HEP for RUE using theraputty (super soft tan) and Theraputty Exercise Handout (see education section), which was completed in full with the exception of the last exercise because we did not have the material. Pt continues to demonstrate motivation to return to PLOF and has good support and encouragement from daughter. RUE continues to demonstrate deficits in strength and coordination and Pt requires skilled OT in the acute setting and CIR level therapy.  Follow Up Recommendations  CIR;Supervision/Assistance - 24 hour    Equipment Recommendations  Other (comment) (defer to next venue of care)    Recommendations for Other Services Rehab consult    Precautions / Restrictions Precautions Precautions: Fall Restrictions Weight Bearing Restrictions: No       Mobility Bed Mobility Overal bed mobility: Needs Assistance Bed Mobility: Supine to Sit;Sit to Supine     Supine to sit: Mod assist Sit to supine: Mod assist   General bed mobility comments: assist with trunk and to scoot to EOB with bed pad, pt requires time to control sitting balance, but is able to sit EOB with supervision for HEP  Transfers                 General transfer comment: deferred today to focus on sitting balance and establish HEP    Balance Overall balance assessment: Needs assistance Sitting-balance support: Single extremity supported;Feet supported Sitting balance-Leahy Scale:  Fair Sitting balance - Comments: sitting EOB for approx 15 min for HEP                                   ADL either performed or assessed with clinical judgement   ADL                                         General ADL Comments: Focus of session was establishing HEP for RUE - no ADL addressed this session     Vision       Perception     Praxis      Cognition Arousal/Alertness: Awake/alert Behavior During Therapy: WFL for tasks assessed/performed Overall Cognitive Status: History of cognitive impairments - at baseline                                 General Comments: pt is a participant in adult day care program        Exercises Exercises: Other exercises Other Exercises Other Exercises: Theraputty Exercise Handout Provided (see education) and Theraputty (super soft tan) Other Exercises: Pt practiced writing letters, name, sentence with red built up handle   Shoulder Instructions       General Comments Pt's daughter present for session today and is hopeful for CIR     Pertinent Vitals/ Pain  Pain Assessment: No/denies pain  Home Living                                          Prior Functioning/Environment              Frequency  Min 3X/week        Progress Toward Goals  OT Goals(current goals can now be found in the care plan section)  Progress towards OT goals: Progressing toward goals  Acute Rehab OT Goals Patient Stated Goal: to get back to PLOF OT Goal Formulation: With patient/family Time For Goal Achievement: 02/10/17 Potential to Achieve Goals: Good ADL Goals Pt Will Perform Grooming: sitting;with supervision Pt Will Perform Upper Body Bathing: with supervision;sitting;with adaptive equipment Pt Will Perform Lower Body Bathing: with supervision;with adaptive equipment;sitting/lateral leans Pt Will Transfer to Toilet: with min guard assist;stand pivot transfer (from WC  to [facility]; caregiver independent in assisting) Pt Will Perform Toileting - Clothing Manipulation and hygiene: with min guard assist;with caregiver independent in assisting;sit to/from stand Pt/caregiver will Perform Home Exercise Program: Right Upper extremity;With theraputty;With written HEP provided Additional ADL Goal #1: Pt will perform bed mobility at supervision level prior to participation in ADL activity  Plan Discharge plan remains appropriate;Frequency remains appropriate    Co-evaluation                 AM-PAC PT "6 Clicks" Daily Activity     Outcome Measure   Help from another person eating meals?: A Little Help from another person taking care of personal grooming?: A Little Help from another person toileting, which includes using toliet, bedpan, or urinal?: A Lot Help from another person bathing (including washing, rinsing, drying)?: A Lot Help from another person to put on and taking off regular upper body clothing?: A Little Help from another person to put on and taking off regular lower body clothing?: A Lot 6 Click Score: 15    End of Session    OT Visit Diagnosis: Unsteadiness on feet (R26.81);Muscle weakness (generalized) (M62.81);Hemiplegia and hemiparesis Hemiplegia - Right/Left: Right Hemiplegia - dominant/non-dominant: Dominant Hemiplegia - caused by: Cerebral infarction   Activity Tolerance Patient tolerated treatment well   Patient Left in bed;with call bell/phone within reach;with bed alarm set;with family/visitor present   Nurse Communication Mobility status        Time: 2549-8264 OT Time Calculation (min): 24 min  Charges: OT General Charges $OT Visit: 1 Procedure OT Treatments $Self Care/Home Management : 8-22 mins $Therapeutic Exercise: 8-22 mins  Sherryl Manges OTR/L [phone]   Carolyn Werner 01/28/2017, 2:36 PM

## 2017-01-28 NOTE — Progress Notes (Signed)
FULL OT Evaluation: Follow Up to previous entry  Clinical Impression: PTA Pt was independent in transfers to her wheelchair, toileting independently and requiring min A for bathing/dressing (mainly for safety). Pt is currently mod A due to increased weakness on the right side, mainly affecting her RUE which is her dominant hand. The pt was pleasant and excited to work with therapy, demonstrates the ability to tolerate intense therapy and is very motivated to work towards [employer]. Pt will require continued skilled occupational therapy in the acute setting and CIR level therapy to maximize safety and independence in ADL and functional transfers as well as functionality of RUE. Daughter is very supportive and would also benefit from the skilled education in caregiving. Next session to establish RUE HEP and functional transfers.   01/27/17 1808  OT Visit Information  Last OT Received On 01/27/17  Assistance Needed +1  History of Present Illness 83 yo female with onset of RUE weakness and noted improving but residual increased RUE weakness esp grip.  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  Hx of pituitary tumor.  Precautions  Precautions Fall  Restrictions  Weight Bearing Restrictions No  Other Position/Activity Restrictions R AKA with phantom pain and L Knee and leg very sensitive from PVD  Home Living  Family/patient expects to be discharged to: Private residence  Living Arrangements Children  Available Help at Discharge Personal care attendant;Available PRN/intermittently;Family  Type of Home Apartment  Home Access Ramped entrance  Home Layout One level  Bathroom Shower/Tub Tub/shower unit;Curtain  Horticulturist, commercial Yes (has limited help due to small BR area, wc and pt only)  How Accessible Accessible via wheelchair (has limited help due to small BR area, wc and pt  only)  Home Equipment BSC;Tub bench;Grab bars - toilet;Grab bars - tub/shower;Wheelchair - power;Hospital bed;Other (comment)  Lives With Daughter  Prior Function  Level of Independence Needs assistance  Gait / Transfers Assistance Needed wc walker; performing transfers independently  ADL's / Homemaking Assistance Needed independent in toileting, supervision/min A for bathing; assisted by attendant and daughter  Communication  Communication No difficulties  Pain Assessment  Pain Assessment No/denies pain  Cognition  Arousal/Alertness Awake/alert  Behavior During Therapy WFL for tasks assessed/performed  Overall Cognitive Status History of cognitive impairments - at baseline  General Comments pt is a participant in adult day care program  Upper Extremity Assessment  Upper Extremity Assessment RUE deficits/detail  RUE Deficits / Details decreased grip strength, noted ataxic movement in RUE when reaching for objects grossly 3+/5  RUE Sensation [facility])  RUE Coordination decreased fine motor;decreased gross motor  Lower Extremity Assessment  Lower Extremity Assessment Defer to PT evaluation  Cervical / Trunk Assessment  Cervical / Trunk Assessment Normal (rounded shoulders forward head)  ADL  Overall ADL's  Needs assistance/impaired  Eating/Feeding Set up;With adaptive utensils;Sitting  Eating/Feeding Details (indicate cue type and reason) able to eat dinner with food cut up for her and red built up handles on utensils - Pt using RUE to self- feed and encouraged to continue  Grooming Minimal assistance;Sitting  Upper Body Bathing Moderate assistance;Sitting  Lower Body Bathing Moderate assistance;With adaptive equipment;Sitting/lateral leans  Upper Body Dressing  Moderate assistance;Sitting  Lower Body Dressing Maximal assistance;Sit to/from Scientist, research (life sciences) Moderate assistance;Stand-pivot;BSC  Toilet Transfer Details (indicate cue type and reason) face to face no DME for  transfer  to the left  Toileting- Clothing Manipulation and Hygiene Moderate assistance;Sit to/from stand  Tub/ Shower Transfer Moderate assistance;Stand-pivot;Tub bench  Functional mobility during ADLs Wheelchair  General ADL Comments Pt with increased need for support from therapist to complete ADL due to RUE weakness  Vision- History  Patient Visual Report No change from baseline  Bed Mobility  Overal bed mobility Needs Assistance  Bed Mobility Supine to Sit  Supine to sit Mod assist  General bed mobility comments assist with trunk and to scoot to EOB with bed pad, pt is requiring time to control sitting balance  Transfers  Overall transfer level Needs assistance  Equipment used 1 person hand held assist  Transfers Sit to/from Stand;Stand Pivot Transfers  Sit to Stand Mod assist  Stand pivot transfers Mod assist  General transfer comment cues for hand placement and assistance to support pivot to chair  Balance  Overall balance assessment Needs assistance  Sitting-balance support Bilateral upper extremity supported  Sitting balance-Leahy Scale Fair  Sitting balance - Comments sitting EOB with no back support  Standing balance support Bilateral upper extremity supported  Standing balance-Leahy Scale Poor  Standing balance comment dependent on external support from therapist  OT - End of Session  Equipment Utilized During Treatment Gait belt;Other (comment) (Wheelchair)  Activity Tolerance Patient tolerated treatment well  Patient left with call bell/phone within reach;Other (comment) (in personal wheelchair)  Nurse Communication Mobility status;Other (comment) (transfer to left, no chair alarm)  OT Assessment  OT Recommendation/Assessment Patient needs continued OT Services  OT Visit Diagnosis Unsteadiness on feet (R26.81);Muscle weakness (generalized) (M62.81);Hemiplegia and hemiparesis  Hemiplegia - Right/Left Right  Hemiplegia - dominant/non-dominant Dominant  Hemiplegia - caused by  Cerebral infarction  OT Problem List Decreased strength;Decreased range of motion;Decreased activity tolerance;Impaired balance (sitting and/or standing);Decreased coordination;Decreased knowledge of precautions;Impaired UE functional use  OT Plan  OT Frequency (ACUTE ONLY) Min 3X/week  OT Treatment/Interventions (ACUTE ONLY) Self-care/ADL training;Therapeutic exercise;Neuromuscular education;Energy conservation;DME and/or AE instruction;Therapeutic activities;Patient/family education;Balance training  AM-PAC OT "6 Clicks" Daily Activity Outcome Measure  Help from another person eating meals? 3  Help from another person taking care of personal grooming? 3  Help from another person toileting, which includes using toliet, bedpan, or urinal? 2  Help from another person bathing (including washing, rinsing, drying)? 2  Help from another person to put on and taking off regular upper body clothing? 3  Help from another person to put on and taking off regular lower body clothing? 2  6 Click Score 15  ADL G Code Conversion CK  OT Recommendation  Recommendations for Other Services Rehab consult  Follow Up Recommendations CIR;Supervision/Assistance - 24 hour  OT Equipment Other (comment) (defer to next venue)  Individuals Consulted  Consulted and Agree with Results and Recommendations Patient  Acute Rehab OT Goals  Patient Stated Goal to get back to PLOF  OT Goal Formulation With patient  Time For Goal Achievement 02/10/17  Potential to Achieve Goals Good  OT Time Calculation  OT Start Time (ACUTE ONLY) 1715  OT Stop Time (ACUTE ONLY) 1741  OT Time Calculation (min) 26 min  OT General Charges  $OT Visit 1 Procedure  OT Evaluation  $OT Eval Moderate Complexity 1 Procedure  OT Treatments  $Self Care/Home Management  8-22 mins  Written Expression  Dominant Hand  Right   Sherryl Manges OTR/L [phone]

## 2017-01-28 NOTE — Progress Notes (Signed)
STROKE TEAM PROGRESS NOTE   HISTORY OF PRESENT ILLNESS (per record) Carolyn Werner is an 83 y.o. female with a past medical history significant for diabetes, hyperlipidemia, CKD, CVA, markedly severe left ICA stenosis (80-99%) who presents to [facility] with right upper extremity weakness and slurred speech. She states that she was normal when she went to bed last night and woke with these symptoms. Although her symptoms have improved since she arrived to the hospital, she does not feel that she is back to baseline. Pertinent Imaging: CT head 01/26/17: Chronic small vessel disease; No acute abnormality MR brain 01/27/17: Acute infarction of the LEFT paramedian pons  MRA head/neck 01/27/17: No diagnostic visualization of the carotid bifurcations or either vertebral artery Pertinent Medications: Aggrenox 200-25 ASA 81mg   Date last known well: Date: 01/25/2017 Time last known well: Unable to determine tPA Given: No: Out of window Modified Rankin Score: 4   SUBJECTIVE (INTERVAL HISTORY) Her  Daughter is at the bedside.  Marland KitchenShe was admitted with bilateral Pontine and cerebellar infarcts in Feb 2018 from LVA and PCA occlusion with known severe left ICA stenosis and did not f/u for elective LICA surgery or stent. Now she has new recurrent brainstem infarcts with likely progression of occlusive posterior circulation disease. Daughter does not want aggressive evaluation with catheter angio or stenting given her advanced age and poor functional baseline   OBJECTIVE Temp:  [97.7 F (36.5 C)-98.8 F (37.1 C)] 97.8 F (36.6 C) (08/26 1410) Pulse Rate:  [75-89] 84 (08/26 1410) Cardiac Rhythm: Normal sinus rhythm (08/26 0802) Resp:  [18-20] 20 (08/26 1410) BP: (131-159)/(70-99) 131/78 (08/26 1410) SpO2:  [98 %-100 %] 99 % (08/26 1410)  CBC:   Recent Labs Lab 01/26/17 1341 01/26/17 1355  WBC 5.6  --   NEUTROABS 3.5  --   HGB 12.3 13.6  HCT 37.2 40.0  MCV 88.4  --   PLT 154  --     Basic  Metabolic Panel:   Recent Labs Lab 01/26/17 1341 01/26/17 1355 01/27/17 0443  NA 135 137 137  K 3.9 3.9 3.9  CL 104 102 105  CO2 23  --  24  GLUCOSE 219* 225* 122*  BUN 14 17 16   CREATININE 1.23* 1.20* 1.28*  CALCIUM 10.1  --  9.8    Lipid Panel:     Component Value Date/Time   CHOL 206 (H) 01/26/2017 2113   TRIG 217 (H) 01/26/2017 2113   HDL 29 (L) 01/26/2017 2113   CHOLHDL 7.1 01/26/2017 2113   VLDL 43 (H) 01/26/2017 2113   LDLCALC 134 (H) 01/26/2017 2113   HgbA1c:  Lab Results  Component Value Date   HGBA1C 6.9 (H) 01/26/2017   Urine Drug Screen:     Component Value Date/Time   LABOPIA NONE DETECTED 07/27/2016 1210   COCAINSCRNUR NONE DETECTED 07/27/2016 1210   LABBENZ NONE DETECTED 07/27/2016 1210   AMPHETMU NONE DETECTED 07/27/2016 1210   THCU NONE DETECTED 07/27/2016 1210   LABBARB NONE DETECTED 07/27/2016 1210    Alcohol Level     Component Value Date/Time   [facility] <5 07/27/2016 1116    IMAGING   Mr Maxine Glenn Head Wo Contrast Mr Maxine Glenn Neck Wo Contrast 01/27/2017 Acute infarction of the LEFT paramedian pons representing an new area of ischemia compared with previous study from 07/27/2016.  Atrophy and small vessel disease with multiple old infarcts.  There is no appreciable flow related enhancement in either distal vertebral artery or the proximal to mid basilar artery. Concern raised  for acute posterior circulation thrombosis.  Intracranial atherosclerotic disease elsewhere as described, with  significant flow-limiting stenosis of the supraclinoid ICA on the LEFT.  Essentially nondiagnostic MRA of the neck.  Limited evaluation. Gross patency of both common carotid arteries is established, but there is no diagnostic visualization of the carotid bifurcations or either vertebral artery.    CT Head Without Contrast  01/26/2017 Mild diffuse cortical atrophy. Mild chronic ischemic white matter disease. No acute intracranial abnormality seen.   Transthoracic  Echocardiogram - pending       PHYSICAL EXAM  Vitals:   01/27/17 2137 01/28/17 0146 01/28/17 0544 01/28/17 1410  BP: (!) 158/99 (!) 145/71 (!) 142/72 131/78  Pulse: 89 88 88 84  Resp: 18 18 18 20   Temp: 97.7 F (36.5 C) 97.7 F (36.5 C) 98.8 F (37.1 C) 97.8 F (36.6 C)  TempSrc: Oral Oral Oral Oral  SpO2: 100% 98% 98% 99%  Weight:      Height:      pleasant elderly african american lady not in distress.  . Afebrile. Head is nontraumatic. Neck is supple without bruit.    Cardiac exam no murmur or gallop. Lungs are clear to auscultation. Distal pulses are well felt. Neurological Exam ;  To her to awake alert oriented x2 only. Follows only midline and one step commands. Diminished attention, registration and recall. Poor insight into her condition. Mild dysarthria. No aphasia. Right gaze preference can look to the left past midline. Pupils irregular but reactive. Fundi not visualized. Mild right lower facial weakness. Tongue midline. Mild right hemiparesis 4/5 strength. Weakness of right grip and intrinsic hand muscles. Deep tendon reflexes are symmetric. Plantars downgoing. Gait not tested. ASSESSMENT/PLAN Carolyn Werner is a 83 y.o. female with history of previous strokes, hyperlipidemia, chronic kidney disease, severe left ICA stenosis, peripheral vascular disease, headaches, diabetes mellitus, and history of benign neoplasm of the pituitary gland presenting with right upper extremity weakness and slurred speech.  She did not receive IV t-PA due to late presentation.  Stroke: LEFT paramedian pons infarct secondary to small vessel disease.  Resultant  Right hemiplegia  CT head - no acute abnormality seen.  MRI head - acute infarction of the LEFT paramedian pons  MRA head - no appreciable flow related enhancement in either distal vertebral artery or the proximal to mid basilar artery. Concern raised for acute posterior circulation thrombosis.  MRA Neck - essentially  nondiagnostic  Carotid Doppler - MRA neck  2D Echo - pending  LDL - 134  HgbA1c - 6.9  VTE prophylaxis - Lovenox Diet Heart Room service appropriate? Yes; Fluid consistency: Thin  aspirin 81 mg daily and dipyridamole SR 250 mg/aspirin 25 mg twice a day prior to admission, now on aspirin 81 mg daily and dipyridamole SR 250 mg/aspirin 25 mg twice a day  Patient counseled to be compliant with her antithrombotic medications  Ongoing aggressive stroke risk factor management  Therapy recommendations:  CIR recommended  Disposition: Pending  Hypertension  Stable  Permissive hypertension (OK if < 220/120) but gradually normalize in 5-7 days  Long-term BP goal normotensive  Hyperlipidemia  Home meds:  No lipid lowering medications prior to admission  LDL 134, goal < 70  Statin allergy  Diabetes  HgbA1c 6.9, goal < 7.0  Controlled  Other Stroke Risk Factors  Advanced age  Former cigarette smoker - quit 43 years ago  Hx stroke/TIA   Other Active Problems  Plavix allergy (rash)  CKD - creatinine 1.28  Statin  allergy  Severe left ICA stenosis  Hospital day # 1  I have personally examined this patient, reviewed notes, independently viewed imaging studies, participated in medical decision making and plan of care.ROS completed by me personally and pertinent positives fully documented  I have made any additions or clarifications directly to the above note. She presented with right hemiplegia due to recurrrent pontine infarct likely from progressive occlusive posterior circulation disease as previously known from her last admission and w/u in Feb 2018. Given her advanced age family does not want to pursue catheter angiogram and revascularization options at present time. She is allergic to plavix hence continue aspirin but add brillinta instead which has been used for endovascular treatment cases as she has failed aspirin and aggrenox.Check CT angiogram brain and neck as  MRA was nondiagnostic. D/w daughter and patient and answered questions.Greater than 50% time during this 35 minute visit wa spent on counselling and coordination of care about her recurrent Pontine strokes, occlusive intracranial atherosclerotic disease , discussion about treatment options and answering questions. Delia Heady, MD Medical Director [facility] Pager: [phone] 01/28/2017 4:53 PM   To contact Stroke Continuity provider, please refer to [email or web address]. After hours, contact General Neurology

## 2017-01-28 NOTE — Progress Notes (Signed)
   Subjective:  Carolyn Werner is doing well this morning. She reports that her speech no longer feels slowed. She does note persistent feeling of weakness in her right arm. Also notes that her fingers still feel clumsy and she has a difficult time with fine motor movements in her right hand. States she is having difficulty using a pen since admission.   Objective:  Vital signs in last 24 hours: Vitals:   01/27/17 1730 01/27/17 2137 01/28/17 0146 01/28/17 0544  BP: (!) 159/70 (!) 158/99 (!) 145/71 (!) 142/72  Pulse: 75 89 88 88  Resp: 18 18 18 18   Temp: 97.8 F (36.6 C) 97.7 F (36.5 C) 97.7 F (36.5 C) 98.8 F (37.1 C)  TempSrc: Oral Oral Oral Oral  SpO2: 99% 100% 98% 98%  Weight:      Height:       Constitutional: Elderly woman, sitting up in bed in no acute distress Cardiovascular: RRR. No m/r/g.   Pulmonary/Chest: CTAB Abdominal: soft, NT, BS+  Musculoskeletal: She exhibits no edema (of lower extremity) or tenderness (of lower extremity).  Neurological:  Patient oriented to self, day of week, and setting. EOM intact. Face strength and sensation intact bilaterally. Tongue midline. Speech not slurred, with normal rate. 4+/5 RUE bicep/tricep strength, 5/5 LUE bicep/tricep. 4/5 grip and finger strength on RUE, 5/5 grip and finger strength on left. Gross motor and sensation of LLE intact.  Skin: Skin is warm and dry. No rash noted. No erythema.   Assessment/Plan:  Left paramedian pons stroke with RUE weakness, slurred speech:  Continuing her aggrenox and ASA for now, will defer to stroke team for further recommendations. CVA work up has mostly been completed, just waiting on ECHO. OF note, patient has known atherosclerotic disease of the left ICA from her prior CVA (80-99& stenosis). Her new left paramedian pons stroke is most likely secondary to this known ICA disease. Have discussed with patient about possible further evaluation with CTA head and ned to better visualize her  vasculature. She is still on the fence. Will discuss further with radiology and better assess her risk given her underlying kidney disease (GFR ~40 but likely over-estimated given her age). She wishes to discuss further with her daughter who she reports will be here this afternoon. Continue to work on risk factor modifications, unfortunately she is statin intolerant. PT/OT recommending CIR, consult ordered.  -Echocardiogram ordered -Continue home Aggrenox -Telemetry while inpatient -PT/OT consult, SLP assessment recommending 1 session/week for 2 weeks  Hx of PVD with R AKA: Patient reports persistent phantom pains in right lower extremity. Will provide home cymbalta 30 mg and gapapentin 300 mg daily.   Hx of HTN: Holding patient's home BP medications for permissive hypertension in the setting of new onset stroke.  Dispo: Anticipated discharge in approximately 1-3 day(s).   Valentino Nose, MD 01/28/2017, 12:45 PM Pager: [phone]

## 2017-01-29 ENCOUNTER — Inpatient Hospital Stay (HOSPITAL_COMMUNITY): Payer: Medicare Other

## 2017-01-29 ENCOUNTER — Encounter (HOSPITAL_COMMUNITY): Payer: Self-pay | Admitting: *Deleted

## 2017-01-29 DIAGNOSIS — I69393 Ataxia following cerebral infarction: Secondary | ICD-10-CM

## 2017-01-29 DIAGNOSIS — I5030 Unspecified diastolic (congestive) heart failure: Secondary | ICD-10-CM

## 2017-01-29 DIAGNOSIS — Z89611 Acquired absence of right leg above knee: Secondary | ICD-10-CM

## 2017-01-29 DIAGNOSIS — I129 Hypertensive chronic kidney disease with stage 1 through stage 4 chronic kidney disease, or unspecified chronic kidney disease: Secondary | ICD-10-CM

## 2017-01-29 DIAGNOSIS — Z8673 Personal history of transient ischemic attack (TIA), and cerebral infarction without residual deficits: Secondary | ICD-10-CM

## 2017-01-29 DIAGNOSIS — I10 Essential (primary) hypertension: Secondary | ICD-10-CM

## 2017-01-29 DIAGNOSIS — I6330 Cerebral infarction due to thrombosis of unspecified cerebral artery: Principal | ICD-10-CM

## 2017-01-29 DIAGNOSIS — N183 Chronic kidney disease, stage 3 unspecified: Secondary | ICD-10-CM

## 2017-01-29 DIAGNOSIS — E785 Hyperlipidemia, unspecified: Secondary | ICD-10-CM

## 2017-01-29 DIAGNOSIS — E119 Type 2 diabetes mellitus without complications: Secondary | ICD-10-CM

## 2017-01-29 DIAGNOSIS — E1151 Type 2 diabetes mellitus with diabetic peripheral angiopathy without gangrene: Secondary | ICD-10-CM

## 2017-01-29 DIAGNOSIS — G459 Transient cerebral ischemic attack, unspecified: Secondary | ICD-10-CM

## 2017-01-29 LAB — ECHOCARDIOGRAM COMPLETE
Height: 64 in
WEIGHTICAEL: 2161.6 [oz_av]

## 2017-01-29 LAB — GLUCOSE, CAPILLARY: GLUCOSE-CAPILLARY: 122 mg/dL — AB (ref 65–99)

## 2017-01-29 MED ORDER — BUDESONIDE 0.25 MG/2ML IN SUSP
0.2500 mg | RESPIRATORY_TRACT | Status: DC
  Administered 2017-01-29: 0.25 mg via RESPIRATORY_TRACT
  Filled 2017-01-29: qty 2

## 2017-01-29 NOTE — Progress Notes (Signed)
Subjective:  The patient was seen sitting comfortably in her wheelchair this morning in no acute distress. She states that her hand strength and speech have continued to improve during hospitalization, but she has not yet returned to baseline. She states she feels better overall and is interested in working in the hospital with therapist to gain back her strength. Her daughter, Carolyn Werner [phone]) was present during rounds this morning. When asked about the CTA considered yesterday, the patient and her daughter were not interested in further imaging 2/2 patient's renal disease unless there could be another intervention besides surgery offered. The patient states that she tolerated the her new medications well yesterday and didn't have any complaints.   Objective: Vital signs in last 24 hours: Vitals:   01/28/17 1816 01/28/17 2153 01/29/17 0155 01/29/17 0615  BP: 122/62 (!) 147/76 (!) 161/69 (!) 145/84  Pulse: 85 79 81 89  Resp: 20 20 20 20   Temp: (!) 97.4 F (36.3 C) 98.7 F (37.1 C) 98.7 F (37.1 C) 97.9 F (36.6 C)  TempSrc: Oral Oral Oral Oral  SpO2: 100% 98% 96% 100%  Weight:      Height:       Physical Exam  Constitutional:  Elderly female sitting comfortably in wheelchair in no acute distress.  Cardiovascular: Normal rate, regular rhythm and intact distal pulses.  Exam reveals no friction rub.   No murmur heard. Pulmonary/Chest: Effort normal. No respiratory distress. She has no wheezes.  Abdominal: Soft. She exhibits no distension. There is no tenderness.  Musculoskeletal:  Patient s/p R AKA. No deformity, swelling, or tenderness of LLE.   Neurological:  PERRL. EOM intact. Face strength and sensation intact bilaterally. Tongue midline. 4+/5 R bicep and tricep strength on right, interval improvement from previous exam, with 5/5 bicep and tricep strength on left. 4+/5 R grip strength, with interval improvement from previous exams. 5/5 L grip strength.   Skin: Skin is warm  and dry. No rash noted. No erythema.   Assessment/Plan:  Active Problems:   Right arm weakness   Weakness   Cerebral thrombosis with cerebral infarction  Carolyn Werner is a 83 yo woman with a PMH of CVA, left ICA stenosis (80-99% stenosis on 07/2016), PVD with R AKA, HTN, HLD, and DM who presented with acute onset right arm weakness and slurred speech. She was admitted to the internal medicine teaching service with neurology consulting. The specific problems addressed during admission are as follows:  Left paramedian pons stroke with RUE weakness, slurred speech: The patient's acute onset of right sided weakness and slurred speech, along with history of stroke and known atherosclerotic disease of the left ICA, was concerning for a new stroke. Brain MRI showed a new left paramedian pons stroke with flow limiting stenosis of ICA on left (consistent with known L ICA disease). Neurology was recommending CTA for further evaluation of L ICA stenosis and possible surgical intervention. Per discussion with daughter this AM, it does not sound like they are interested in surgery or further studies. Patient is interested in lifestyle modifications/risk reduction strategies, including new HLD medications (intolerant of statins) and antiplatelet therapy. The patient's daughter reports being seen by physicians in the past for HLD and most recently had appointment with Dr. Allyson Sabal to discuss Repatha. The patient's family is still open to starting these medications as an outpatient and would like to follow up with them.  -Hgb A1c = 6.9% -Lipid panel showed HDL = 29, LDL = 134 -Echocardiogram complete but not read -  Continue ASA 81 mg and Brillinta 90 mg BID -Telemetry and neuro checks -PT/OT recommending CIR, appreciate their recommendations and assistance with placement.  Hx of PVD with R AKA: Will provide home cymbalta 30 mg and gapapentin 300 mg daily for persistent phantom pains in R lower extremity.    Hx of HTN: Holding patient's home BP medication for permissive hypertension. Plan to restart home amlodipine 2.5mg  daily on discharge but can add back sooner if BP elevated.   Chronic constipation: Colace, 200 mg BID and miralax, daily PRN.   Diet: Heart healthy  VTE prophylaxis: Lovenox  Dispo: Anticipated discharge in approximately 1-2 day(s) pending CIR/SNF placement.   Rozann Lesches, MD 01/29/2017, 9:50 AM Pager: [phone]

## 2017-01-29 NOTE — Care Management Note (Signed)
Case Management Note  Patient Details  Name: Carolyn Werner MRN: [id number] Date of Birth: June 22, 1921  Subjective/Objective:  Pt admitted with CVA. She is from home with her daughter.                   Action/Plan: PT/OT recommending CIR. CM following for d/c disposition.   Expected Discharge Date:                  Expected Discharge Plan:  IP Rehab Facility  In-House Referral:     Discharge planning Services  CM Consult  Post Acute Care Choice:    Choice offered to:     DME Arranged:    DME Agency:     HH Arranged:    HH Agency:     Status of Service:  In process, will continue to follow  If discussed at Long Length of Stay Meetings, dates discussed:    Additional Comments:  Kermit Balo, RN 01/29/2017, 12:50 PM

## 2017-01-29 NOTE — Consult Note (Signed)
Physical Medicine and Rehabilitation Consult Reason for Consult: Right side weakness Referring Physician: [facility]   HPI: Carolyn Werner is a 83 y.o. right handed female with history of diabetes mellitus, hyperlipidemia, CKD with creatinine 1.23, right AKA 5 years ago, CVA approximately 6 months ago related to left ICA high-grade stenosis and maintained on aspirin. Per chart review, patient, and family, patient lives with daughter as well as a personal care attendant a couple days a week and also attends adult daycare. Needed daughter and attendant to help with house and simple ADLs. One level home with a ramped entrance. Patient does not use a prosthesis primarily wheelchair bound. Daughter works during the day. Presented 01/26/2017 with increasing right-sided weakness. CT/MRI reviewed, showing left pons infarct. Per report, acute infarction of the left paramedian pons representing a new area of ischemia compared with previous study from 07/27/2016. Atrophy and small vessel disease with multiple old infarctions. MRA showed no appreciable flow related enhancement in either distal vertebral artery with a proximal to mid basilar artery. Intracranial atherosclerotic disease with significant flow limiting stenosis of the supraclinoid ICA on the left. Patient did not receive TPA. Echocardiogram is pending. Neurology consulted presently on aspirin in the edition of Brilinta. Subcutaneous Lovenox for DVT prophylaxis. Physical occupational therapy evaluations completed with recommendations of physical medicine rehabilitation consult.   Review of Systems  Constitutional: Negative for chills and fever.  HENT: Positive for hearing loss.   Eyes: Negative for blurred vision and double vision.  Respiratory: Negative for cough and shortness of breath.   Cardiovascular: Negative for chest pain and palpitations.  Gastrointestinal: Positive for constipation. Negative for nausea.  Genitourinary: Negative for  dysuria, flank pain and hematuria.  Musculoskeletal: Positive for joint pain and myalgias.  Skin: Negative for rash.  Neurological: Positive for weakness. Negative for seizures.  All other systems reviewed and are negative.  Past Medical History:  Diagnosis Date  . Diabetes mellitus without complication ([name])   . Headache   . Hx of benign neoplasm of pituitary gland   . PVD (peripheral vascular disease) ([name])   . Shingles   . Stroke [facility])    Past Surgical History:  Procedure Laterality Date  . ABOVE KNEE LEG AMPUTATION Right    Family History  Problem Relation Age of Onset  . Dementia Unknown   . Hypertension Daughter    Social History:  reports that she quit smoking about 43 years ago. She has a 5.75 pack-year smoking history. She has never used smokeless tobacco. She reports that she does not drink alcohol or use drugs. Allergies:  Allergies  Allergen Reactions  . Atacand Hct [Candesartan Cilexetil-Hctz] Other (See Comments)    Kidney disease  . Carbamazepine Other (See Comments)    Changes in ability to read, think clearly, unsteady gait  . Erythromycin Diarrhea  . Glucophage [Metformin Hcl] Other (See Comments)    Chronic low blood sugar   . Lactose Intolerance (Gi) Diarrhea and Nausea And Vomiting    Nausea & Vomiting; Diarrhea  . Lipitor [Atorvastatin] Other (See Comments)    Muscle weakness  . Nsaids Nausea And Vomiting    Motrin, Naprosyn, Voltaren, Celebrex, Prevacid, Mobic -- abdominal pain and burning, cramps, loose stool  . Penicillins Shortness Of Breath and Rash    Has patient had a PCN reaction causing immediate rash, facial/tongue/throat swelling, SOB or lightheadedness with hypotension: Yes Has patient had a PCN reaction causing severe rash involving mucus membranes or skin necrosis: No Has patient  had a PCN reaction that required hospitalization: No Has patient had a PCN reaction occurring within the last 10 years: No If all of the above answers are  "NO", then may proceed with Cephalosporin use.  . Sulfa Antibiotics Anaphylaxis  . Ceclor [Cefaclor] Hives  . Clindamycin/Lincomycin Other (See Comments)    Abdominal pain, loss of appetite  . Rosuvastatin Calcium Other (See Comments)    Lethargy, malaise  . Dilaudid [Hydromorphone Hcl] Rash  . Plavix [Clopidogrel] Rash and Other (See Comments)    Skin peeled, hair fell out   Medications Prior to Admission  Medication Sig Dispense Refill  . acetaminophen (TYLENOL) 500 MG tablet Take 500-1,000 mg by mouth every 6 (six) hours as needed (pain).    Marland Kitchen amLODipine (NORVASC) 2.5 MG tablet Take 2.5 mg by mouth daily with supper.     Marland Kitchen aspirin EC 81 MG EC tablet Take 1 tablet (81 mg total) by mouth daily. 90 tablet 2  . Biotin 5 MG TABS Take 5 mg by mouth daily with supper.     . budesonide (PULMICORT) 0.25 MG/2ML nebulizer solution Take 0.25 mg by nebulization See admin instructions. Inhale 1 vial (0.25 mg) via nebulization every other night    . Cholecalciferol (VITAMIN D) 2000 units CAPS Take 2,000 Units by mouth daily.    Marland Kitchen dipyridamole-aspirin (AGGRENOX) 200-25 MG 12hr capsule Take once a day at bedtime; after 08/11/16 please take twice a day (Patient taking differently: Take 1 capsule by mouth 2 (two) times daily. ) 90 capsule 2  . docusate sodium (COLACE) 100 MG capsule Take 200 mg by mouth 2 (two) times daily.     . DULoxetine (CYMBALTA) 30 MG capsule Take 30 mg by mouth daily.    . Flaxseed, Linseed, (FLAXSEED OIL) 1000 MG CAPS Take 1,000 mg by mouth 3 (three) times daily.    . fluticasone (FLONASE) 50 MCG/ACT nasal spray Place 1 spray into both nostrils daily as needed for allergies or rhinitis.    Marland Kitchen gabapentin (NEURONTIN) 100 MG capsule Take 1 capsule (100 mg total) by mouth every morning. (Patient taking differently: Take 100-200 mg by mouth See admin instructions. Take 1 capsule (100 mg) by mouth every morning and 2 capsules (200 mg) at night) 30 capsule 2  . latanoprost (XALATAN) 0.005 %  ophthalmic solution Place 1 drop into both eyes at bedtime.    . levalbuterol (XOPENEX HFA) 45 MCG/ACT inhaler Inhale 2 puffs into the lungs every 4 (four) hours as needed for wheezing.    Marland Kitchen loratadine (CLARITIN) 10 MG tablet Take 10 mg by mouth daily.     . Multiple Vitamin (MULTIVITAMIN WITH MINERALS) TABS tablet Take 1 tablet by mouth daily.    . sodium chloride (OCEAN) 0.65 % SOLN nasal spray Place 1 spray into both nostrils daily as needed for congestion.    . Tetrahydrozoline HCl (VISINE OP) Place 1 drop into both eyes 2 (two) times daily as needed (dry eyes).    . traMADol (ULTRAM) 50 MG tablet Take 50 mg by mouth 2 (two) times daily as needed (pain).    Marland Kitchen gabapentin (NEURONTIN) 100 MG capsule Take 2 capsules (200 mg total) by mouth at bedtime. (Patient not taking: Reported on 01/26/2017) 90 capsule 2    Home: Home Living Family/patient expects to be discharged to:: Private residence Living Arrangements: Children Available Help at Discharge: Personal care attendant, Available PRN/intermittently, Family Type of Home: Apartment Home Access: Ramped entrance Home Layout: One level Bathroom Shower/Tub: Tub/shower unit, Curtain  Bathroom Toilet: Standard Bathroom Accessibility: Yes (has limited help due to small BR area, wc and pt only) Home Equipment: Bedside commode, Tub bench, Grab bars - toilet, Grab bars - tub/shower, Wheelchair - power, Hospital bed, Other (comment)  Lives With: Daughter  Functional History: Prior Function Level of Independence: Needs assistance Gait / Transfers Assistance Needed: wc walker; performing transfers independently ADL's / Homemaking Assistance Needed: independent in toileting, supervision/min A for bathing; assisted by attendant and daughter Functional Status:  Mobility: Bed Mobility Overal bed mobility: Needs Assistance Bed Mobility: Supine to Sit, Sit to Supine Supine to sit: Mod assist Sit to supine: Mod assist General bed mobility comments:  assist with trunk and to scoot to EOB with bed pad, pt requires time to control sitting balance, but is able to sit EOB with supervision for HEP Transfers Overall transfer level: Needs assistance Equipment used: 1 person hand held assist Transfers: Sit to/from Stand, Stand Pivot Transfers Sit to Stand: Mod assist Stand pivot transfers: Mod assist General transfer comment: deferred today to focus on sitting balance and establish HEP Ambulation/Gait General Gait Details: not able to walk Wheelchair Mobility Wheelchair mobility: Yes Wheelchair propulsion: Both upper extremities Wheelchair parts: Supervision/cueing Distance: space of her hospital room Wheelchair Assistance Details (indicate cue type and reason): reminders for safety and to avoid strking the LLE on furniture with some impulsive turns  ADL: ADL Overall ADL's : Needs assistance/impaired Eating/Feeding: Set up, With adaptive utensils, Sitting Eating/Feeding Details (indicate cue type and reason): able to eat dinner with food cut up for her and red built up handles on utensils - Pt using RUE to self- feed and encouraged to continue Grooming: Minimal assistance, Sitting Upper Body Bathing: Moderate assistance, Sitting Lower Body Bathing: Moderate assistance, With adaptive equipment, Sitting/lateral leans Upper Body Dressing : Moderate assistance, Sitting Lower Body Dressing: Maximal assistance, Sit to/from stand Toilet Transfer: Moderate assistance, Stand-pivot, BSC Toilet Transfer Details (indicate cue type and reason): face to face no DME for transfer to the left Toileting- Clothing Manipulation and Hygiene: Moderate assistance, Sit to/from stand Tub/ Shower Transfer: Moderate assistance, Stand-pivot, Tub bench Functional mobility during ADLs: Wheelchair General ADL Comments: Focus of session was establishing HEP for RUE - no ADL addressed this session  Cognition: Cognition Overall Cognitive Status: History of cognitive  impairments - at baseline Arousal/Alertness: Awake/alert Orientation Level: Oriented X4 Attention: Sustained Sustained Attention: Appears intact Memory: Impaired Memory Impairment: Decreased short term memory, Decreased recall of new information Decreased Short Term Memory: Verbal basic, Functional basic Awareness: Appears intact Safety/Judgment: Appears intact Cognition Arousal/Alertness: Awake/alert Behavior During Therapy: WFL for tasks assessed/performed Overall Cognitive Status: History of cognitive impairments - at baseline General Comments: pt is a participant in adult day care program  Blood pressure (!) 161/69, pulse 81, temperature 98.7 F (37.1 C), temperature source Oral, resp. rate 20, height 5\' 4"  (1.626 m), weight 61.3 kg (135 lb 1.6 oz), SpO2 96 %. Physical Exam  Vitals reviewed. Constitutional: She is oriented to person, place, and time. She appears well-developed and well-nourished.  HENT:  Head: Normocephalic and atraumatic.  Eyes: EOM are normal. Right eye exhibits no discharge. Left eye exhibits no discharge.  Neck: Normal range of motion. Neck supple. No thyromegaly present.  Cardiovascular: Normal rate, regular rhythm and normal heart sounds.   Respiratory: Effort normal and breath sounds normal. No respiratory distress.  GI: Soft. Bowel sounds are normal. She exhibits no distension.  Musculoskeletal: She exhibits no edema or tenderness.  +Right AKA  Neurological: She  is alert and oriented to person, place, and time.  Patient is hard of hearing and follow simple commands. Motor: LUE/LLE: 5/5 proximal to distal RUE: 4+/5 proximal to distal with ataxia RLE: 5/5 proximal to distal Sensation intact to light touch DTRs symmetric  Skin: Skin is warm and dry.  Right lower extremity amputation site is well-healed  Psychiatric: She has a normal mood and affect. Her behavior is normal. Thought content normal.    Results for orders placed or performed during the  hospital encounter of 01/26/17 (from the past 24 hour(s))  Glucose, capillary     Status: Abnormal   Collection Time: 01/28/17  7:15 AM  Result Value Ref Range   Glucose-Capillary 120 (H) 65 - 99 mg/dL  Glucose, capillary     Status: Abnormal   Collection Time: 01/28/17 11:27 AM  Result Value Ref Range   Glucose-Capillary 156 (H) 65 - 99 mg/dL  Glucose, capillary     Status: Abnormal   Collection Time: 01/28/17  4:19 PM  Result Value Ref Range   Glucose-Capillary 109 (H) 65 - 99 mg/dL   Mr Shirlee Latch Wo Contrast  Result Date: 01/27/2017 CLINICAL DATA:  History of CVA and LEFT ICA stenosis presenting with RIGHT arm weakness and slurred speech. EXAM: MRI HEAD WITHOUT CONTRAST MRA HEAD WITHOUT CONTRAST MRA NECK WITHOUT CONTRAST TECHNIQUE: Multiplanar, multiecho pulse sequences of the brain and surrounding structures were obtained without intravenous contrast. Angiographic images of the Circle of Willis were obtained using MRA technique without intravenous contrast. Angiographic images of the neck were obtained using MRA technique without intravenous contrast. Carotid stenosis measurements (when applicable) are obtained utilizing NASCET criteria, using the distal internal carotid diameter as the denominator. COMPARISON:  CT head 01/26/2017.  MR head 07/27/2016. FINDINGS: MRI HEAD FINDINGS Brain: Restricted diffusion affects the LEFT paramedian pons in its midportion consistent with acute infarction. This measures approximately 1 cm in its longest dimension. There is no visible acute hemorrhage. Premature for age atrophy. Chronic microvascular ischemic change. Chronic LEFT occipital infarct. Chronic BILATERAL cerebellar infarcts. Chronic pontine infarcts. Chronic hemorrhage along the LEFT pons, more lateral to the area of acute infarction. Vascular: Reported separately. Skull and upper cervical spine: Normal marrow signal. Sinuses/Orbits: Chronic sinus disease. Some layering fluid LEFT maxillary region.  BILATERAL cataract extraction. Other: None. MRA HEAD FINDINGS Flow reducing stenosis, 75% or greater, is C8 the junction of the RIGHT petrous and RIGHT cavernous ICA. Mild non stenotic irregularity in the cavernous and supraclinoid RIGHT ICA segments. Minor non stenotic irregularity of the skull base ICA on the LEFT. Severe stenosis of the supraclinoid ICA on the LEFT, with short segment of no flow related enhancement implying 90% or greater stenosis. LEFT ICA terminus patent. Moderately disease RIGHT M1 MCA extending to the MCA bifurcation. Mild irregularity LEFT M1 MCA. Mild moderate disease at both anterior cerebrals, worse on the RIGHT. No MCA or ACA branch occlusion. Nonvisualization of the RIGHT and LEFT vertebral arteries. Nonvisualization of the proximal and mid segments of the basilar artery. Fetal origin RIGHT PCA. Patent LEFT posterior communicating artery appears to supply the LEFT P1 PCA, possible short-segment reflux down the distal vessel. On the previous MRA from 07/27/2016, the basilar artery was diseased but patent. Patent. MRA NECK FINDINGS Limited evaluation on this noncontrast exam. Gross patency BILATERAL common carotid arteries is is established. I am unable to assess for significant stenosis at the carotid bifurcations. There is no visible flow related enhancement of the vertebral arteries in the neck. IMPRESSION:  Acute infarction of the LEFT paramedian pons representing an new area of ischemia compared with previous study from 07/27/2016. Atrophy and small vessel disease with multiple old infarcts. There is no appreciable flow related enhancement in either distal vertebral artery or the proximal to mid basilar artery. Concern raised for acute posterior circulation thrombosis. Intracranial atherosclerotic disease elsewhere as described, with significant flow-limiting stenosis of the supraclinoid ICA on the LEFT. Essentially nondiagnostic MRA of the neck. Limited evaluation. Gross patency of  both common carotid arteries is established, but there is no diagnostic visualization of the carotid bifurcations or either vertebral artery. Electronically Signed   By: Elsie Stain M.D.   On: 01/27/2017 11:57   Mr Maxine Glenn Neck Wo Contrast  Result Date: 01/27/2017 CLINICAL DATA:  History of CVA and LEFT ICA stenosis presenting with RIGHT arm weakness and slurred speech. EXAM: MRI HEAD WITHOUT CONTRAST MRA HEAD WITHOUT CONTRAST MRA NECK WITHOUT CONTRAST TECHNIQUE: Multiplanar, multiecho pulse sequences of the brain and surrounding structures were obtained without intravenous contrast. Angiographic images of the Circle of Willis were obtained using MRA technique without intravenous contrast. Angiographic images of the neck were obtained using MRA technique without intravenous contrast. Carotid stenosis measurements (when applicable) are obtained utilizing NASCET criteria, using the distal internal carotid diameter as the denominator. COMPARISON:  CT head 01/26/2017.  MR head 07/27/2016. FINDINGS: MRI HEAD FINDINGS Brain: Restricted diffusion affects the LEFT paramedian pons in its midportion consistent with acute infarction. This measures approximately 1 cm in its longest dimension. There is no visible acute hemorrhage. Premature for age atrophy. Chronic microvascular ischemic change. Chronic LEFT occipital infarct. Chronic BILATERAL cerebellar infarcts. Chronic pontine infarcts. Chronic hemorrhage along the LEFT pons, more lateral to the area of acute infarction. Vascular: Reported separately. Skull and upper cervical spine: Normal marrow signal. Sinuses/Orbits: Chronic sinus disease. Some layering fluid LEFT maxillary region. BILATERAL cataract extraction. Other: None. MRA HEAD FINDINGS Flow reducing stenosis, 75% or greater, is C8 the junction of the RIGHT petrous and RIGHT cavernous ICA. Mild non stenotic irregularity in the cavernous and supraclinoid RIGHT ICA segments. Minor non stenotic irregularity of the  skull base ICA on the LEFT. Severe stenosis of the supraclinoid ICA on the LEFT, with short segment of no flow related enhancement implying 90% or greater stenosis. LEFT ICA terminus patent. Moderately disease RIGHT M1 MCA extending to the MCA bifurcation. Mild irregularity LEFT M1 MCA. Mild moderate disease at both anterior cerebrals, worse on the RIGHT. No MCA or ACA branch occlusion. Nonvisualization of the RIGHT and LEFT vertebral arteries. Nonvisualization of the proximal and mid segments of the basilar artery. Fetal origin RIGHT PCA. Patent LEFT posterior communicating artery appears to supply the LEFT P1 PCA, possible short-segment reflux down the distal vessel. On the previous MRA from 07/27/2016, the basilar artery was diseased but patent. Patent. MRA NECK FINDINGS Limited evaluation on this noncontrast exam. Gross patency BILATERAL common carotid arteries is is established. I am unable to assess for significant stenosis at the carotid bifurcations. There is no visible flow related enhancement of the vertebral arteries in the neck. IMPRESSION: Acute infarction of the LEFT paramedian pons representing an new area of ischemia compared with previous study from 07/27/2016. Atrophy and small vessel disease with multiple old infarcts. There is no appreciable flow related enhancement in either distal vertebral artery or the proximal to mid basilar artery. Concern raised for acute posterior circulation thrombosis. Intracranial atherosclerotic disease elsewhere as described, with significant flow-limiting stenosis of the supraclinoid ICA on the LEFT.  Essentially nondiagnostic MRA of the neck. Limited evaluation. Gross patency of both common carotid arteries is established, but there is no diagnostic visualization of the carotid bifurcations or either vertebral artery. Electronically Signed   By: Elsie Stain M.D.   On: 01/27/2017 11:57   Mr Brain Wo Contrast  Result Date: 01/27/2017 CLINICAL DATA:  History of  CVA and LEFT ICA stenosis presenting with RIGHT arm weakness and slurred speech. EXAM: MRI HEAD WITHOUT CONTRAST MRA HEAD WITHOUT CONTRAST MRA NECK WITHOUT CONTRAST TECHNIQUE: Multiplanar, multiecho pulse sequences of the brain and surrounding structures were obtained without intravenous contrast. Angiographic images of the Circle of Willis were obtained using MRA technique without intravenous contrast. Angiographic images of the neck were obtained using MRA technique without intravenous contrast. Carotid stenosis measurements (when applicable) are obtained utilizing NASCET criteria, using the distal internal carotid diameter as the denominator. COMPARISON:  CT head 01/26/2017.  MR head 07/27/2016. FINDINGS: MRI HEAD FINDINGS Brain: Restricted diffusion affects the LEFT paramedian pons in its midportion consistent with acute infarction. This measures approximately 1 cm in its longest dimension. There is no visible acute hemorrhage. Premature for age atrophy. Chronic microvascular ischemic change. Chronic LEFT occipital infarct. Chronic BILATERAL cerebellar infarcts. Chronic pontine infarcts. Chronic hemorrhage along the LEFT pons, more lateral to the area of acute infarction. Vascular: Reported separately. Skull and upper cervical spine: Normal marrow signal. Sinuses/Orbits: Chronic sinus disease. Some layering fluid LEFT maxillary region. BILATERAL cataract extraction. Other: None. MRA HEAD FINDINGS Flow reducing stenosis, 75% or greater, is C8 the junction of the RIGHT petrous and RIGHT cavernous ICA. Mild non stenotic irregularity in the cavernous and supraclinoid RIGHT ICA segments. Minor non stenotic irregularity of the skull base ICA on the LEFT. Severe stenosis of the supraclinoid ICA on the LEFT, with short segment of no flow related enhancement implying 90% or greater stenosis. LEFT ICA terminus patent. Moderately disease RIGHT M1 MCA extending to the MCA bifurcation. Mild irregularity LEFT M1 MCA. Mild  moderate disease at both anterior cerebrals, worse on the RIGHT. No MCA or ACA branch occlusion. Nonvisualization of the RIGHT and LEFT vertebral arteries. Nonvisualization of the proximal and mid segments of the basilar artery. Fetal origin RIGHT PCA. Patent LEFT posterior communicating artery appears to supply the LEFT P1 PCA, possible short-segment reflux down the distal vessel. On the previous MRA from 07/27/2016, the basilar artery was diseased but patent. Patent. MRA NECK FINDINGS Limited evaluation on this noncontrast exam. Gross patency BILATERAL common carotid arteries is is established. I am unable to assess for significant stenosis at the carotid bifurcations. There is no visible flow related enhancement of the vertebral arteries in the neck. IMPRESSION: Acute infarction of the LEFT paramedian pons representing an new area of ischemia compared with previous study from 07/27/2016. Atrophy and small vessel disease with multiple old infarcts. There is no appreciable flow related enhancement in either distal vertebral artery or the proximal to mid basilar artery. Concern raised for acute posterior circulation thrombosis. Intracranial atherosclerotic disease elsewhere as described, with significant flow-limiting stenosis of the supraclinoid ICA on the LEFT. Essentially nondiagnostic MRA of the neck. Limited evaluation. Gross patency of both common carotid arteries is established, but there is no diagnostic visualization of the carotid bifurcations or either vertebral artery. Electronically Signed   By: Elsie Stain M.D.   On: 01/27/2017 11:57    Assessment/Plan: Diagnosis:  Left paramedian pons infarct Labs and images independently reviewed.  Records reviewed and summated above. Stroke: Continue secondary stroke prophylaxis and  Risk Factor Modification listed below:   Antiplatelet therapy:   Blood Pressure Management:  Continue current medication with prn's with permisive HTN per primary team Statin  Agent:   Diabetes management:    1. Does the need for close, 24 hr/day medical supervision in concert with the patient's rehab needs make it unreasonable for this patient to be served in a less intensive setting? Yes 2. Co-Morbidities requiring supervision/potential complications: diabetes mellitus (Monitor in accordance with exercise and adjust meds as necessary), hyperlipidemia (cont meds), CKD (avoid nephrotoxic meds), right AKA 5 years ago, CVA approximately 6 months ago related to left ICA high-grade stenosis, HTN (monitor and provide prns in accordance with increased physical exertion and pain) 3. Due to safety, disease management and patient education, does the patient require 24 hr/day rehab nursing? Potentially 4. Does the patient require coordinated care of a physician, rehab nurse, PT (1-2 hrs/day, 5 days/week) and OT (1-2 hrs/day, 5 days/week) to address physical and functional deficits in the context of the above medical diagnosis(es)? Potentially Addressing deficits in the following areas: balance, endurance, locomotion, strength, transferring, bathing, dressing, toileting and psychosocial support 5. Can the patient actively participate in an intensive therapy program of at least 3 hrs of therapy per day at least 5 days per week? Yes 6. The potential for patient to make measurable gains while on inpatient rehab is excellent 7. Anticipated functional outcomes upon discharge from inpatient rehab are Supervision  with PT, supervision with OT, NA with SLP. 8. Estimated rehab length of stay to reach the above functional goals is: 7-11 days. 9. Anticipated D/C setting: Home 10. Anticipated post D/C treatments: HH therapy and Home excercise program 11. Overall Rehab/Functional Prognosis: good  RECOMMENDATIONS: This patient's condition is appropriate for continued rehabilitative care in the following setting: CIR after completion of medical workup. Patient has agreed to participate in  recommended program. Yes Note that insurance prior authorization may be required for reimbursement for recommended care.  Comment: Rehab Admissions Coordinator to follow up.  Maryla Morrow, MD, [place] Dom Charlton Amor., PA-C 01/29/2017

## 2017-01-29 NOTE — Progress Notes (Signed)
Internal Medicine Attending  Date: 01/29/2017  Patient name: Carolyn Werner Medical record number: [id number] Date of birth: 1921/06/16 Age: 83 y.o. Gender: female  I saw and evaluated the patient. I reviewed the resident's note by Dr. Saunders Revel and I agree with the resident's findings and plans as documented in her progress note.  When seen on rounds this morning [name] noted that she has gained further improvement in her right-sided strength and noted resolution of her slurred speech, although she felt her speech was still slowed somewhat. We discussed the recommended evaluation for her carotid stenosis via a CT angiogram with the patient and her daughter, and at this point, given their concerns about the dye load's effect on her renal function, they decided to forego that evaluation and continue on working on maximizing medical therapy. She's been evaluated by CIR and we are awaiting bed availability. From a medical standpoint, her evaluation has been completed and she is stable for transfer to CIR once accepted.

## 2017-01-29 NOTE — Progress Notes (Signed)
  Echocardiogram 2D Echocardiogram has been performed.  Arvil Chaco 01/29/2017, 3:07 PM

## 2017-01-29 NOTE — Progress Notes (Signed)
Physical Therapy Treatment Patient Details Name: Carolyn Werner MRN: [id number] DOB: 30-Jan-1922 Today's Date: 01/29/2017    History of Present Illness 83 yo female with onset of RUE weakness and noted improving but residual increased RUE weakness esp grip.  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  Hx of pituitary tumor.    PT Comments    Focus of treatment was on w/c propulsion and endurance training. Pt able to use R UE functionally however fatigued quickly requiring freq rest breaks. Pt was able to transfer self in/out of w/c and on/off commode indep PTA. Con't to recommend CIR upon d/c to progress indep with mobility as pt now with R UE weakness and impaired sensation.   Follow Up Recommendations  CIR     Equipment Recommendations  None recommended by PT    Recommendations for Other Services Rehab consult     Precautions / Restrictions Precautions Precautions: Fall Precaution Comments: R AKA Restrictions Weight Bearing Restrictions: No    Mobility  Bed Mobility               General bed mobility comments: pt up in w/c upon PT arrival  Transfers                 General transfer comment: didn't complete, worked on w/c mobility  Ambulation/Gait             General Gait Details: unable   Administrator mobility: Yes Wheelchair propulsion: Both upper extremities Wheelchair parts: Supervision/cueing Distance: 350 Wheelchair Assistance Details (indicate cue type and reason): pt required multiple rest breaks, reported R hand not feeling as strong as L however pt able to propel self in straight line and perform turns in small radius without assist and safely  Modified Rankin (Stroke Patients Only) Modified Rankin (Stroke Patients Only) Pre-Morbid Rankin Score: Moderate disability Modified  Rankin: Moderately severe disability     Balance                                            Cognition Arousal/Alertness: Awake/alert Behavior During Therapy: WFL for tasks assessed/performed Overall Cognitive Status: History of cognitive impairments - at baseline                                 General Comments: pt is a participant in adult day care program      Exercises      General Comments        Pertinent Vitals/Pain Pain Assessment: No/denies pain    Home Living                      Prior Function            PT Goals (current goals can now be found in the care plan section) Acute Rehab PT Goals Patient Stated Goal: to get back to PLOF Progress towards PT goals: Progressing toward goals    Frequency    Min 4X/week      PT Plan Frequency needs to be updated    Co-evaluation  AM-PAC PT "6 Clicks" Daily Activity  Outcome Measure  Difficulty turning over in bed (including adjusting bedclothes, sheets and blankets)?: Unable Difficulty moving from lying on back to sitting on the side of the bed? : Unable Difficulty sitting down on and standing up from a chair with arms (e.g., wheelchair, bedside commode, etc,.)?: Unable Help needed moving to and from a bed to chair (including a wheelchair)?: A Lot Help needed walking in hospital room?: Total Help needed climbing 3-5 steps with a railing? : Total 6 Click Score: 7    End of Session   Activity Tolerance: Patient tolerated treatment well Patient left: in chair;with call bell/phone within reach;with family/visitor present Nurse Communication: Mobility status PT Visit Diagnosis: Unsteadiness on feet (R26.81);Muscle weakness (generalized) (M62.81);Other abnormalities of gait and mobility (R26.89)     Time: 1610-9604 PT Time Calculation (min) (ACUTE ONLY): 28 min  Charges:  $Therapeutic Activity: 23-37 mins                    G Codes:        Lewis Shock, PT, DPT Pager #: [phone] Office #: [phone]    Reah Justo M Stephanny Tsutsui 01/29/2017, 2:31 PM

## 2017-01-29 NOTE — Progress Notes (Addendum)
STROKE TEAM PROGRESS NOTE   SUBJECTIVE (INTERVAL HISTORY) No family at the bedside.  Switched Agg[employer] to [employer] is working with OT and notes some improvement in handwriting today.  CIR admission pending.     OBJECTIVE Temp:  [97.4 F (36.3 C)-98.7 F (37.1 C)] 98.2 F (36.8 C) (08/27 1431) Pulse Rate:  [79-91] 84 (08/27 1431) Cardiac Rhythm: Normal sinus rhythm (08/27 0800) Resp:  [18-20] 18 (08/27 1431) BP: (116-161)/(62-84) 154/68 (08/27 1431) SpO2:  [96 %-100 %] 100 % (08/27 1431)  CBC:   Recent Labs Lab 01/26/17 1341 01/26/17 1355  WBC 5.6  --   NEUTROABS 3.5  --   HGB 12.3 13.6  HCT 37.2 40.0  MCV 88.4  --   PLT 154  --     Basic Metabolic Panel:   Recent Labs Lab 01/26/17 1341 01/26/17 1355 01/27/17 0443  NA 135 137 137  K 3.9 3.9 3.9  CL 104 102 105  CO2 23  --  24  GLUCOSE 219* 225* 122*  BUN 14 17 16   CREATININE 1.23* 1.20* 1.28*  CALCIUM 10.1  --  9.8    Lipid Panel:     Component Value Date/Time   CHOL 206 (H) 01/26/2017 2113   TRIG 217 (H) 01/26/2017 2113   HDL 29 (L) 01/26/2017 2113   CHOLHDL 7.1 01/26/2017 2113   VLDL 43 (H) 01/26/2017 2113   LDLCALC 134 (H) 01/26/2017 2113   HgbA1c:  Lab Results  Component Value Date   HGBA1C 6.9 (H) 01/26/2017   Urine Drug Screen:     Component Value Date/Time   LABOPIA NONE DETECTED 07/27/2016 1210   COCAINSCRNUR NONE DETECTED 07/27/2016 1210   LABBENZ NONE DETECTED 07/27/2016 1210   AMPHETMU NONE DETECTED 07/27/2016 1210   THCU NONE DETECTED 07/27/2016 1210   LABBARB NONE DETECTED 07/27/2016 1210    Alcohol Level     Component Value Date/Time   ETH <5 07/27/2016 1116    IMAGING I have personally reviewed the radiological images below and agree with the radiology interpretations.  Mr Maxine Glenn Head Wo Contrast Mr Maxine Glenn Neck Wo Contrast 01/27/2017 Acute infarction of the LEFT paramedian pons representing an new area of ischemia compared with previous study from 07/27/2016.  Atrophy  and small vessel disease with multiple old infarcts.  There is no appreciable flow related enhancement in either distal vertebral artery or the proximal to mid basilar artery. Concern raised for acute posterior circulation thrombosis.  Intracranial atherosclerotic disease elsewhere as described, with  significant flow-limiting stenosis of the supraclinoid ICA on the LEFT.  Essentially nondiagnostic MRA of the neck.  Limited evaluation. Gross patency of both common carotid arteries is established, but there is no diagnostic visualization of the carotid bifurcations or either vertebral artery.   CT Head Without Contrast  01/26/2017 Mild diffuse cortical atrophy. Mild chronic ischemic white matter disease. No acute intracranial abnormality seen.  Transthoracic Echocardiogram 01/29/2017 - Left ventricle: The cavity size was normal. There was mild focal   basal hypertrophy of the septum. Systolic function was normal.   The estimated ejection fraction was in the range of 60% to 65%.   Wall motion was normal; there were no regional wall motion   abnormalities. There was an increased relative contribution of   atrial contraction to ventricular filling. Doppler parameters are   consistent with abnormal left ventricular relaxation (grade 1   diastolic dysfunction). - Aortic valve: There was trivial regurgitation. - Mitral valve: Calcified annulus. - Left atrium:  The atrium was mildly dilated. - Atrial septum: There was increased thickness of the septum,   consistent with lipomatous hypertrophy.    PHYSICAL EXAM  Vitals:   01/29/17 0155 01/29/17 0615 01/29/17 1008 01/29/17 1431  BP: (!) 161/69 (!) 145/84 116/71 (!) 154/68  Pulse: 81 89 91 84  Resp: 20 20 18 18   Temp: 98.7 F (37.1 C) 97.9 F (36.6 C) 97.7 F (36.5 C) 98.2 F (36.8 C)  TempSrc: Oral Oral Oral Oral  SpO2: 96% 100% 100% 100%  Weight:      Height:       Ppleasant elderly african american lady not in distress.  Afebrile.   Head is nontraumatic.  Neck is supple without bruit.  Cardiac exam no murmur or gallop. Lungs are clear to auscultation. Distal pulses are well felt.  Neurological Exam ;  awake alert oriented x3. Follows midline and peripheral commands. No aphasia or dysarthria. PERRL, EOMI, no nystagmus. Fundi not visualized. No significant lower facial weakness. Tongue midline. Mild right wrist extension weakness, 4/5 strength. Weakness of right grip and intrinsic hand muscles. Right AKA. Deep tendon reflexes are symmetric. Plantars downgoing. Gait not tested.  ASSESSMENT/PLAN Ms. Roniqua Marruffo is a 83 y.o. female with history of previous strokes, hyperlipidemia, chronic kidney disease, severe left ICA stenosis, peripheral vascular disease, headaches, diabetes mellitus, and history of benign neoplasm of the pituitary gland presenting with right upper extremity weakness and slurred speech.  She did not receive IV t-PA due to late presentation.  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  Resultant  Right hand weaknss  CT head - no acute abnormality seen.  MRI head - acute infarction of the LEFT paramedian pons  MRA head - no appreciable flow related enhancement in either distal vertebral artery or the proximal to mid basilar artery. Concern raised for acute posterior circulation thrombosis.   Carotid Doppler - 07/2016 - left ICA 80-99% stenosis  2D Echo - EF 60-65%  LDL - 134  HgbA1c - 6.9  VTE prophylaxis - Lovenox Diet Heart Room service appropriate? Yes; Fluid consistency: Thin  Aggrenox prior to admission, now on aspirin 81 mg daily and ticagerolor 90 mg BID. Continue ASA and brilinta on discharge.   Patient counseled to be compliant with her antithrombotic medications  Ongoing aggressive stroke risk factor management  Therapy recommendations:  CIR  Disposition: Pending  Multifocal extra- and intracranial stenosis  MRA and CUS confirmed these  findings  Allergic to plavix in the past  Switch to brilinta and ASA  Not revascularization candidate, pt and family also refused further vascular work up  Hx of stroke 07/2016  b/l cerebellar and b/l pontine infarcts  MRA left PCA/VA occlusion and left siphon high grade stenosis  CUS left 80-99% left ICA stenosis, EF60-65%  VVS consulted and recommend medical management  LDL 120 and A1C 7.1.  ASA changed to aggrenox, add crestor  Hypertension  Stable Permissive hypertension (OK if < 220/120) but gradually normalize in 5-7 days Long-term BP goal 130-160 due to severe vascular stenosis  Hyperlipidemia  Home meds:  No lipid lowering medications prior to admission  LDL 134, goal < 70  Statin allergy  Follow up with PCP to consider PCSK9 inhibitors  Diabetes  HgbA1c 6.9, goal < 7.0  Controlled  SSI  Other Stroke Risk Factors  Advanced age  Former cigarette smoker - quit 43 years ago  Other Active Problems  Right AKA  CKD - creatinine 1.28  Neurology will  sign off. Please call with questions. Pt will follow up with Dr. Pearlean Brownie at [facility] in about 6 weeks. Thanks for the consult.   Hospital day # 2 Marvel Plan, MD PhD Stroke Neurology 01/29/2017 9:58 PM   To contact Stroke Continuity provider, please refer to [email or web address]. After hours, contact General Neurology

## 2017-01-29 NOTE — Progress Notes (Signed)
Inpatient Rehabilitation  Met with patient at bedside to discuss team's recommendation for IP Rehab.  Shared booklets and answered questions.  Per patient's request I also shared information with daughter via phone.  Plan to follow along for timing of medical readiness and IP Rehab bed availability.  Please call with questions.   Melissa Bowie, M.A., CCC/SLP Admission Coordinator  [facility]  Cell [phone]   

## 2017-01-30 ENCOUNTER — Encounter (HOSPITAL_COMMUNITY): Payer: Self-pay | Admitting: *Deleted

## 2017-01-30 ENCOUNTER — Encounter (HOSPITAL_COMMUNITY): Payer: Self-pay | Admitting: Physical Medicine and Rehabilitation

## 2017-01-30 ENCOUNTER — Inpatient Hospital Stay (HOSPITAL_COMMUNITY)
Admission: RE | Admit: 2017-01-30 | Discharge: 2017-02-07 | DRG: 057 | Disposition: A | Discharge status: Home Health | Payer: Medicare Other | Source: Intra-hospital | Attending: Physical Medicine & Rehabilitation | Admitting: Physical Medicine & Rehabilitation

## 2017-01-30 DIAGNOSIS — Z993 Dependence on wheelchair: Secondary | ICD-10-CM | POA: Diagnosis not present

## 2017-01-30 DIAGNOSIS — I6310 Cerebral infarction due to embolism of unspecified precerebral artery: Secondary | ICD-10-CM | POA: Diagnosis not present

## 2017-01-30 DIAGNOSIS — G546 Phantom limb syndrome with pain: Secondary | ICD-10-CM | POA: Diagnosis present

## 2017-01-30 DIAGNOSIS — Z7951 Long term (current) use of inhaled steroids: Secondary | ICD-10-CM

## 2017-01-30 DIAGNOSIS — I6522 Occlusion and stenosis of left carotid artery: Secondary | ICD-10-CM | POA: Diagnosis present

## 2017-01-30 DIAGNOSIS — J45909 Unspecified asthma, uncomplicated: Secondary | ICD-10-CM | POA: Diagnosis present

## 2017-01-30 DIAGNOSIS — J4530 Mild persistent asthma, uncomplicated: Secondary | ICD-10-CM | POA: Diagnosis present

## 2017-01-30 DIAGNOSIS — E739 Lactose intolerance, unspecified: Secondary | ICD-10-CM | POA: Diagnosis present

## 2017-01-30 DIAGNOSIS — Z87891 Personal history of nicotine dependence: Secondary | ICD-10-CM | POA: Diagnosis not present

## 2017-01-30 DIAGNOSIS — G8929 Other chronic pain: Secondary | ICD-10-CM | POA: Diagnosis present

## 2017-01-30 DIAGNOSIS — Z79899 Other long term (current) drug therapy: Secondary | ICD-10-CM

## 2017-01-30 DIAGNOSIS — Z7982 Long term (current) use of aspirin: Secondary | ICD-10-CM | POA: Diagnosis not present

## 2017-01-30 DIAGNOSIS — I69393 Ataxia following cerebral infarction: Secondary | ICD-10-CM

## 2017-01-30 DIAGNOSIS — G8191 Hemiplegia, unspecified affecting right dominant side: Secondary | ICD-10-CM | POA: Diagnosis not present

## 2017-01-30 DIAGNOSIS — Z89611 Acquired absence of right leg above knee: Secondary | ICD-10-CM

## 2017-01-30 DIAGNOSIS — Z888 Allergy status to other drugs, medicaments and biological substances status: Secondary | ICD-10-CM

## 2017-01-30 DIAGNOSIS — M5030 Other cervical disc degeneration, unspecified cervical region: Secondary | ICD-10-CM | POA: Diagnosis present

## 2017-01-30 DIAGNOSIS — E1122 Type 2 diabetes mellitus with diabetic chronic kidney disease: Secondary | ICD-10-CM | POA: Diagnosis present

## 2017-01-30 DIAGNOSIS — Z8249 Family history of ischemic heart disease and other diseases of the circulatory system: Secondary | ICD-10-CM

## 2017-01-30 DIAGNOSIS — K589 Irritable bowel syndrome without diarrhea: Secondary | ICD-10-CM | POA: Diagnosis not present

## 2017-01-30 DIAGNOSIS — I6350 Cerebral infarction due to unspecified occlusion or stenosis of unspecified cerebral artery: Secondary | ICD-10-CM

## 2017-01-30 DIAGNOSIS — K5909 Other constipation: Secondary | ICD-10-CM | POA: Diagnosis present

## 2017-01-30 DIAGNOSIS — E1151 Type 2 diabetes mellitus with diabetic peripheral angiopathy without gangrene: Secondary | ICD-10-CM

## 2017-01-30 DIAGNOSIS — Z880 Allergy status to penicillin: Secondary | ICD-10-CM

## 2017-01-30 DIAGNOSIS — N183 Chronic kidney disease, stage 3 unspecified: Secondary | ICD-10-CM | POA: Diagnosis present

## 2017-01-30 DIAGNOSIS — I10 Essential (primary) hypertension: Secondary | ICD-10-CM | POA: Diagnosis not present

## 2017-01-30 DIAGNOSIS — E1142 Type 2 diabetes mellitus with diabetic polyneuropathy: Secondary | ICD-10-CM

## 2017-01-30 DIAGNOSIS — I129 Hypertensive chronic kidney disease with stage 1 through stage 4 chronic kidney disease, or unspecified chronic kidney disease: Secondary | ICD-10-CM | POA: Diagnosis present

## 2017-01-30 DIAGNOSIS — K581 Irritable bowel syndrome with constipation: Secondary | ICD-10-CM | POA: Diagnosis present

## 2017-01-30 DIAGNOSIS — I69351 Hemiplegia and hemiparesis following cerebral infarction affecting right dominant side: Principal | ICD-10-CM

## 2017-01-30 DIAGNOSIS — Z886 Allergy status to analgesic agent status: Secondary | ICD-10-CM

## 2017-01-30 DIAGNOSIS — I639 Cerebral infarction, unspecified: Secondary | ICD-10-CM | POA: Diagnosis present

## 2017-01-30 DIAGNOSIS — M545 Low back pain: Secondary | ICD-10-CM | POA: Diagnosis present

## 2017-01-30 DIAGNOSIS — R2689 Other abnormalities of gait and mobility: Secondary | ICD-10-CM | POA: Diagnosis present

## 2017-01-30 MED ORDER — DULOXETINE HCL 30 MG PO CPEP
30.0000 mg | ORAL_CAPSULE | Freq: Every day | ORAL | Status: DC
  Administered 2017-01-31 – 2017-02-07 (×8): 30 mg via ORAL
  Filled 2017-01-30 (×8): qty 1

## 2017-01-30 MED ORDER — TRAZODONE HCL 50 MG PO TABS: 25.0000 mg | ORAL_TABLET | Freq: Every evening | ORAL | Status: DC | PRN

## 2017-01-30 MED ORDER — DIPHENHYDRAMINE HCL 12.5 MG/5ML PO ELIX: 12.5000 mg | ORAL_SOLUTION | Freq: Four times a day (QID) | ORAL | Status: DC | PRN

## 2017-01-30 MED ORDER — ASPIRIN 81 MG PO CHEW
81.0000 mg | CHEWABLE_TABLET | Freq: Every day | ORAL | Status: DC
  Administered 2017-01-31 – 2017-02-07 (×8): 81 mg via ORAL
  Filled 2017-01-30 (×8): qty 1

## 2017-01-30 MED ORDER — TICAGRELOR 90 MG PO TABS: 90.0000 mg | ORAL_TABLET | Freq: Two times a day (BID) | ORAL | 0 refills | Status: DC

## 2017-01-30 MED ORDER — LORATADINE 10 MG PO TABS
10.0000 mg | ORAL_TABLET | Freq: Every day | ORAL | Status: DC
  Administered 2017-01-31 – 2017-02-07 (×8): 10 mg via ORAL
  Filled 2017-01-30 (×8): qty 1

## 2017-01-30 MED ORDER — SALINE SPRAY 0.65 % NA SOLN: 1.0000 | Freq: Every day | NASAL | Status: DC | PRN

## 2017-01-30 MED ORDER — GABAPENTIN 100 MG PO CAPS
200.0000 mg | ORAL_CAPSULE | Freq: Every day | ORAL | Status: DC
  Administered 2017-01-30 – 2017-02-06 (×8): 200 mg via ORAL
  Filled 2017-01-30 (×8): qty 2

## 2017-01-30 MED ORDER — PROCHLORPERAZINE EDISYLATE 5 MG/ML IJ SOLN: 5.0000 mg | Freq: Four times a day (QID) | INTRAMUSCULAR | Status: DC | PRN

## 2017-01-30 MED ORDER — LATANOPROST 0.005 % OP SOLN
1.0000 [drp] | Freq: Every day | OPHTHALMIC | Status: DC
  Administered 2017-01-30 – 2017-02-06 (×8): 1 [drp] via OPHTHALMIC
  Filled 2017-01-30 (×2): qty 2.5

## 2017-01-30 MED ORDER — BUDESONIDE 0.25 MG/2ML IN SUSP
0.2500 mg | Freq: Every day | RESPIRATORY_TRACT | Status: DC
  Administered 2017-01-31 – 2017-02-07 (×7): 0.25 mg via RESPIRATORY_TRACT
  Filled 2017-01-30 (×8): qty 2

## 2017-01-30 MED ORDER — BISACODYL 10 MG RE SUPP
10.0000 mg | Freq: Every day | RECTAL | Status: DC | PRN
  Administered 2017-02-05: 10 mg via RECTAL
  Filled 2017-01-30: qty 1

## 2017-01-30 MED ORDER — ADULT MULTIVITAMIN W/MINERALS CH
1.0000 | ORAL_TABLET | Freq: Every day | ORAL | Status: DC
  Administered 2017-01-31 – 2017-02-07 (×8): 1 via ORAL
  Filled 2017-01-30 (×8): qty 1

## 2017-01-30 MED ORDER — TICAGRELOR 90 MG PO TABS
90.0000 mg | ORAL_TABLET | Freq: Two times a day (BID) | ORAL | Status: DC
  Administered 2017-01-30 – 2017-02-07 (×16): 90 mg via ORAL
  Filled 2017-01-30 (×15): qty 1

## 2017-01-30 MED ORDER — ALUM & MAG HYDROXIDE-SIMETH 200-200-20 MG/5ML PO SUSP: 30.0000 mL | ORAL | Status: DC | PRN

## 2017-01-30 MED ORDER — FLEET ENEMA 7-19 GM/118ML RE ENEM: 1.0000 | ENEMA | Freq: Once | RECTAL | Status: DC | PRN

## 2017-01-30 MED ORDER — POLYETHYLENE GLYCOL 3350 17 G PO PACK: 17.0000 g | PACK | Freq: Every day | ORAL | Status: DC | PRN

## 2017-01-30 MED ORDER — GABAPENTIN 100 MG PO CAPS
100.0000 mg | ORAL_CAPSULE | Freq: Every day | ORAL | Status: DC
  Administered 2017-01-31 – 2017-02-07 (×8): 100 mg via ORAL
  Filled 2017-01-30 (×8): qty 1

## 2017-01-30 MED ORDER — ACETAMINOPHEN 325 MG PO TABS
325.0000 mg | ORAL_TABLET | ORAL | Status: DC | PRN
Start: 2017-01-30 — End: 2017-02-07
  Administered 2017-02-02 – 2017-02-06 (×3): 650 mg via ORAL
  Filled 2017-01-30 (×3): qty 2

## 2017-01-30 MED ORDER — LEVALBUTEROL HCL 0.63 MG/3ML IN NEBU
0.6300 mg | INHALATION_SOLUTION | Freq: Four times a day (QID) | RESPIRATORY_TRACT | Status: DC | PRN
  Administered 2017-02-01: 0.63 mg via RESPIRATORY_TRACT
  Filled 2017-01-30: qty 3

## 2017-01-30 MED ORDER — FLUTICASONE PROPIONATE 50 MCG/ACT NA SUSP
2.0000 | Freq: Every day | NASAL | Status: DC | PRN
Start: 2017-01-30 — End: 2017-02-07
  Administered 2017-02-02 – 2017-02-03 (×2): 2 via NASAL
  Filled 2017-01-30 (×2): qty 16

## 2017-01-30 MED ORDER — PROCHLORPERAZINE MALEATE 5 MG PO TABS: 5.0000 mg | ORAL_TABLET | Freq: Four times a day (QID) | ORAL | Status: DC | PRN

## 2017-01-30 MED ORDER — NAPHAZOLINE-GLYCERIN 0.012-0.2 % OP SOLN
2.0000 [drp] | Freq: Four times a day (QID) | OPHTHALMIC | Status: DC | PRN
  Filled 2017-01-30: qty 15

## 2017-01-30 MED ORDER — VITAMIN D 1000 UNITS PO TABS
2000.0000 [IU] | ORAL_TABLET | Freq: Every day | ORAL | Status: DC
  Administered 2017-01-31 – 2017-02-07 (×8): 2000 [IU] via ORAL
  Filled 2017-01-30 (×8): qty 2

## 2017-01-30 MED ORDER — GUAIFENESIN-DM 100-10 MG/5ML PO SYRP: 5.0000 mL | ORAL_SOLUTION | Freq: Four times a day (QID) | ORAL | Status: DC | PRN

## 2017-01-30 MED ORDER — ENOXAPARIN SODIUM 30 MG/0.3ML ~~LOC~~ SOLN
30.0000 mg | SUBCUTANEOUS | Status: DC
  Administered 2017-01-30 – 2017-02-06 (×8): 30 mg via SUBCUTANEOUS
  Filled 2017-01-30 (×8): qty 0.3

## 2017-01-30 MED ORDER — SENNOSIDES-DOCUSATE SODIUM 8.6-50 MG PO TABS
1.0000 | ORAL_TABLET | Freq: Every day | ORAL | Status: DC
  Administered 2017-01-31 – 2017-02-06 (×6): 1 via ORAL
  Filled 2017-01-30 (×7): qty 1

## 2017-01-30 MED ORDER — PROCHLORPERAZINE 25 MG RE SUPP: 12.5000 mg | Freq: Four times a day (QID) | RECTAL | Status: DC | PRN

## 2017-01-30 NOTE — Progress Notes (Signed)
Received pt. As a transfer.Pt. And her daughter were oriented to the unit protocol.Safety plan was explained,fall prevention plan was signed by pt. And RN.

## 2017-01-30 NOTE — Progress Notes (Signed)
Occupational Therapy Treatment Patient Details Name: Carolyn Werner MRN: [id number] DOB: 1921/07/06 Today's Date: 01/30/2017    History of present illness 83 yo female with onset of RUE weakness and noted improving but residual increased RUE weakness esp grip.  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  Hx of pituitary tumor.   OT comments  Pt demonstrating progress toward OT goals. She demonstrates improving R hand fine motor coordination to participate in handwriting and grooming tasks this session. Facilitated improved in hand manipulation skills with rotation and finger-to-palm translation tasks this session. Noted continued difficulty to separate two sides of her hand but pt is demonstrating steady improvement. Additionally, pt was able to maneuver w/c in room with supervision to optimize position at tray table. D/C recommendation remains appropriate. OT will continue to follow while admitted.   Follow Up Recommendations  CIR;Supervision/Assistance - 24 hour    Equipment Recommendations  Other (comment) (TBD at next venue of care)    Recommendations for Other Services Rehab consult    Precautions / Restrictions Precautions Precautions: Fall Precaution Comments: R AKA Restrictions Weight Bearing Restrictions: No Other Position/Activity Restrictions: R AKA with phantom pain and L Knee and leg very sensitive from PVD       Mobility Bed Mobility               General bed mobility comments: Pt OOB in w/c on OT arrival.   Transfers                      Balance Overall balance assessment: Needs assistance Sitting-balance support: Single extremity supported;Feet supported Sitting balance-Leahy Scale: Fair                                     ADL either performed or assessed with clinical judgement   ADL Overall ADL's : Needs assistance/impaired      Grooming: Brushing hair;Sitting;Minimal assistance Grooming Details (indicate cue type and reason): Able to brush her hair with assistance for thoroughness this session. Improved ability to manipulate comb.                                General ADL Comments: Session focused on seated grooming tasks as well as improved fine motor coordination and hand strength in preparation for preferred leisure occupation of crocheting. Pt demonstrating improved ability to manipulate putty this session. Facilitated improved fine motor coordination with rotation tasks with R hand utilizing pen with built-up handle. Pt better able to complete simple writing tasks and was able to legibly write her name.      Vision Baseline Vision/History: Glaucoma Patient Visual Report: No change from baseline Additional Comments: Pt with glaucoma at baseline. Utilized glasses during fine motor coordination activities.    Perception     Praxis      Cognition Arousal/Alertness: Awake/alert Behavior During Therapy: WFL for tasks assessed/performed Overall Cognitive Status: History of cognitive impairments - at baseline                                 General Comments: pt is a participant in adult day care program        Exercises  Other Exercises Other Exercises: Facilitated improved fine motor coordination with in hand manipulation tasks with R hand including rotation with pen with built-up handle, finger to palm translation, as well as pinch and drop with small balls of putty.  Other Exercises: Facilitated improved w/c mobility tasks with pt able to complete with supervision to approach table prior to participation in fine motor activities.    Shoulder Instructions       General Comments      Pertinent Vitals/ Pain       Pain Assessment: No/denies pain  Home Living                                          Prior Functioning/Environment               Frequency  Min 3X/week        Progress Toward Goals  OT Goals(current goals can now be found in the care plan section)  Progress towards OT goals: Progressing toward goals  Acute Rehab OT Goals Patient Stated Goal: to be able to crochet again OT Goal Formulation: With patient/family Time For Goal Achievement: 02/10/17 Potential to Achieve Goals: Good  Plan Discharge plan remains appropriate;Frequency remains appropriate    Co-evaluation                 AM-PAC PT "6 Clicks" Daily Activity     Outcome Measure   Help from another person eating meals?: A Little Help from another person taking care of personal grooming?: A Little Help from another person toileting, which includes using toliet, bedpan, or urinal?: A Lot Help from another person bathing (including washing, rinsing, drying)?: A Lot Help from another person to put on and taking off regular upper body clothing?: A Little Help from another person to put on and taking off regular lower body clothing?: A Lot 6 Click Score: 15    End of Session Equipment Utilized During Treatment: Other (comment) (wheelchair)  OT Visit Diagnosis: Unsteadiness on feet (R26.81);Muscle weakness (generalized) (M62.81);Hemiplegia and hemiparesis Hemiplegia - Right/Left: Right Hemiplegia - dominant/non-dominant: Dominant Hemiplegia - caused by: Cerebral infarction   Activity Tolerance Patient tolerated treatment well   Patient Left Other (comment) (up in wheelchair with leader in room for rounds)   Nurse Communication Mobility status        Time: 6067-7034 OT Time Calculation (min): 24 min  Charges: OT General Charges $OT Visit: 1 Visit OT Treatments $Therapeutic Activity: 8-22 mins $Therapeutic Exercise: 8-22 mins  Doristine Section, MS OTR/L  Pager: [phone]    Siennah Barrasso A Anetra Czerwinski 01/30/2017, 10:14 AM

## 2017-01-30 NOTE — Discharge Summary (Signed)
Name: Carolyn Werner MRN: [id number] DOB: 1921-12-19 83 y.o. PCP: Lynn Ito, MD  Date of Admission: 01/26/2017  4:40 PM Date of Discharge: 01/30/2017 Attending Physician: Doneen Poisson, MD  Discharge Diagnosis: Active Problems:   Right arm weakness   Weakness   Cerebral thrombosis with cerebral infarction   Ataxia, post-stroke   Diabetes mellitus type 2 in nonobese [facility])   Stage 3 chronic kidney disease   History of right above knee amputation ([name])   History of CVA (cerebrovascular accident)   Benign essential HTN   Transient cerebral ischemia  Discharge Medications: Allergies as of 01/30/2017      Reactions   Atacand Hct [candesartan Cilexetil-hctz] Other (See Comments)   Kidney disease   Carbamazepine Other (See Comments)   Changes in ability to read, think clearly, unsteady gait   Erythromycin Diarrhea   Glucophage [metformin Hcl] Other (See Comments)   Chronic low blood sugar   Lactose Intolerance (gi) Diarrhea, Nausea And Vomiting   Nausea & Vomiting; Diarrhea   Lipitor [atorvastatin] Other (See Comments)   Muscle weakness   Nsaids Nausea And Vomiting   Motrin, Naprosyn, Voltaren, Celebrex, Prevacid, Mobic -- abdominal pain and burning, cramps, loose stool   Penicillins Shortness Of Breath, Rash   Has patient had a PCN reaction causing immediate rash, facial/tongue/throat swelling, SOB or lightheadedness with hypotension: Yes Has patient had a PCN reaction causing severe rash involving mucus membranes or skin necrosis: No Has patient had a PCN reaction that required hospitalization: No Has patient had a PCN reaction occurring within the last 10 years: No If all of the above answers are "NO", then may proceed with Cephalosporin use.   Sulfa Antibiotics Anaphylaxis   Ceclor [cefaclor] Hives   Clindamycin/lincomycin Other (See Comments)   Abdominal pain, loss of appetite   Rosuvastatin Calcium Other (See Comments)   Lethargy, malaise   Dilaudid  [hydromorphone Hcl] Rash   Plavix [clopidogrel] Rash, Other (See Comments)   Skin peeled, hair fell out      Medication List    STOP taking these medications   dipyridamole-aspirin 200-25 MG 12hr capsule Commonly known as:  AGGRENOX     TAKE these medications   acetaminophen 500 MG tablet Commonly known as:  TYLENOL Take 500-1,000 mg by mouth every 6 (six) hours as needed (pain).   amLODipine 2.5 MG tablet Commonly known as:  NORVASC Take 2.5 mg by mouth daily with supper.   aspirin 81 MG EC tablet Take 1 tablet (81 mg total) by mouth daily.   Biotin 5 MG Tabs Take 5 mg by mouth daily with supper.   budesonide 0.25 MG/2ML nebulizer solution Commonly known as:  PULMICORT Take 0.25 mg by nebulization See admin instructions. Inhale 1 vial (0.25 mg) via nebulization every other night   docusate sodium 100 MG capsule Commonly known as:  COLACE Take 200 mg by mouth 2 (two) times daily.   DULoxetine 30 MG capsule Commonly known as:  CYMBALTA Take 30 mg by mouth daily.   Flaxseed Oil 1000 MG Caps Take 1,000 mg by mouth 3 (three) times daily.   fluticasone 50 MCG/ACT nasal spray Commonly known as:  FLONASE Place 1 spray into both nostrils daily as needed for allergies or rhinitis.   gabapentin 100 MG capsule Commonly known as:  NEURONTIN Take 2 capsules (200 mg total) by mouth at bedtime. What changed:  Another medication with the same name was changed. Make sure you understand how and when to take each.   gabapentin  100 MG capsule Commonly known as:  NEURONTIN Take 1 capsule (100 mg total) by mouth every morning. What changed:  how much to take  when to take this  additional instructions   latanoprost 0.005 % ophthalmic solution Commonly known as:  XALATAN Place 1 drop into both eyes at bedtime.   levalbuterol 45 MCG/ACT inhaler Commonly known as:  XOPENEX HFA Inhale 2 puffs into the lungs every 4 (four) hours as needed for wheezing.   loratadine 10 MG  tablet Commonly known as:  CLARITIN Take 10 mg by mouth daily.   multivitamin with minerals Tabs tablet Take 1 tablet by mouth daily.   sodium chloride 0.65 % Soln nasal spray Commonly known as:  OCEAN Place 1 spray into both nostrils daily as needed for congestion.   ticagrelor 90 MG Tabs tablet Commonly known as:  BRILINTA Take 1 tablet (90 mg total) by mouth 2 (two) times daily.   traMADol 50 MG tablet Commonly known as:  ULTRAM Take 50 mg by mouth 2 (two) times daily as needed (pain).   VISINE OP Place 1 drop into both eyes 2 (two) times daily as needed (dry eyes).   Vitamin D 2000 units Caps Take 2,000 Units by mouth daily.            Discharge Care Instructions        Start     Ordered   01/30/17 0000  ticagrelor (BRILINTA) 90 MG TABS tablet  2 times daily     01/30/17 1044   01/30/17 0000  Increase activity slowly     01/30/17 1044   01/30/17 0000  Diet - low sodium heart healthy     01/30/17 1044   01/30/17 0000  Discharge instructions    Comments:  Please follow up with neurology regarding your recent stroke and hospitalization. They want to see you 6 weeks after discharge. Please follow up with your PCP or cardiologist regarding your hospitalization and new stroke to talk about medications you can take to lower your cholesterol.   01/30/17 1044   01/30/17 0000  Call MD for:  temperature >100.4     01/30/17 1044   01/30/17 0000  Call MD for:  severe uncontrolled pain     01/30/17 1044   01/30/17 0000  Call MD for:  difficulty breathing, headache or visual disturbances     01/30/17 1044   01/29/17 0000  Ambulatory referral to Neurology    Comments:  Pt will follow up with Dr. Pearlean Brownie at [facility] in about 2 months. Thanks.   01/29/17 2210     Disposition and follow-up:   [name] was discharged from [facility] in Good condition.  At the hospital follow up visit please address:  1.  Carolyn Werner presented to the hospital  with acute right upper extremity weakness and slurred speech. She was found to have a new, left paramedian pontine stroke. She has known atherosclerotic disease (L ICA 80-99% stenosed in 07/2016). The patient declined surgical intervention for L ICA disease and was started on dual antiplatelet therapy (aspirin 81 mg, brillanta 90 mg BID). Patient previously intolerant of statins and plans to discuss HLD management as outpatient.  2.  Labs / imaging needed at time of follow-up: None  3.  Pending labs/ test needing follow-up: None  Follow-up Appointments: Follow-up Information    Micki Riley, MD. Schedule an appointment as soon as possible for a visit in 6 week(s).   Specialties:  Neurology, Radiology  Contact information: [address] [place] [address] [phone]        Lynn Ito, MD. Schedule an appointment as soon as possible for a visit.   Specialty:  Internal Medicine Why:  Please follow up with your PCP after leaving rehabilitation to talk about your recent hospitalization and to talk about medications for lowering your cholesterol. Contact information: [address] [place] [address] [phone]           Hospital Course by problem list: Active Problems:   Right arm weakness   Weakness   Cerebral thrombosis with cerebral infarction   Ataxia, post-stroke   Diabetes mellitus type 2 in nonobese ([name])   Stage 3 chronic kidney disease   History of right above knee amputation ([name])   History of CVA (cerebrovascular accident)   Benign essential HTN   Transient cerebral ischemia   Carolyn Werner is a 83 yo woman with a PMH of CVA, left ICA stenosis (80-99% stenosis on 07/2016), PVD with R AKA, HTN, HLD, and DM who presented with acute onset right arm weakness and slurred speech. She was admitted to the internal medicine teaching service with neurology consulting. The specific problems addressed during admission are as  follows:  Left paramedian pons stroke with RUE weakness, slurred speech:The patient's acute onset of right sided weakness and slurred speech, along with history of stroke and known atherosclerotic disease of the left ICA, was concerning for a new stroke. Brain MRI showed a new left paramedian pons stroke with flow limiting stenosis of ICA on left (consistent with known L ICA disease). Neurology recommend CTA for further evaluation of L ICA stenosis and possible surgical intervention. Per discussion with patient and daughter there was no interest in this study, as they ultimately decided that the patient will not undergo surgical intervention whatever the study showed. The patient expressed interest in lifestyle modifications/risk reduction strategies, including new HLD medications (intolerant of statins) and antiplatelet therapy. The patient was started on brillinta, 90 mg BID and aspirin 81 mg for antiplatelet therapy. She will follow up with neurology in 6 weeks regarding her recent stroke. She will also follow up with PCP and cardiologist regarding possible medication regimens to start as an outpatient for lowering her cholesterol.   Hx of PVD with R AKA:The patient was provided home cymbalta 30 mg and gapapentin 300 mg daily (100mg  qAM and 200qPM) for persistent phantom pains in R lower extremity throughout hospitalization. This problem was well managed during hospitalization on this regimen.   Hx of HTN: The patient's home BP medication was held during hospitalization for permissive hypertension. She was instructed to restart home amlodipine on discharge.  Discharge Vitals:   BP 137/73 (BP Location: Right Arm)   Pulse 84   Temp (!) 97.5 F (36.4 C) (Oral)   Resp 18   Ht 5\' 4"  (1.626 m)   Wt 135 lb 1.6 oz (61.3 kg)   SpO2 99%   BMI 23.19 kg/m   Pertinent Labs, Studies, and Procedures:   BMP BMP Latest Ref Rng & Units 01/27/2017 01/26/2017 01/26/2017  Glucose 65 - 99 mg/dL 469(G) 295(M)  841(L)  BUN 6 - 20 mg/dL 16 17 14   Creatinine 0.44 - 1.00 mg/dL 2.44(W) 1.02(V) 2.53(G)  Sodium 135 - 145 mmol/L 137 137 135  Potassium 3.5 - 5.1 mmol/L 3.9 3.9 3.9  Chloride 101 - 111 mmol/L 105 102 104  CO2 22 - 32 mmol/L 24 - 23  Calcium 8.9 -  10.3 mg/dL 9.8 - 13.0   CBC CBC Latest Ref Rng & Units 01/26/2017 01/26/2017 07/27/2016  WBC 4.0 - 10.5 K/uL - 5.6 6.3  Hemoglobin 12.0 - 15.0 g/dL 86.5 78.4 69.6  Hematocrit 36.0 - 46.0 % 40.0 37.2 38.1  Platelets 150 - 400 K/uL - 154 139(L)   Hemoglobin A1C = 6.9%  Lipid Panel     Component Value Date/Time   CHOL 206 (H) 01/26/2017 2113   TRIG 217 (H) 01/26/2017 2113   HDL 29 (L) 01/26/2017 2113   CHOLHDL 7.1 01/26/2017 2113   VLDL 43 (H) 01/26/2017 2113   LDLCALC 134 (H) 01/26/2017 2113   MR brain and MRA head/neck: IMPRESSION: Acute infarction of the LEFT paramedian pons representing an new area of ischemia compared with previous study from 07/27/2016.  Atrophy and small vessel disease with multiple old infarcts.  There is no appreciable flow related enhancement in either distal vertebral artery or the proximal to mid basilar artery. Concern raised for acute posterior circulation thrombosis.  Intracranial atherosclerotic disease elsewhere as described, with significant flow-limiting stenosis of the supraclinoid ICA on the LEFT.  Essentially nondiagnostic MRA of the neck. Limited evaluation. Gross patency of both common carotid arteries is established, but there is no diagnostic visualization of the carotid bifurcations or either vertebral artery.  Echocardiography on 8/27 Study Conclusions - Left ventricle: The cavity size was normal. There was mild focal   basal hypertrophy of the septum. Systolic function was normal.   The estimated ejection fraction was in the range of 60% to 65%.   Wall motion was normal; there were no regional wall motion   abnormalities. There was an increased relative contribution of    atrial contraction to ventricular filling. Doppler parameters are   consistent with abnormal left ventricular relaxation (grade 1   diastolic dysfunction). - Aortic valve: There was trivial regurgitation. - Mitral valve: Calcified annulus. - Left atrium: The atrium was mildly dilated. - Atrial septum: There was increased thickness of the septum,   consistent with lipomatous hypertrophy.  Discharge Instructions: Discharge Instructions    Ambulatory referral to Neurology    Complete by:  As directed    Pt will follow up with Dr. Pearlean Brownie at [facility] in about 2 months. Thanks.   Call MD for:  difficulty breathing, headache or visual disturbances    Complete by:  As directed    Call MD for:  severe uncontrolled pain    Complete by:  As directed    Call MD for:  temperature >100.4    Complete by:  As directed    Diet - low sodium heart healthy    Complete by:  As directed    Discharge instructions    Complete by:  As directed    Please follow up with neurology regarding your recent stroke and hospitalization. They want to see you 6 weeks after discharge. Please follow up with your PCP or cardiologist regarding your hospitalization and new stroke to talk about medications you can take to lower your cholesterol.   Increase activity slowly    Complete by:  As directed       Signed: Rozann Lesches, MD 01/30/2017, 11:20 AM   Pager: [phone]

## 2017-01-30 NOTE — H&P (Signed)
Physical Medicine and Rehabilitation Admission H&P       Chief Complaint  Patient presents with  . Deficits in balance and ADLs due to stroke.     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 Patient with improving symptoms post admission. CTA  Head/neck recommended but family declined.  2D echo with EF 60-65% with grade one diastolic dysfunction, calcified mitral annulus and lipomatous atrial septal hypertrophy. Dr. Pearlean Brownie recommended ASA and Brilinta for stroke prophylaxis and BP goal 130-160 due to severe vascular stenosis.     Review of Systems  HENT: Positive for hearing loss.   Eyes: Positive for blurred vision (left eye depending on pressure).  Respiratory: Positive for shortness of breath (with certain activity). Negative for cough.   Cardiovascular: Negative for chest pain and palpitations.  Gastrointestinal: Positive for constipation. Negative for heartburn and nausea.  Genitourinary: Negative for dysuria and urgency.  Musculoskeletal: Positive for myalgias.  Skin: Negative for itching and rash.  Neurological: Positive for sensory change (issues with phantom pain and spasms RLE), speech change and focal weakness. Negative for dizziness and headaches.  Psychiatric/Behavioral: Negative for depression and memory loss. The patient is not nervous/anxious.         Past Medical History:  Diagnosis Date  . DDD (degenerative disc disease), cervical   . Diabetes  mellitus without complication ([name])   . Headache   . Hx of benign neoplasm of pituitary gland   . IBS (irritable colon syndrome)-C   . Mild persistent asthma   . PVD (peripheral vascular disease) ([name])   . Shingles   . Stroke [facility])          Past Surgical History:  Procedure Laterality Date  . ABOVE KNEE LEG AMPUTATION Right 2013  . APPENDECTOMY    . BYPASS GRAFT     ileofemoral   . PITUITARY EXCISION    . TONSILLECTOMY           Family History  Problem Relation Age of Onset  . Dementia Unknown   . Hypertension Daughter     Social History:  Divorced. Retired Engineer, civil (consulting). Lives with family ---Has CNA  4 hours twice a week and Adult day care 3 times a week. Has assistance  with bathing but independent for squat pivot transfers? at wheelchair (motorized or manual) level PTA. She reports that she quit smoking about 43 years ago. She has a 5.75 pack-year smoking history. She has never used smokeless tobacco. She reports that she does not drink alcohol or use drugs.        Allergies  Allergen Reactions  . Atacand Hct [Candesartan Cilexetil-Hctz] Other (See Comments)    Kidney disease  . Carbamazepine Other (See Comments)    Changes in ability to read, think clearly, unsteady gait  . Erythromycin Diarrhea  . Glucophage [Metformin Hcl] Other (See Comments)    Chronic low blood sugar   . Lactose Intolerance (Gi) Diarrhea and Nausea And Vomiting    Nausea & Vomiting; Diarrhea  . Lipitor [Atorvastatin] Other (See Comments)    Muscle weakness  . Nsaids Nausea  And Vomiting    Motrin, Naprosyn, Voltaren, Celebrex, Prevacid, Mobic -- abdominal pain and burning, cramps, loose stool  . Penicillins Shortness Of Breath and Rash    Has patient had a PCN reaction causing immediate rash, facial/tongue/throat swelling, SOB or lightheadedness with hypotension: Yes Has patient had a PCN reaction causing severe rash involving mucus membranes or skin  necrosis: No Has patient had a PCN reaction that required hospitalization: No Has patient had a PCN reaction occurring within the last 10 years: No If all of the above answers are "NO", then may proceed with Cephalosporin use.  . Sulfa Antibiotics Anaphylaxis  . Ceclor [Cefaclor] Hives  . Clindamycin/Lincomycin Other (See Comments)    Abdominal pain, loss of appetite  . Rosuvastatin Calcium Other (See Comments)    Lethargy, malaise  . Dilaudid [Hydromorphone Hcl] Rash  . Plavix [Clopidogrel] Rash and Other (See Comments)    Skin peeled, hair fell out          Medications Prior to Admission  Medication Sig Dispense Refill  . acetaminophen (TYLENOL) 500 MG tablet Take 500-1,000 mg by mouth every 6 (six) hours as needed (pain).    Marland Kitchen amLODipine (NORVASC) 2.5 MG tablet Take 2.5 mg by mouth daily with supper.     Marland Kitchen aspirin EC 81 MG EC tablet Take 1 tablet (81 mg total) by mouth daily. 90 tablet 2  . Biotin 5 MG TABS Take 5 mg by mouth daily with supper.     . budesonide (PULMICORT) 0.25 MG/2ML nebulizer solution Take 0.25 mg by nebulization See admin instructions. Inhale 1 vial (0.25 mg) via nebulization every other night    . Cholecalciferol (VITAMIN D) 2000 units CAPS Take 2,000 Units by mouth daily.    Marland Kitchen dipyridamole-aspirin (AGGRENOX) 200-25 MG 12hr capsule Take once a day at bedtime; after 08/11/16 please take twice a day (Patient taking differently: Take 1 capsule by mouth 2 (two) times daily. ) 90 capsule 2  . docusate sodium (COLACE) 100 MG capsule Take 200 mg by mouth 2 (two) times daily.     . DULoxetine (CYMBALTA) 30 MG capsule Take 30 mg by mouth daily.    . Flaxseed, Linseed, (FLAXSEED OIL) 1000 MG CAPS Take 1,000 mg by mouth 3 (three) times daily.    . fluticasone (FLONASE) 50 MCG/ACT nasal spray Place 1 spray into both nostrils daily as needed for allergies or rhinitis.    Marland Kitchen gabapentin (NEURONTIN) 100 MG capsule Take 1 capsule (100 mg total) by mouth  every morning. (Patient taking differently: Take 100-200 mg by mouth See admin instructions. Take 1 capsule (100 mg) by mouth every morning and 2 capsules (200 mg) at night) 30 capsule 2  . latanoprost (XALATAN) 0.005 % ophthalmic solution Place 1 drop into both eyes at bedtime.    . levalbuterol (XOPENEX HFA) 45 MCG/ACT inhaler Inhale 2 puffs into the lungs every 4 (four) hours as needed for wheezing.    Marland Kitchen loratadine (CLARITIN) 10 MG tablet Take 10 mg by mouth daily.     . Multiple Vitamin (MULTIVITAMIN WITH MINERALS) TABS tablet Take 1 tablet by mouth daily.    . sodium chloride (OCEAN) 0.65 % SOLN nasal spray Place 1 spray into both nostrils daily as needed for congestion.    . Tetrahydrozoline HCl (VISINE OP) Place 1 drop into both eyes 2 (two) times daily as needed (dry eyes).    . traMADol (ULTRAM) 50 MG tablet Take 50 mg by mouth 2 (two) times daily as needed (pain).    Marland Kitchen  gabapentin (NEURONTIN) 100 MG capsule Take 2 capsules (200 mg total) by mouth at bedtime. (Patient not taking: Reported on 01/26/2017) 90 capsule 2    Home: Home Living Family/patient expects to be discharged to:: Private residence Living Arrangements: Children Available Help at Discharge: Personal care attendant, Available PRN/intermittently, Family Type of Home: Apartment Home Access: Ramped entrance Home Layout: One level Bathroom Shower/Tub: Tub/shower unit, Engineer, building services: Standard Bathroom Accessibility: Yes (has limited help due to small BR area, wc and pt only) Home Equipment: Bedside commode, Tub bench, Grab bars - toilet, Grab bars - tub/shower, Wheelchair - power, Hospital bed, Other (comment)  Lives With: Daughter   Functional History: Prior Function Level of Independence: Needs assistance Gait / Transfers Assistance Needed: wc walker; performing transfers independently ADL's / Homemaking Assistance Needed: independent in toileting, supervision/min A for bathing; assisted by  attendant and daughter  Functional Status:  Mobility: Bed Mobility Overal bed mobility: Needs Assistance Bed Mobility: Supine to Sit, Sit to Supine Supine to sit: Mod assist Sit to supine: Mod assist General bed mobility comments: up in [facility] when PT arrived Transfers Overall transfer level: Needs assistance Equipment used: 1 person hand held assist Transfers: Sit to/from Stand, Stand Pivot Transfers Sit to Stand: Min guard Stand pivot transfers: Min assist General transfer comment: pt is very good with minimal cues for hand placement, able to find a sequence wiht her quickly Ambulation/Gait General Gait Details: unable Wheelchair Mobility Wheelchair mobility: Yes Wheelchair propulsion: Both upper extremities Wheelchair parts: Supervision/cueing Distance: 15 Wheelchair Assistance Details (indicate cue type and reason): round trip to BR to practice BSC transfers  ADL: ADL Overall ADL's : Needs assistance/impaired Eating/Feeding: Set up, With adaptive utensils, Sitting Eating/Feeding Details (indicate cue type and reason): able to eat dinner with food cut up for her and red built up handles on utensils - Pt using RUE to self- feed and encouraged to continue Grooming: Brushing hair, Sitting, Minimal assistance Grooming Details (indicate cue type and reason): Able to brush her hair with assistance for thoroughness this session. Improved ability to manipulate comb.  Upper Body Bathing: Moderate assistance, Sitting Lower Body Bathing: Moderate assistance, With adaptive equipment, Sitting/lateral leans Upper Body Dressing : Moderate assistance, Sitting Lower Body Dressing: Maximal assistance, Sit to/from stand Toilet Transfer: Moderate assistance, Stand-pivot, BSC Toilet Transfer Details (indicate cue type and reason): face to face no DME for transfer to the left Toileting- Clothing Manipulation and Hygiene: Moderate assistance, Sit to/from stand Tub/ Shower Transfer: Moderate  assistance, Stand-pivot, Tub bench Functional mobility during ADLs: Wheelchair General ADL Comments: Session focused on seated grooming tasks as well as improved fine motor coordination and hand strength in preparation for preferred leisure occupation of crocheting. Pt demonstrating improved ability to manipulate putty this session. Facilitated improved fine motor coordination with rotation tasks with R hand utilizing pen with built-up handle. Pt better able to complete simple writing tasks and was able to legibly write her name.   Cognition: Cognition Overall Cognitive Status: History of cognitive impairments - at baseline Arousal/Alertness: Awake/alert Orientation Level: Oriented X4 Attention: Sustained Sustained Attention: Appears intact Memory: Impaired Memory Impairment: Decreased short term memory, Decreased recall of new information Decreased Short Term Memory: Verbal basic, Functional basic Awareness: Appears intact Safety/Judgment: Appears intact Cognition Arousal/Alertness: Awake/alert Behavior During Therapy: WFL for tasks assessed/performed Overall Cognitive Status: History of cognitive impairments - at baseline General Comments: pt is a participant in adult day care program   Blood pressure 135/67, pulse 80, temperature (!) 97.5 F (36.4  C), temperature source Oral, resp. rate 18, height 5\' 4"  (1.626 m), weight 61.3 kg (135 lb 1.6 oz), SpO2 98 %. Physical Exam  Nursing note and vitals reviewed. Constitutional: She is oriented to person, place, and time. She appears well-developed and well-nourished.  HENT:  Head: Normocephalic and atraumatic.  Mouth/Throat: Oropharynx is clear and moist.  Eyes: Pupils are equal, round, and reactive to light. Conjunctivae and EOM are normal.  Neck: Normal range of motion. Neck supple.  Cardiovascular: Normal rate and regular rhythm.   Respiratory: Effort normal and breath sounds normal. No stridor.  GI: Soft. Bowel sounds are normal.  She exhibits no distension. There is no tenderness.  Musculoskeletal: She exhibits no edema or tenderness.  Neurological: She is alert and oriented to person, place, and time.  Decreased hearing. Minimal dysarthria. Able to follow basic commands without difficulty. Reasonable insight and awareness. Mild right central 7. No tongue deviation. No abnormalities with confrontation testing. RUE: 3+ to 4/5 deltoid, biceps, triceps, wrist, HI. Right HF 3+. LUE and LLE grossly 4 to 4+/5. No sensory abnormalities bilaterally. DTR's 1+  Skin: Skin is warm and dry.  Psychiatric: She has a normal mood and affect. Her behavior is normal. Thought content normal.    Lab Results Last 48 Hours       Results for orders placed or performed during the hospital encounter of 01/26/17 (from the past 48 hour(s))  Glucose, capillary     Status: Abnormal   Collection Time: 01/28/17  4:19 PM  Result Value Ref Range   Glucose-Capillary 109 (H) 65 - 99 mg/dL  Glucose, capillary     Status: Abnormal   Collection Time: 01/29/17  7:40 AM  Result Value Ref Range   Glucose-Capillary 122 (H) 65 - 99 mg/dL     Imaging Results (Last 48 hours)  No results found.       Medical Problem List and Plan: 1.  Right hemiparesis secondary to left pontine infarct. Hx of right AKA             -admit to inpatient rehab             -pt deemed not to be a prosthetic candidate due to her chronic low back pain/age 77.  DVT Prophylaxis/Anticoagulation: Pharmaceutical: Lovenox 3. Chronic HA/back pain/Pain Management: cymbalta--unable to tolerate higher doses.  4. Mood: LCSW to follow for evaluation and support.  5. Neuropsych: This patient is capable of making decisions on her own behalf. 6. Skin/Wound Care: routine pressure relief measures. Maintain adequate nutritional and hydration status.  7. Fluids/Electrolytes/Nutrition: Monitor I/O. Check lytes in am.  8. T2DM: Monitor BS ac/hs. 9. HTN: Monitor BP bid--goal 130-160  to avoid hypoperfusion due to severe ICA stenosis. 10. CKD: Monitor intake-- avoid nephrotoxic medications.  11. Peripheral neuropathy/phantom pain: On gabapentin and duloxetine.              -consider gabapentin titration 12. IBS-Chronic constipation: Change miralax (causing bloating/diarrhea) to Senna S.      Post Admission Physician Evaluation: 1. Functional deficits secondary  to left pontine infarct. 2. Patient is admitted to receive collaborative, interdisciplinary care between the physiatrist, rehab nursing staff, and therapy team. 3. Patient's level of medical complexity and substantial therapy needs in context of that medical necessity cannot be provided at a lesser intensity of care such as a SNF. 4. Patient has experienced substantial functional loss from his/her baseline which was documented above under the "Functional History" and "Functional Status" headings.  Judging by the  patient's diagnosis, physical exam, and functional history, the patient has potential for functional progress which will result in measurable gains while on inpatient rehab.  These gains will be of substantial and practical use upon discharge  in facilitating mobility and self-care at the household level. 5. Physiatrist will provide 24 hour management of medical needs as well as oversight of the therapy plan/treatment and provide guidance as appropriate regarding the interaction of the two. 6. The Preadmission Screening has been reviewed and patient status is unchanged unless otherwise stated above. 7. 24 hour rehab nursing will assist with bladder management, bowel management, safety, skin/wound care, disease management, medication administration, pain management and patient education  and help integrate therapy concepts, techniques,education, etc. 8. PT will assess and treat for/with: Lower extremity strength, range of motion, stamina, balance, functional mobility, safety, adaptive techniques and equipment,  NMR, w/c mobility, family education.   Goals are: mod I to supervision at w/c level. 9. OT will assess and treat for/with: ADL's, functional mobility, safety, upper extremity strength, adaptive techniques and equipment, NMR, w/c utilization, family ed.   Goals are: supervision to mod I. Therapy may proceed with showering this patient. 10. SLP will assess and treat for/with: cognition, communication.  Goals are: mod I. 11. Case Management and Social Worker will assess and treat for psychological issues and discharge planning. 12. Team conference will be held weekly to assess progress toward goals and to determine barriers to discharge. 13. Patient will receive at least 3 hours of therapy per day at least 5 days per week. 14. ELOS: 7-11 days       15. Prognosis:  excellent     Ranelle Oyster, MD, [facility] Physical Medicine & Rehabilitation 01/30/2017  Jacquelynn Cree, [place] 01/30/2017

## 2017-01-30 NOTE — H&P (Signed)
Physical Medicine and Rehabilitation Admission H&P    Chief Complaint  Patient presents with  . Deficits in balance and ADLs due to stroke.     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 Patient with improving symptoms post admission. CTA  Head/neck recommended but family declined.  2D echo with EF 60-65% with grade one diastolic dysfunction, calcified mitral annulus and lipomatous atrial septal hypertrophy. Dr. Pearlean Brownie recommended ASA and Brilinta for stroke prophylaxis and BP goal 130-160 due to severe vascular stenosis.     Review of Systems  HENT: Positive for hearing loss.   Eyes: Positive for blurred vision (left eye depending on pressure).  Respiratory: Positive for shortness of breath (with certain activity). Negative for cough.   Cardiovascular: Negative for chest pain and palpitations.  Gastrointestinal: Positive for constipation. Negative for heartburn and nausea.  Genitourinary: Negative for dysuria and urgency.  Musculoskeletal: Positive for myalgias.  Skin: Negative for itching and rash.  Neurological: Positive for sensory change (issues with phantom pain and spasms RLE), speech change and focal weakness. Negative for dizziness and headaches.  Psychiatric/Behavioral: Negative for depression and memory loss. The patient is not nervous/anxious.     Past Medical History:  Diagnosis Date  . DDD (degenerative disc disease), cervical   . Diabetes mellitus without  complication ([name])   . Headache   . Hx of benign neoplasm of pituitary gland   . IBS (irritable colon syndrome)-C   . Mild persistent asthma   . PVD (peripheral vascular disease) ([name])   . Shingles   . Stroke [facility])     Past Surgical History:  Procedure Laterality Date  . ABOVE KNEE LEG AMPUTATION Right 2013  . APPENDECTOMY    . BYPASS GRAFT     ileofemoral   . PITUITARY EXCISION    . TONSILLECTOMY      Family History  Problem Relation Age of Onset  . Dementia Unknown   . Hypertension Daughter     Social History:  Divorced. Retired Engineer, civil (consulting). Lives with family ---Has CNA  4 hours twice a week and Adult day care 3 times a week. Has assistance  with bathing but independent for squat pivot transfers? at wheelchair (motorized or manual) level PTA. She reports that she quit smoking about 43 years ago. She has a 5.75 pack-year smoking history. She has never used smokeless tobacco. She reports that she does not drink alcohol or use drugs.   Allergies  Allergen Reactions  . Atacand Hct [Candesartan Cilexetil-Hctz] Other (See Comments)    Kidney disease  . Carbamazepine Other (See Comments)    Changes in ability to read, think clearly, unsteady gait  . Erythromycin Diarrhea  . Glucophage [Metformin Hcl] Other (See Comments)    Chronic low blood sugar   . Lactose Intolerance (Gi) Diarrhea and Nausea And Vomiting    Nausea & Vomiting; Diarrhea  . Lipitor [Atorvastatin] Other (See Comments)    Muscle weakness  . Nsaids Nausea And Vomiting    Motrin, Naprosyn, Voltaren, Celebrex, Prevacid, Mobic -- abdominal pain and burning, cramps, loose stool  .  Penicillins Shortness Of Breath and Rash    Has patient had a PCN reaction causing immediate rash, facial/tongue/throat swelling, SOB or lightheadedness with hypotension: Yes Has patient had a PCN reaction causing severe rash involving mucus membranes or skin necrosis: No Has patient had a PCN reaction that required hospitalization: No Has  patient had a PCN reaction occurring within the last 10 years: No If all of the above answers are "NO", then may proceed with Cephalosporin use.  . Sulfa Antibiotics Anaphylaxis  . Ceclor [Cefaclor] Hives  . Clindamycin/Lincomycin Other (See Comments)    Abdominal pain, loss of appetite  . Rosuvastatin Calcium Other (See Comments)    Lethargy, malaise  . Dilaudid [Hydromorphone Hcl] Rash  . Plavix [Clopidogrel] Rash and Other (See Comments)    Skin peeled, hair fell out    Medications Prior to Admission  Medication Sig Dispense Refill  . acetaminophen (TYLENOL) 500 MG tablet Take 500-1,000 mg by mouth every 6 (six) hours as needed (pain).    Marland Kitchen amLODipine (NORVASC) 2.5 MG tablet Take 2.5 mg by mouth daily with supper.     Marland Kitchen aspirin EC 81 MG EC tablet Take 1 tablet (81 mg total) by mouth daily. 90 tablet 2  . Biotin 5 MG TABS Take 5 mg by mouth daily with supper.     . budesonide (PULMICORT) 0.25 MG/2ML nebulizer solution Take 0.25 mg by nebulization See admin instructions. Inhale 1 vial (0.25 mg) via nebulization every other night    . Cholecalciferol (VITAMIN D) 2000 units CAPS Take 2,000 Units by mouth daily.    Marland Kitchen dipyridamole-aspirin (AGGRENOX) 200-25 MG 12hr capsule Take once a day at bedtime; after 08/11/16 please take twice a day (Patient taking differently: Take 1 capsule by mouth 2 (two) times daily. ) 90 capsule 2  . docusate sodium (COLACE) 100 MG capsule Take 200 mg by mouth 2 (two) times daily.     . DULoxetine (CYMBALTA) 30 MG capsule Take 30 mg by mouth daily.    . Flaxseed, Linseed, (FLAXSEED OIL) 1000 MG CAPS Take 1,000 mg by mouth 3 (three) times daily.    . fluticasone (FLONASE) 50 MCG/ACT nasal spray Place 1 spray into both nostrils daily as needed for allergies or rhinitis.    Marland Kitchen gabapentin (NEURONTIN) 100 MG capsule Take 1 capsule (100 mg total) by mouth every morning. (Patient taking differently: Take 100-200 mg by mouth See admin instructions. Take 1 capsule (100 mg) by  mouth every morning and 2 capsules (200 mg) at night) 30 capsule 2  . latanoprost (XALATAN) 0.005 % ophthalmic solution Place 1 drop into both eyes at bedtime.    . levalbuterol (XOPENEX HFA) 45 MCG/ACT inhaler Inhale 2 puffs into the lungs every 4 (four) hours as needed for wheezing.    Marland Kitchen loratadine (CLARITIN) 10 MG tablet Take 10 mg by mouth daily.     . Multiple Vitamin (MULTIVITAMIN WITH MINERALS) TABS tablet Take 1 tablet by mouth daily.    . sodium chloride (OCEAN) 0.65 % SOLN nasal spray Place 1 spray into both nostrils daily as needed for congestion.    . Tetrahydrozoline HCl (VISINE OP) Place 1 drop into both eyes 2 (two) times daily as needed (dry eyes).    . traMADol (ULTRAM) 50 MG tablet Take 50 mg by mouth 2 (two) times daily as needed (pain).    Marland Kitchen gabapentin (NEURONTIN) 100 MG capsule Take 2 capsules (200 mg total) by mouth at bedtime. (Patient not taking: Reported on 01/26/2017) 90 capsule  2    Home: Home Living Family/patient expects to be discharged to:: Private residence Living Arrangements: Children Available Help at Discharge: Personal care attendant, Available PRN/intermittently, Family Type of Home: Apartment Home Access: Ramped entrance Home Layout: One level Bathroom Shower/Tub: Tub/shower unit, Engineer, building services: Standard Bathroom Accessibility: Yes (has limited help due to small BR area, wc and pt only) Home Equipment: Bedside commode, Tub bench, Grab bars - toilet, Grab bars - tub/shower, Wheelchair - power, Hospital bed, Other (comment)  Lives With: Daughter   Functional History: Prior Function Level of Independence: Needs assistance Gait / Transfers Assistance Needed: wc walker; performing transfers independently ADL's / Homemaking Assistance Needed: independent in toileting, supervision/min A for bathing; assisted by attendant and daughter  Functional Status:  Mobility: Bed Mobility Overal bed mobility: Needs Assistance Bed Mobility: Supine to  Sit, Sit to Supine Supine to sit: Mod assist Sit to supine: Mod assist General bed mobility comments: up in [facility] when PT arrived Transfers Overall transfer level: Needs assistance Equipment used: 1 person hand held assist Transfers: Sit to/from Stand, Stand Pivot Transfers Sit to Stand: Min guard Stand pivot transfers: Min assist General transfer comment: pt is very good with minimal cues for hand placement, able to find a sequence wiht her quickly Ambulation/Gait General Gait Details: unable Wheelchair Mobility Wheelchair mobility: Yes Wheelchair propulsion: Both upper extremities Wheelchair parts: Supervision/cueing Distance: 15 Wheelchair Assistance Details (indicate cue type and reason): round trip to BR to practice BSC transfers  ADL: ADL Overall ADL's : Needs assistance/impaired Eating/Feeding: Set up, With adaptive utensils, Sitting Eating/Feeding Details (indicate cue type and reason): able to eat dinner with food cut up for her and red built up handles on utensils - Pt using RUE to self- feed and encouraged to continue Grooming: Brushing hair, Sitting, Minimal assistance Grooming Details (indicate cue type and reason): Able to brush her hair with assistance for thoroughness this session. Improved ability to manipulate comb.  Upper Body Bathing: Moderate assistance, Sitting Lower Body Bathing: Moderate assistance, With adaptive equipment, Sitting/lateral leans Upper Body Dressing : Moderate assistance, Sitting Lower Body Dressing: Maximal assistance, Sit to/from stand Toilet Transfer: Moderate assistance, Stand-pivot, BSC Toilet Transfer Details (indicate cue type and reason): face to face no DME for transfer to the left Toileting- Clothing Manipulation and Hygiene: Moderate assistance, Sit to/from stand Tub/ Shower Transfer: Moderate assistance, Stand-pivot, Tub bench Functional mobility during ADLs: Wheelchair General ADL Comments: Session focused on seated grooming tasks  as well as improved fine motor coordination and hand strength in preparation for preferred leisure occupation of crocheting. Pt demonstrating improved ability to manipulate putty this session. Facilitated improved fine motor coordination with rotation tasks with R hand utilizing pen with built-up handle. Pt better able to complete simple writing tasks and was able to legibly write her name.   Cognition: Cognition Overall Cognitive Status: History of cognitive impairments - at baseline Arousal/Alertness: Awake/alert Orientation Level: Oriented X4 Attention: Sustained Sustained Attention: Appears intact Memory: Impaired Memory Impairment: Decreased short term memory, Decreased recall of new information Decreased Short Term Memory: Verbal basic, Functional basic Awareness: Appears intact Safety/Judgment: Appears intact Cognition Arousal/Alertness: Awake/alert Behavior During Therapy: WFL for tasks assessed/performed Overall Cognitive Status: History of cognitive impairments - at baseline General Comments: pt is a participant in adult day care program   Blood pressure 135/67, pulse 80, temperature (!) 97.5 F (36.4 C), temperature source Oral, resp. rate 18, height 5\' 4"  (1.626 m), weight 61.3 kg (135 lb 1.6 oz), SpO2 98 %. Physical  Exam  Nursing note and vitals reviewed. Constitutional: She is oriented to person, place, and time. She appears well-developed and well-nourished.  HENT:  Head: Normocephalic and atraumatic.  Mouth/Throat: Oropharynx is clear and moist.  Eyes: Pupils are equal, round, and reactive to light. Conjunctivae and EOM are normal.  Neck: Normal range of motion. Neck supple.  Cardiovascular: Normal rate and regular rhythm.   Respiratory: Effort normal and breath sounds normal. No stridor.  GI: Soft. Bowel sounds are normal. She exhibits no distension. There is no tenderness.  Musculoskeletal: She exhibits no edema or tenderness.  Neurological: She is alert and  oriented to person, place, and time.  Decreased hearing. Minimal dysarthria. Able to follow basic commands without difficulty. Reasonable insight and awareness. Mild right central 7. No tongue deviation. No abnormalities with confrontation testing. RUE: 3+ to 4/5 deltoid, biceps, triceps, wrist, HI. Right HF 3+. LUE and LLE grossly 4 to 4+/5. No sensory abnormalities bilaterally. DTR's 1+  Skin: Skin is warm and dry.  Psychiatric: She has a normal mood and affect. Her behavior is normal. Thought content normal.    Results for orders placed or performed during the hospital encounter of 01/26/17 (from the past 48 hour(s))  Glucose, capillary     Status: Abnormal   Collection Time: 01/28/17  4:19 PM  Result Value Ref Range   Glucose-Capillary 109 (H) 65 - 99 mg/dL  Glucose, capillary     Status: Abnormal   Collection Time: 01/29/17  7:40 AM  Result Value Ref Range   Glucose-Capillary 122 (H) 65 - 99 mg/dL   No results found.     Medical Problem List and Plan: 1.  Right hemiparesis secondary to left pontine infarct. Hx of right AKA  -admit to inpatient rehab  -pt deemed not to be a prosthetic candidate due to her chronic low back pain/age 83.  DVT Prophylaxis/Anticoagulation: Pharmaceutical: Lovenox 3. Chronic HA/back pain/Pain Management: cymbalta--unable to tolerate higher doses.  4. Mood: LCSW to follow for evaluation and support.  5. Neuropsych: This patient is capable of making decisions on her own behalf. 6. Skin/Wound Care: routine pressure relief measures. Maintain adequate nutritional and hydration status.  7. Fluids/Electrolytes/Nutrition: Monitor I/O. Check lytes in am.  8. T2DM: Monitor BS ac/hs. 9. HTN: Monitor BP bid--goal 130-160 to avoid hypoperfusion due to severe ICA stenosis. 10. CKD: Monitor intake-- avoid nephrotoxic medications.  11. Peripheral neuropathy/phantom pain: On gabapentin and duloxetine.   -consider gabapentin titration 12. IBS-Chronic constipation:  Change miralax (causing bloating/diarrhea) to Senna S.      Post Admission Physician Evaluation: 1. Functional deficits secondary  to left pontine infarct. 2. Patient is admitted to receive collaborative, interdisciplinary care between the physiatrist, rehab nursing staff, and therapy team. 3. Patient's level of medical complexity and substantial therapy needs in context of that medical necessity cannot be provided at a lesser intensity of care such as a SNF. 4. Patient has experienced substantial functional loss from his/her baseline which was documented above under the "Functional History" and "Functional Status" headings.  Judging by the patient's diagnosis, physical exam, and functional history, the patient has potential for functional progress which will result in measurable gains while on inpatient rehab.  These gains will be of substantial and practical use upon discharge  in facilitating mobility and self-care at the household level. 5. Physiatrist will provide 24 hour management of medical needs as well as oversight of the therapy plan/treatment and provide guidance as appropriate regarding the interaction of the two. 6. The  Preadmission Screening has been reviewed and patient status is unchanged unless otherwise stated above. 7. 24 hour rehab nursing will assist with bladder management, bowel management, safety, skin/wound care, disease management, medication administration, pain management and patient education  and help integrate therapy concepts, techniques,education, etc. 8. PT will assess and treat for/with: Lower extremity strength, range of motion, stamina, balance, functional mobility, safety, adaptive techniques and equipment, NMR, w/c mobility, family education.   Goals are: mod I to supervision at w/c level. 9. OT will assess and treat for/with: ADL's, functional mobility, safety, upper extremity strength, adaptive techniques and equipment, NMR, w/c utilization, family ed.   Goals  are: supervision to mod I. Therapy may proceed with showering this patient. 10. SLP will assess and treat for/with: cognition, communication.  Goals are: mod I. 11. Case Management and Social Worker will assess and treat for psychological issues and discharge planning. 12. Team conference will be held weekly to assess progress toward goals and to determine barriers to discharge. 13. Patient will receive at least 3 hours of therapy per day at least 5 days per week. 14. ELOS: 7-11 days       15. Prognosis:  excellent     Ranelle Oyster, MD, [facility] Physical Medicine & Rehabilitation 01/30/2017  Jacquelynn Cree, [place] 01/30/2017

## 2017-01-30 NOTE — Progress Notes (Signed)
[name] Re[facility] Admission Coordinator Signed Physical Medicine and Rehabilitation  PMR Pre-admission Date of Service: 01/30/2017 10:13 AM  Related encounter: ED to Hosp-Admission (Current) from 01/26/2017 in [facility] 8M NEURO MEDICAL       [] Hide copied text PMR Admission Coordinator Pre-Admission Assessment  Patient: Carolyn Werner is an 83 y.o., female MRN: [id number] DOB: 08/11/1921 Height: 5\' 4"  (162.6 cm) Weight: 61.3 kg (135 lb 1.6 oz)                                                                                                                                                  Insurance Information HMO:     PPO:      PCP:      IPA:      80/20:      OTHER:  PRIMARY: Medicare A & B      Policy#: [id number]      Subscriber: Self  CM Name:       Phone#:      Fax#:  Pre-Cert#: Eligible     Employer: Retired  Benefits:  Phone #: Verified online     Name: Passport One Eff. Date: 4/1/11990     Deduct: $1340      Out of Pocket Max: N/A      Life Max: N/A CIR: 100%      SNF: 100% days 1-20; 80% days 21-100 Outpatient: 80%     Co-Pay: 20% Home Health: 100%      Co-Pay: None DME: 80%     Co-Pay: 20% Providers: Patient's Choice   SECONDARY: [employer] Employee PPO      Policy#: [id number]      Subscriber: Self CM Name:       Phone#:      Fax#:  Pre-Cert#:       Employer: Retired  Benefits:  Phone #:      Name:  Eff. Date:      Deduct:       Out of Pocket Max:       Life Max:  CIR:       SNF:  Outpatient:      Co-Pay:  Home Health:       Co-Pay:  DME:      Co-Pay:   Medicaid Application Date:       Case Manager:  Disability Application Date:       Case Worker:   Emergency Contact Information        Contact Information    Name Relation Home Work Mobile   Addo,Kathryn Daughter [phone]     Devoria Glassing   [phone]     Current Medical History  Patient Admitting Diagnosis: Left paramedian pons infarct  History of Present  Illness: Carolyn Washingtonis a 83 y.o. right handed femalewith history of T2DM with peripheral neuropathy, CKD-baseline SCr-1.3, R-AKA and  wheelchair bound, asthma, chronic back pain/HA, bilateral cerebellar/pontine strokes 07/2016 due to high grade L-ICA stenosis; who was admitted on 01/26/17 with increasing right sided weakness and slurred speech when getting up from sleep. MRI/MRA head done revealing acute infarct in left paramedian pons with concern for acute PCA thrombosis and significant flow limiting stenosis left supraclinoid and non diagnostic neck--limited exam. Patient with improving symptoms post admission. CTA head/neck recommended but family declined. 2D echo with EF 60-65% with grade one diastolic dysfunction, calcified mitral annulus and lipomatous atrial septal hypertrophy. Dr. Pearlean Brownie recommended ASA and Brilinta for stroke prophylaxis and BP goal 130-160 due to severe vascular stenosis.   NIH Total: 0  Past Medical History      Past Medical History:  Diagnosis Date  . DDD (degenerative disc disease), cervical   . Diabetes mellitus without complication ([name])   . Headache   . Hx of benign neoplasm of pituitary gland   . IBS (irritable colon syndrome)-C   . Mild persistent asthma   . PVD (peripheral vascular disease) ([name])   . Shingles   . Stroke [facility])     Family History  family history includes Dementia in her unknown relative; Hypertension in her daughter.  Prior Rehab/Hospitalizations:  Has the patient had major surgery during 100 days prior to admission? No  Current Medications   Current Facility-Administered Medications:  .  acetaminophen (TYLENOL) tablet 650 mg, 650 mg, Oral, Q4H PRN, 650 mg at 01/30/17 0543 **OR** acetaminophen (TYLENOL) solution 650 mg, 650 mg, Per Tube, Q4H PRN **OR** acetaminophen (TYLENOL) suppository 650 mg, 650 mg, Rectal, Q4H PRN, Reymundo Poll, MD .  aspirin chewable tablet 81 mg, 81 mg, Oral, Daily, Paulette Blanch,  PA-C, 81 mg at 01/30/17 0936 .  budesonide (PULMICORT) nebulizer solution 0.25 mg, 0.25 mg, Nebulization, Q48H, Darreld Mclean, MD, 0.25 mg at 01/29/17 2049 .  cholecalciferol (VITAMIN D) tablet 2,000 Units, 2,000 Units, Oral, Daily, Reymundo Poll, MD, 2,000 Units at 01/30/17 [phone] .  docusate sodium (COLACE) capsule 200 mg, 200 mg, Oral, BID, Reymundo Poll, MD, 200 mg at 01/30/17 0935 .  DULoxetine (CYMBALTA) DR capsule 30 mg, 30 mg, Oral, Daily, Reymundo Poll, MD, 30 mg at 01/30/17 0936 .  enoxaparin (LOVENOX) injection 30 mg, 30 mg, Subcutaneous, QHS, Tyson Alias, MD, 30 mg at 01/29/17 2135 .  fluticasone (FLONASE) 50 MCG/ACT nasal spray 2 spray, 2 spray, Each Nare, Daily PRN, Reymundo Poll, MD .  gabapentin (NEURONTIN) capsule 100 mg, 100 mg, Oral, Daily, 100 mg at 01/30/17 0935 **AND** gabapentin (NEURONTIN) capsule 200 mg, 200 mg, Oral, QHS, Tyson Alias, MD, 200 mg at 01/29/17 2134 .  latanoprost (XALATAN) 0.005 % ophthalmic solution 1 drop, 1 drop, Both Eyes, QHS, Guilloud, Eber Jones, MD, 1 drop at 01/29/17 2135 .  levalbuterol (XOPENEX) nebulizer solution 0.63 mg, 0.63 mg, Nebulization, Q6H PRN, Tyson Alias, MD .  loratadine (CLARITIN) tablet 10 mg, 10 mg, Oral, Daily, Reymundo Poll, MD, 10 mg at 01/30/17 1191 .  multivitamin with minerals tablet 1 tablet, 1 tablet, Oral, Daily, Reymundo Poll, MD, 1 tablet at 01/30/17 0935 .  polyethylene glycol (MIRALAX / GLYCOLAX) packet 17 g, 17 g, Oral, Daily PRN, Reymundo Poll, MD .  polyethylene glycol (MIRALAX / GLYCOLAX) packet 17 g, 17 g, Oral, Daily, Tyson Alias, MD, 17 g at 01/29/17 0916 .  sodium chloride (OCEAN) 0.65 % nasal spray 1 spray, 1 spray, Each Nare, Daily PRN, Reymundo Poll, MD .  tetrahydrozoline 0.05 % ophthalmic solution 1  drop, 1 drop, Both Eyes, BID PRN, Reymundo Poll, MD .  ticagrelor [facility]) tablet 90 mg, 90 mg, Oral, BID, Micki Riley, MD, 90 mg at  01/30/17 0935  Patients Current Diet: Diet Heart Room service appropriate? Yes; Fluid consistency: Thin  Precautions / Restrictions Precautions Precautions: Fall Precaution Comments: R AKA Restrictions Weight Bearing Restrictions: No Other Position/Activity Restrictions: R AKA with phantom pain and L Knee and leg very sensitive from PVD   Has the patient had 2 or more falls or a fall with injury in the past year?No  Prior Activity Level Limited Community (1-2x/wk): Prior to admission patient was out in the community twice a week to go to daycare and would go out with her daughter to go to medical appointments.   Home Assistive Devices / Equipment Home Assistive Devices/Equipment: Bedside commode/3-in-1, Eyeglasses, Shower chair with back Home Equipment: Bedside commode, Tub bench, Grab bars - toilet, Grab bars - tub/shower, Wheelchair - power, Hospital bed, Other (comment)  Prior Device Use: Indicate devices/aids used by the patient prior to current illness, exacerbation or injury? Manual wheelchair  Prior Functional Level Prior Function Level of Independence: Needs assistance Gait / Transfers Assistance Needed: wc walker; performing transfers independently ADL's / Homemaking Assistance Needed: independent in toileting, supervision/min A for bathing; assisted by attendant and daughter  Self Care: Did the patient need help bathing, dressing, using the toilet or eating? Independent  Indoor Mobility: Did the patient need assistance with walking from room to room (with or without device)? Independent  Stairs: Did the patient need assistance with internal or external stairs (with or without device)? Dependent  Functional Cognition: Did the patient need help planning regular tasks such as shopping or remembering to take medications? Needed some help  Current Functional Level Cognition  Arousal/Alertness: Awake/alert Overall Cognitive Status: History of cognitive  impairments - at baseline Orientation Level: Oriented X4 General Comments: pt is a participant in adult day care program Attention: Sustained Sustained Attention: Appears intact Memory: Impaired Memory Impairment: Decreased short term memory, Decreased recall of new information Decreased Short Term Memory: Verbal basic, Functional basic Awareness: Appears intact Safety/Judgment: Appears intact    Extremity Assessment (includes Sensation/Coordination)  Upper Extremity Assessment: RUE deficits/detail RUE Deficits / Details: decreased grip strength, noted ataxic movement in RUE when reaching for objects grossly 3+/5 RUE Sensation:  [facility]) RUE Coordination: decreased fine motor, decreased gross motor  Lower Extremity Assessment: Defer to PT evaluation    ADLs  Overall ADL's : Needs assistance/impaired Eating/Feeding: Set up, With adaptive utensils, Sitting Eating/Feeding Details (indicate cue type and reason): able to eat dinner with food cut up for her and red built up handles on utensils - Pt using RUE to self- feed and encouraged to continue Grooming: Brushing hair, Sitting, Minimal assistance Grooming Details (indicate cue type and reason): Able to brush her hair with assistance for thoroughness this session. Improved ability to manipulate comb.  Upper Body Bathing: Moderate assistance, Sitting Lower Body Bathing: Moderate assistance, With adaptive equipment, Sitting/lateral leans Upper Body Dressing : Moderate assistance, Sitting Lower Body Dressing: Maximal assistance, Sit to/from stand Toilet Transfer: Moderate assistance, Stand-pivot, BSC Toilet Transfer Details (indicate cue type and reason): face to face no DME for transfer to the left Toileting- Clothing Manipulation and Hygiene: Moderate assistance, Sit to/from stand Tub/ Shower Transfer: Moderate assistance, Stand-pivot, Tub bench Functional mobility during ADLs: Wheelchair General ADL Comments: Session focused on seated  grooming tasks as well as improved fine motor coordination and hand strength in preparation  for preferred leisure occupation of crocheting. Pt demonstrating improved ability to manipulate putty this session. Facilitated improved fine motor coordination with rotation tasks with R hand utilizing pen with built-up handle. Pt better able to complete simple writing tasks and was able to legibly write her name.     Mobility  Overal bed mobility: Needs Assistance Bed Mobility: Supine to Sit, Sit to Supine Supine to sit: Mod assist Sit to supine: Mod assist General bed mobility comments: Pt OOB in w/c on OT arrival.     Transfers  Overall transfer level: Needs assistance Equipment used: 1 person hand held assist Transfers: Sit to/from Stand, Stand Pivot Transfers Sit to Stand: Mod assist Stand pivot transfers: Mod assist General transfer comment: didn't complete, worked on w/c mobility    Ambulation / Gait / Stairs / Financial controller Details: unable Naval architect mobility: Yes Wheelchair propulsion: Both upper extremities Wheelchair parts: Supervision/cueing Distance: 350 Wheelchair Assistance Details (indicate cue type and reason): pt required multiple rest breaks, reported R hand not feeling as strong as L however pt able to propel self in straight line and perform turns in small radius without assist and safely    Posture / Balance Dynamic Sitting Balance Sitting balance - Comments: sitting EOB for approx 15 min for HEP Balance Overall balance assessment: Needs assistance Sitting-balance support: Single extremity supported, Feet supported Sitting balance-Leahy Scale: Fair Sitting balance - Comments: sitting EOB for approx 15 min for HEP Standing balance support: Bilateral upper extremity supported Standing balance-Leahy Scale: Poor Standing balance comment: dependent on external support from therapist    Special needs/care  consideration BiPAP/CPAP: No CPM: No Continuous Drip IV: No Dialysis: No         Life Vest: No Oxygen: No Special Bed: No Trach Size: No Wound Vac (area): No       Skin: WDL                               Bowel mgmt: 01/29/17, Continent  Bladder mgmt: Continent  Diabetic mgmt: Yes, but managed with diet prior to admission      Previous Home Environment Living Arrangements: Children  Lives With: Daughter Available Help at Discharge: Personal care attendant, Available PRN/intermittently, Family Type of Home: Apartment Home Layout: One level Home Access: Ramped entrance Bathroom Shower/Tub: Tub/shower unit, Engineer, building services: Standard Bathroom Accessibility: Yes (has limited help due to small BR area, wc and pt only) How Accessible: Accessible via wheelchair (has limited help due to small BR area, wc and pt only) Home Care Services: No  Discharge Living Setting Plans for Discharge Living Setting: Lives with (comment) (Daughter ) Type of Home at Discharge: Apartment Discharge Home Layout: One level Discharge Home Access: Ramped entrance Discharge Bathroom Shower/Tub: Tub/shower unit Discharge Bathroom Toilet: Standard (but patient elevates with bedside cammode ) Discharge Bathroom Accessibility: Yes How Accessible: Accessible via wheelchair Does the patient have any problems obtaining your medications?: No  Social/Family/Support Systems Patient Roles: Parent Contact Information: Daughter: Kerry Hough [phone] Anticipated Caregiver: Daughter and hired caregivers through Science writer Information: see above Ability/Limitations of Caregiver: She works and has hired assist through Geographical information systems officer Availability: 24/7 Discharge Plan Discussed with Primary Caregiver: Yes Is Caregiver In Agreement with Plan?: Yes Does Caregiver/Family have Issues with Lodging/Transportation while Pt is in Rehab?: No  Goals/Additional  Needs Patient/Family Goal for Rehab: PT/OT Mod I-Supervision; SLP Mod  I  Expected length of stay: 7-11 days  Cultural Considerations: None Dietary Needs: Heart Healthy restrictions; self monitors carbs. Equipment Needs: TBD Special Service Needs: None Additional Information: Patient with history of Right AKA Pt/Family Agrees to Admission and willing to participate: Yes Program Orientation Provided & Reviewed with Pt/Caregiver Including Roles  & Responsibilities: Yes Additional Information Needs: None Information Needs to be Provided By: N/A  Decrease burden of Care through IP rehab admission: No  Possible need for SNF placement upon discharge: No  Patient Condition: This patient's medical and functional status has changed since the consult dated 01/29/17 in which the Rehabilitation Physician determined and documented that the patient was potentially appropriate for intensive rehabilitative care in an inpatient rehabilitation facility. Issues have been addressed and update has been discussed with Dr. Riley Kill and patient now appropriate for inpatient rehabilitation. Will admit to inpatient rehab today.   Preadmission Screen Completed By:  [name], 01/30/2017 10:13 AM ______________________________________________________________________   Discussed status with Dr. Riley Kill on 01/30/17 at 1133 and received telephone approval for admission today.  Admission Coordinator:  [name], time 1133/Date 01/30/17       Cosigned by: Ranelle Oyster, MD at 01/30/2017 2:21 PM  Revision History

## 2017-01-30 NOTE — Progress Notes (Signed)
   Subjective:  Ms Azari Druschel was seen sitting comfortably in her wheelchair this AM. She states that she continues to feel well and is trending towards her baseline. Her right hand still feels weak with fine motor movement but her speech is much improved.  Objective:  Vital signs in last 24 hours: Vitals:   01/30/17 0150 01/30/17 0600 01/30/17 0700 01/30/17 0947  BP: 130/63 132/69 (!) 143/74 137/73  Pulse: 91 90 86 84  Resp: 20 20 18 18   Temp: 98 F (36.7 C) 97.7 F (36.5 C) 98 F (36.7 C) (!) 97.5 F (36.4 C)  TempSrc: Oral Oral Oral Oral  SpO2: 99% 99% 98% 99%  Weight:      Height:       Physical Exam  Constitutional:  Elderly woman who appears younger than stated age sitting comfortably in wheelchair in no acute distress.  Cardiovascular: Normal rate, regular rhythm, normal heart sounds and intact distal pulses.   Pulmonary/Chest: Effort normal. No respiratory distress. She has no wheezes.  Abdominal: Soft. She exhibits no distension. There is no tenderness. There is no guarding.  Musculoskeletal:  Patient s/p R AKA  Neurological:  EOM intact. Face strength and sensation intact bilaterally. Tongue midline. 4+/5 RUE and R grip strength. 5/5 LUE and L grip strength. Speech fluent and not slurred.  Skin: Skin is warm and dry. Capillary refill takes less than 2 seconds. No rash noted. No erythema.   Assessment/Plan:  Active Problems:   Right arm weakness   Weakness   Cerebral thrombosis with cerebral infarction   Ataxia, post-stroke   Diabetes mellitus type 2 in nonobese ([name])   Stage 3 chronic kidney disease   History of right above knee amputation ([name])   History of CVA (cerebrovascular accident)   Benign essential HTN   Transient cerebral ischemia  Carolyn Werner is a 83 yo woman with a PMH of CVA, left ICA stenosis (80-99% stenosis on 07/2016), PVD with R AKA, HTN, HLD, and DM who presented with acute onset right arm weakness and slurred speech. She was  admitted to the internal medicine teaching service with neurology consulting. The specific problems addressed during admission are as follows:  Left paramedian pons stroke with RUE weakness, slurred speech:The patient's strength and speech continues to improve with active PT/OT participation. The patient's stroke workup is complete and since the patient does not desire surgical intervention for left ICA disease, will be followed as an outpatient with neurology regarding risk management. They want to see her in 6 weeks.  -Hgb A1c = 6.9% -Lipid panel showed HDL = 29, LDL = 134 -Echocardiogram with EF of 60-65% -Continue ASA 81 mg and Brillinta 90 mg BID -Telemetry and neuro checks -CIR today  Hx of PVD with R QQI:WLNL provide home cymbalta 30 mg and gapapentin 300 mg daily for persistent phantom pains in R lower extremity.   Hx of GXQ:JJHERDE patient's home BP medication for permissive hypertension. Plan to restart home amlodipine 2.5mg  daily on discharge.  Chronic constipation: Colace, 200 mg BID and miralax, daily PRN.   Diet: Heart healthy  VTE prophylaxis: Lovenox  Dispo: Anticipated discharge in approximately today.   Rozann Lesches, MD 01/30/2017, 10:23 AM Pager: [phone]

## 2017-01-30 NOTE — Progress Notes (Signed)
Marcello Fennel, MD Physician Signed Physical Medicine and Rehabilitation  Consult Note Date of Service: 01/29/2017 6:07 AM  Related encounter: ED to Hosp-Admission (Current) from 01/26/2017 in [facility] 57[facility]URO MEDICAL     Expand All Collapse All   [] Hide copied text [] Hover for attribution information      Physical Medicine and Rehabilitation Consult Reason for Consult: Right side weakness Referring Physician: [facility]   HPI: Carolyn Werner is a 83 y.o. right handed female with history of diabetes mellitus, hyperlipidemia, CKD with creatinine 1.23, right AKA 5 years ago, CVA approximately 6 months ago related to left ICA high-grade stenosis and maintained on aspirin. Per chart review, patient, and family, patient lives with daughter as well as a personal care attendant a couple days a week and also attends adult daycare. Needed daughter and attendant to help with house and simple ADLs. One level home with a ramped entrance. Patient does not use a prosthesis primarily wheelchair bound. Daughter works during the day. Presented 01/26/2017 with increasing right-sided weakness. CT/MRI reviewed, showing left pons infarct. Per report, acute infarction of the left paramedian pons representing a new area of ischemia compared with previous study from 07/27/2016. Atrophy and small vessel disease with multiple old infarctions. MRA showed no appreciable flow related enhancement in either distal vertebral artery with a proximal to mid basilar artery. Intracranial atherosclerotic disease with significant flow limiting stenosis of the supraclinoid ICA on the left. Patient did not receive TPA. Echocardiogram is pending. Neurology consulted presently on aspirin in the edition of Brilinta. Subcutaneous Lovenox for DVT prophylaxis. Physical occupational therapy evaluations completed with recommendations of physical medicine rehabilitation consult.   Review of Systems  Constitutional:  Negative for chills and fever.  HENT: Positive for hearing loss.   Eyes: Negative for blurred vision and double vision.  Respiratory: Negative for cough and shortness of breath.   Cardiovascular: Negative for chest pain and palpitations.  Gastrointestinal: Positive for constipation. Negative for nausea.  Genitourinary: Negative for dysuria, flank pain and hematuria.  Musculoskeletal: Positive for joint pain and myalgias.  Skin: Negative for rash.  Neurological: Positive for weakness. Negative for seizures.  All other systems reviewed and are negative.      Past Medical History:  Diagnosis Date  . Diabetes mellitus without complication ([name])   . Headache   . Hx of benign neoplasm of pituitary gland   . PVD (peripheral vascular disease) ([name])   . Shingles   . Stroke [facility])         Past Surgical History:  Procedure Laterality Date  . ABOVE KNEE LEG AMPUTATION Right         Family History  Problem Relation Age of Onset  . Dementia Unknown   . Hypertension Daughter    Social History:  reports that she quit smoking about 43 years ago. She has a 5.75 pack-year smoking history. She has never used smokeless tobacco. She reports that she does not drink alcohol or use drugs. Allergies:       Allergies  Allergen Reactions  . Atacand Hct [Candesartan Cilexetil-Hctz] Other (See Comments)    Kidney disease  . Carbamazepine Other (See Comments)    Changes in ability to read, think clearly, unsteady gait  . Erythromycin Diarrhea  . Glucophage [Metformin Hcl] Other (See Comments)    Chronic low blood sugar   . Lactose Intolerance (Gi) Diarrhea and Nausea And Vomiting    Nausea & Vomiting; Diarrhea  . Lipitor [Atorvastatin] Other (See Comments)  Muscle weakness  . Nsaids Nausea And Vomiting    Motrin, Naprosyn, Voltaren, Celebrex, Prevacid, Mobic -- abdominal pain and burning, cramps, loose stool  . Penicillins Shortness Of Breath and Rash    Has patient  had a PCN reaction causing immediate rash, facial/tongue/throat swelling, SOB or lightheadedness with hypotension: Yes Has patient had a PCN reaction causing severe rash involving mucus membranes or skin necrosis: No Has patient had a PCN reaction that required hospitalization: No Has patient had a PCN reaction occurring within the last 10 years: No If all of the above answers are "NO", then may proceed with Cephalosporin use.  . Sulfa Antibiotics Anaphylaxis  . Ceclor [Cefaclor] Hives  . Clindamycin/Lincomycin Other (See Comments)    Abdominal pain, loss of appetite  . Rosuvastatin Calcium Other (See Comments)    Lethargy, malaise  . Dilaudid [Hydromorphone Hcl] Rash  . Plavix [Clopidogrel] Rash and Other (See Comments)    Skin peeled, hair fell out         Medications Prior to Admission  Medication Sig Dispense Refill  . acetaminophen (TYLENOL) 500 MG tablet Take 500-1,000 mg by mouth every 6 (six) hours as needed (pain).    Marland Kitchen amLODipine (NORVASC) 2.5 MG tablet Take 2.5 mg by mouth daily with supper.     Marland Kitchen aspirin EC 81 MG EC tablet Take 1 tablet (81 mg total) by mouth daily. 90 tablet 2  . Biotin 5 MG TABS Take 5 mg by mouth daily with supper.     . budesonide (PULMICORT) 0.25 MG/2ML nebulizer solution Take 0.25 mg by nebulization See admin instructions. Inhale 1 vial (0.25 mg) via nebulization every other night    . Cholecalciferol (VITAMIN D) 2000 units CAPS Take 2,000 Units by mouth daily.    Marland Kitchen dipyridamole-aspirin (AGGRENOX) 200-25 MG 12hr capsule Take once a day at bedtime; after 08/11/16 please take twice a day (Patient taking differently: Take 1 capsule by mouth 2 (two) times daily. ) 90 capsule 2  . docusate sodium (COLACE) 100 MG capsule Take 200 mg by mouth 2 (two) times daily.     . DULoxetine (CYMBALTA) 30 MG capsule Take 30 mg by mouth daily.    . Flaxseed, Linseed, (FLAXSEED OIL) 1000 MG CAPS Take 1,000 mg by mouth 3 (three) times daily.    .  fluticasone (FLONASE) 50 MCG/ACT nasal spray Place 1 spray into both nostrils daily as needed for allergies or rhinitis.    Marland Kitchen gabapentin (NEURONTIN) 100 MG capsule Take 1 capsule (100 mg total) by mouth every morning. (Patient taking differently: Take 100-200 mg by mouth See admin instructions. Take 1 capsule (100 mg) by mouth every morning and 2 capsules (200 mg) at night) 30 capsule 2  . latanoprost (XALATAN) 0.005 % ophthalmic solution Place 1 drop into both eyes at bedtime.    . levalbuterol (XOPENEX HFA) 45 MCG/ACT inhaler Inhale 2 puffs into the lungs every 4 (four) hours as needed for wheezing.    Marland Kitchen loratadine (CLARITIN) 10 MG tablet Take 10 mg by mouth daily.     . Multiple Vitamin (MULTIVITAMIN WITH MINERALS) TABS tablet Take 1 tablet by mouth daily.    . sodium chloride (OCEAN) 0.65 % SOLN nasal spray Place 1 spray into both nostrils daily as needed for congestion.    . Tetrahydrozoline HCl (VISINE OP) Place 1 drop into both eyes 2 (two) times daily as needed (dry eyes).    . traMADol (ULTRAM) 50 MG tablet Take 50 mg by mouth 2 (two)  times daily as needed (pain).    Marland Kitchen gabapentin (NEURONTIN) 100 MG capsule Take 2 capsules (200 mg total) by mouth at bedtime. (Patient not taking: Reported on 01/26/2017) 90 capsule 2    Home: Home Living Family/patient expects to be discharged to:: Private residence Living Arrangements: Children Available Help at Discharge: Personal care attendant, Available PRN/intermittently, Family Type of Home: Apartment Home Access: Ramped entrance Home Layout: One level Bathroom Shower/Tub: Tub/shower unit, Engineer, building services: Standard Bathroom Accessibility: Yes (has limited help due to small BR area, wc and pt only) Home Equipment: Bedside commode, Tub bench, Grab bars - toilet, Grab bars - tub/shower, Wheelchair - power, Hospital bed, Other (comment)  Lives With: Daughter  Functional History: Prior Function Level of Independence: Needs  assistance Gait / Transfers Assistance Needed: wc walker; performing transfers independently ADL's / Homemaking Assistance Needed: independent in toileting, supervision/min A for bathing; assisted by attendant and daughter Functional Status:  Mobility: Bed Mobility Overal bed mobility: Needs Assistance Bed Mobility: Supine to Sit, Sit to Supine Supine to sit: Mod assist Sit to supine: Mod assist General bed mobility comments: assist with trunk and to scoot to EOB with bed pad, pt requires time to control sitting balance, but is able to sit EOB with supervision for HEP Transfers Overall transfer level: Needs assistance Equipment used: 1 person hand held assist Transfers: Sit to/from Stand, Stand Pivot Transfers Sit to Stand: Mod assist Stand pivot transfers: Mod assist General transfer comment: deferred today to focus on sitting balance and establish HEP Ambulation/Gait General Gait Details: not able to walk Wheelchair Mobility Wheelchair mobility: Yes Wheelchair propulsion: Both upper extremities Wheelchair parts: Supervision/cueing Distance: space of her hospital room Wheelchair Assistance Details (indicate cue type and reason): reminders for safety and to avoid strking the LLE on furniture with some impulsive turns  ADL: ADL Overall ADL's : Needs assistance/impaired Eating/Feeding: Set up, With adaptive utensils, Sitting Eating/Feeding Details (indicate cue type and reason): able to eat dinner with food cut up for her and red built up handles on utensils - Pt using RUE to self- feed and encouraged to continue Grooming: Minimal assistance, Sitting Upper Body Bathing: Moderate assistance, Sitting Lower Body Bathing: Moderate assistance, With adaptive equipment, Sitting/lateral leans Upper Body Dressing : Moderate assistance, Sitting Lower Body Dressing: Maximal assistance, Sit to/from stand Toilet Transfer: Moderate assistance, Stand-pivot, BSC Toilet Transfer Details (indicate  cue type and reason): face to face no DME for transfer to the left Toileting- Clothing Manipulation and Hygiene: Moderate assistance, Sit to/from stand Tub/ Shower Transfer: Moderate assistance, Stand-pivot, Tub bench Functional mobility during ADLs: Wheelchair General ADL Comments: Focus of session was establishing HEP for RUE - no ADL addressed this session  Cognition: Cognition Overall Cognitive Status: History of cognitive impairments - at baseline Arousal/Alertness: Awake/alert Orientation Level: Oriented X4 Attention: Sustained Sustained Attention: Appears intact Memory: Impaired Memory Impairment: Decreased short term memory, Decreased recall of new information Decreased Short Term Memory: Verbal basic, Functional basic Awareness: Appears intact Safety/Judgment: Appears intact Cognition Arousal/Alertness: Awake/alert Behavior During Therapy: WFL for tasks assessed/performed Overall Cognitive Status: History of cognitive impairments - at baseline General Comments: pt is a participant in adult day care program  Blood pressure (!) 161/69, pulse 81, temperature 98.7 F (37.1 C), temperature source Oral, resp. rate 20, height 5\' 4"  (1.626 m), weight 61.3 kg (135 lb 1.6 oz), SpO2 96 %. Physical Exam  Vitals reviewed. Constitutional: She is oriented to person, place, and time. She appears well-developed and well-nourished.  HENT:  Head: Normocephalic and atraumatic.  Eyes: EOM are normal. Right eye exhibits no discharge. Left eye exhibits no discharge.  Neck: Normal range of motion. Neck supple. No thyromegaly present.  Cardiovascular: Normal rate, regular rhythm and normal heart sounds.   Respiratory: Effort normal and breath sounds normal. No respiratory distress.  GI: Soft. Bowel sounds are normal. She exhibits no distension.  Musculoskeletal: She exhibits no edema or tenderness.  +Right AKA  Neurological: She is alert and oriented to person, place, and time.  Patient is  hard of hearing and follow simple commands. Motor: LUE/LLE: 5/5 proximal to distal RUE: 4+/5 proximal to distal with ataxia RLE: 5/5 proximal to distal Sensation intact to light touch DTRs symmetric  Skin: Skin is warm and dry.  Right lower extremity amputation site is well-healed  Psychiatric: She has a normal mood and affect. Her behavior is normal. Thought content normal.    Lab Results Last 24 Hours       Results for orders placed or performed during the hospital encounter of 01/26/17 (from the past 24 hour(s))  Glucose, capillary     Status: Abnormal   Collection Time: 01/28/17  7:15 AM  Result Value Ref Range   Glucose-Capillary 120 (H) 65 - 99 mg/dL  Glucose, capillary     Status: Abnormal   Collection Time: 01/28/17 11:27 AM  Result Value Ref Range   Glucose-Capillary 156 (H) 65 - 99 mg/dL  Glucose, capillary     Status: Abnormal   Collection Time: 01/28/17  4:19 PM  Result Value Ref Range   Glucose-Capillary 109 (H) 65 - 99 mg/dL      Imaging Results (Last 48 hours)  Mr Maxine Glenn Head Wo Contrast  Result Date: 01/27/2017 CLINICAL DATA:  History of CVA and LEFT ICA stenosis presenting with RIGHT arm weakness and slurred speech. EXAM: MRI HEAD WITHOUT CONTRAST MRA HEAD WITHOUT CONTRAST MRA NECK WITHOUT CONTRAST TECHNIQUE: Multiplanar, multiecho pulse sequences of the brain and surrounding structures were obtained without intravenous contrast. Angiographic images of the Circle of Willis were obtained using MRA technique without intravenous contrast. Angiographic images of the neck were obtained using MRA technique without intravenous contrast. Carotid stenosis measurements (when applicable) are obtained utilizing NASCET criteria, using the distal internal carotid diameter as the denominator. COMPARISON:  CT head 01/26/2017.  MR head 07/27/2016. FINDINGS: MRI HEAD FINDINGS Brain: Restricted diffusion affects the LEFT paramedian pons in its midportion consistent with acute  infarction. This measures approximately 1 cm in its longest dimension. There is no visible acute hemorrhage. Premature for age atrophy. Chronic microvascular ischemic change. Chronic LEFT occipital infarct. Chronic BILATERAL cerebellar infarcts. Chronic pontine infarcts. Chronic hemorrhage along the LEFT pons, more lateral to the area of acute infarction. Vascular: Reported separately. Skull and upper cervical spine: Normal marrow signal. Sinuses/Orbits: Chronic sinus disease. Some layering fluid LEFT maxillary region. BILATERAL cataract extraction. Other: None. MRA HEAD FINDINGS Flow reducing stenosis, 75% or greater, is C8 the junction of the RIGHT petrous and RIGHT cavernous ICA. Mild non stenotic irregularity in the cavernous and supraclinoid RIGHT ICA segments. Minor non stenotic irregularity of the skull base ICA on the LEFT. Severe stenosis of the supraclinoid ICA on the LEFT, with short segment of no flow related enhancement implying 90% or greater stenosis. LEFT ICA terminus patent. Moderately disease RIGHT M1 MCA extending to the MCA bifurcation. Mild irregularity LEFT M1 MCA. Mild moderate disease at both anterior cerebrals, worse on the RIGHT. No MCA or ACA branch occlusion. Nonvisualization of the RIGHT  and LEFT vertebral arteries. Nonvisualization of the proximal and mid segments of the basilar artery. Fetal origin RIGHT PCA. Patent LEFT posterior communicating artery appears to supply the LEFT P1 PCA, possible short-segment reflux down the distal vessel. On the previous MRA from 07/27/2016, the basilar artery was diseased but patent. Patent. MRA NECK FINDINGS Limited evaluation on this noncontrast exam. Gross patency BILATERAL common carotid arteries is is established. I am unable to assess for significant stenosis at the carotid bifurcations. There is no visible flow related enhancement of the vertebral arteries in the neck. IMPRESSION: Acute infarction of the LEFT paramedian pons representing an new  area of ischemia compared with previous study from 07/27/2016. Atrophy and small vessel disease with multiple old infarcts. There is no appreciable flow related enhancement in either distal vertebral artery or the proximal to mid basilar artery. Concern raised for acute posterior circulation thrombosis. Intracranial atherosclerotic disease elsewhere as described, with significant flow-limiting stenosis of the supraclinoid ICA on the LEFT. Essentially nondiagnostic MRA of the neck. Limited evaluation. Gross patency of both common carotid arteries is established, but there is no diagnostic visualization of the carotid bifurcations or either vertebral artery. Electronically Signed   By: Elsie Stain M.D.   On: 01/27/2017 11:57   Mr Maxine Glenn Neck Wo Contrast  Result Date: 01/27/2017 CLINICAL DATA:  History of CVA and LEFT ICA stenosis presenting with RIGHT arm weakness and slurred speech. EXAM: MRI HEAD WITHOUT CONTRAST MRA HEAD WITHOUT CONTRAST MRA NECK WITHOUT CONTRAST TECHNIQUE: Multiplanar, multiecho pulse sequences of the brain and surrounding structures were obtained without intravenous contrast. Angiographic images of the Circle of Willis were obtained using MRA technique without intravenous contrast. Angiographic images of the neck were obtained using MRA technique without intravenous contrast. Carotid stenosis measurements (when applicable) are obtained utilizing NASCET criteria, using the distal internal carotid diameter as the denominator. COMPARISON:  CT head 01/26/2017.  MR head 07/27/2016. FINDINGS: MRI HEAD FINDINGS Brain: Restricted diffusion affects the LEFT paramedian pons in its midportion consistent with acute infarction. This measures approximately 1 cm in its longest dimension. There is no visible acute hemorrhage. Premature for age atrophy. Chronic microvascular ischemic change. Chronic LEFT occipital infarct. Chronic BILATERAL cerebellar infarcts. Chronic pontine infarcts. Chronic hemorrhage  along the LEFT pons, more lateral to the area of acute infarction. Vascular: Reported separately. Skull and upper cervical spine: Normal marrow signal. Sinuses/Orbits: Chronic sinus disease. Some layering fluid LEFT maxillary region. BILATERAL cataract extraction. Other: None. MRA HEAD FINDINGS Flow reducing stenosis, 75% or greater, is C8 the junction of the RIGHT petrous and RIGHT cavernous ICA. Mild non stenotic irregularity in the cavernous and supraclinoid RIGHT ICA segments. Minor non stenotic irregularity of the skull base ICA on the LEFT. Severe stenosis of the supraclinoid ICA on the LEFT, with short segment of no flow related enhancement implying 90% or greater stenosis. LEFT ICA terminus patent. Moderately disease RIGHT M1 MCA extending to the MCA bifurcation. Mild irregularity LEFT M1 MCA. Mild moderate disease at both anterior cerebrals, worse on the RIGHT. No MCA or ACA branch occlusion. Nonvisualization of the RIGHT and LEFT vertebral arteries. Nonvisualization of the proximal and mid segments of the basilar artery. Fetal origin RIGHT PCA. Patent LEFT posterior communicating artery appears to supply the LEFT P1 PCA, possible short-segment reflux down the distal vessel. On the previous MRA from 07/27/2016, the basilar artery was diseased but patent. Patent. MRA NECK FINDINGS Limited evaluation on this noncontrast exam. Gross patency BILATERAL common carotid arteries is is established. I  am unable to assess for significant stenosis at the carotid bifurcations. There is no visible flow related enhancement of the vertebral arteries in the neck. IMPRESSION: Acute infarction of the LEFT paramedian pons representing an new area of ischemia compared with previous study from 07/27/2016. Atrophy and small vessel disease with multiple old infarcts. There is no appreciable flow related enhancement in either distal vertebral artery or the proximal to mid basilar artery. Concern raised for acute posterior  circulation thrombosis. Intracranial atherosclerotic disease elsewhere as described, with significant flow-limiting stenosis of the supraclinoid ICA on the LEFT. Essentially nondiagnostic MRA of the neck. Limited evaluation. Gross patency of both common carotid arteries is established, but there is no diagnostic visualization of the carotid bifurcations or either vertebral artery. Electronically Signed   By: Elsie Stain M.D.   On: 01/27/2017 11:57   Mr Brain Wo Contrast  Result Date: 01/27/2017 CLINICAL DATA:  History of CVA and LEFT ICA stenosis presenting with RIGHT arm weakness and slurred speech. EXAM: MRI HEAD WITHOUT CONTRAST MRA HEAD WITHOUT CONTRAST MRA NECK WITHOUT CONTRAST TECHNIQUE: Multiplanar, multiecho pulse sequences of the brain and surrounding structures were obtained without intravenous contrast. Angiographic images of the Circle of Willis were obtained using MRA technique without intravenous contrast. Angiographic images of the neck were obtained using MRA technique without intravenous contrast. Carotid stenosis measurements (when applicable) are obtained utilizing NASCET criteria, using the distal internal carotid diameter as the denominator. COMPARISON:  CT head 01/26/2017.  MR head 07/27/2016. FINDINGS: MRI HEAD FINDINGS Brain: Restricted diffusion affects the LEFT paramedian pons in its midportion consistent with acute infarction. This measures approximately 1 cm in its longest dimension. There is no visible acute hemorrhage. Premature for age atrophy. Chronic microvascular ischemic change. Chronic LEFT occipital infarct. Chronic BILATERAL cerebellar infarcts. Chronic pontine infarcts. Chronic hemorrhage along the LEFT pons, more lateral to the area of acute infarction. Vascular: Reported separately. Skull and upper cervical spine: Normal marrow signal. Sinuses/Orbits: Chronic sinus disease. Some layering fluid LEFT maxillary region. BILATERAL cataract extraction. Other: None. MRA HEAD  FINDINGS Flow reducing stenosis, 75% or greater, is C8 the junction of the RIGHT petrous and RIGHT cavernous ICA. Mild non stenotic irregularity in the cavernous and supraclinoid RIGHT ICA segments. Minor non stenotic irregularity of the skull base ICA on the LEFT. Severe stenosis of the supraclinoid ICA on the LEFT, with short segment of no flow related enhancement implying 90% or greater stenosis. LEFT ICA terminus patent. Moderately disease RIGHT M1 MCA extending to the MCA bifurcation. Mild irregularity LEFT M1 MCA. Mild moderate disease at both anterior cerebrals, worse on the RIGHT. No MCA or ACA branch occlusion. Nonvisualization of the RIGHT and LEFT vertebral arteries. Nonvisualization of the proximal and mid segments of the basilar artery. Fetal origin RIGHT PCA. Patent LEFT posterior communicating artery appears to supply the LEFT P1 PCA, possible short-segment reflux down the distal vessel. On the previous MRA from 07/27/2016, the basilar artery was diseased but patent. Patent. MRA NECK FINDINGS Limited evaluation on this noncontrast exam. Gross patency BILATERAL common carotid arteries is is established. I am unable to assess for significant stenosis at the carotid bifurcations. There is no visible flow related enhancement of the vertebral arteries in the neck. IMPRESSION: Acute infarction of the LEFT paramedian pons representing an new area of ischemia compared with previous study from 07/27/2016. Atrophy and small vessel disease with multiple old infarcts. There is no appreciable flow related enhancement in either distal vertebral artery or the proximal to mid basilar  artery. Concern raised for acute posterior circulation thrombosis. Intracranial atherosclerotic disease elsewhere as described, with significant flow-limiting stenosis of the supraclinoid ICA on the LEFT. Essentially nondiagnostic MRA of the neck. Limited evaluation. Gross patency of both common carotid arteries is established, but there  is no diagnostic visualization of the carotid bifurcations or either vertebral artery. Electronically Signed   By: Elsie Stain M.D.   On: 01/27/2017 11:57     Assessment/Plan: Diagnosis:  Left paramedian pons infarct Labs and images independently reviewed.  Records reviewed and summated above. Stroke: Continue secondary stroke prophylaxis and Risk Factor Modification listed below:   Antiplatelet therapy:   Blood Pressure Management:  Continue current medication with prn's with permisive HTN per primary team Statin Agent:   Diabetes management:    1. Does the need for close, 24 hr/day medical supervision in concert with the patient's rehab needs make it unreasonable for this patient to be served in a less intensive setting? Yes 2. Co-Morbidities requiring supervision/potential complications: diabetes mellitus (Monitor in accordance with exercise and adjust meds as necessary), hyperlipidemia (cont meds), CKD (avoid nephrotoxic meds), right AKA 5 years ago, CVA approximately 6 months ago related to left ICA high-grade stenosis, HTN (monitor and provide prns in accordance with increased physical exertion and pain) 3. Due to safety, disease management and patient education, does the patient require 24 hr/day rehab nursing? Potentially 4. Does the patient require coordinated care of a physician, rehab nurse, PT (1-2 hrs/day, 5 days/week) and OT (1-2 hrs/day, 5 days/week) to address physical and functional deficits in the context of the above medical diagnosis(es)? Potentially Addressing deficits in the following areas: balance, endurance, locomotion, strength, transferring, bathing, dressing, toileting and psychosocial support 5. Can the patient actively participate in an intensive therapy program of at least 3 hrs of therapy per day at least 5 days per week? Yes 6. The potential for patient to make measurable gains while on inpatient rehab is excellent 7. Anticipated functional outcomes upon  discharge from inpatient rehab are Supervision  with PT, supervision with OT, NA with SLP. 8. Estimated rehab length of stay to reach the above functional goals is: 7-11 days. 9. Anticipated D/C setting: Home 10. Anticipated post D/C treatments: HH therapy and Home excercise program 11. Overall Rehab/Functional Prognosis: good  RECOMMENDATIONS: This patient's condition is appropriate for continued rehabilitative care in the following setting: CIR after completion of medical workup. Patient has agreed to participate in recommended program. Yes Note that insurance prior authorization may be required for reimbursement for recommended care.  Comment: Rehab Admissions Coordinator to follow up.  Maryla Morrow, MD, [place] Dom Charlton Amor., PA-C 01/29/2017    Revision History                        Routing History

## 2017-01-30 NOTE — Progress Notes (Signed)
Inpatient Rehabilitation  I have a bed available to offer patient today and have received acute medical clearance.  Plan to proceed with admission today.  Please call with questions.   Charlane Ferretti., CCC/SLP Admission Coordinator  [facility] Inpatient Rehabilitation  Cell [phone]

## 2017-01-30 NOTE — Progress Notes (Signed)
Internal Medicine Attending  Date: 01/30/2017  Patient name: Carolyn Werner Medical record number: [id number] Date of birth: January 15, 1922 Age: 83 y.o. Gender: female  I saw and evaluated the patient. I reviewed the resident's note by Dr. Saunders Revel and I agree with the resident's findings and plans as documented in her progress note.  When seen on rounds this morning Carolyn Werner was continuing to improve neurologically. She feels the right upper extremity strength is even better than yesterday but she continues to have difficulty with fine motor skills in her hand. She states her speech is at baseline. She's been accepted to CIR for continued rehabilitation. She is therefore discharged to there today.

## 2017-01-30 NOTE — Progress Notes (Signed)
Patient taken down to room 7.  All belongings and personal wheelchair with patient.   Report given.

## 2017-01-30 NOTE — PMR Pre-admission (Signed)
PMR Admission Coordinator Pre-Admission Assessment  Patient: Carolyn Werner is an 83 y.o., female MRN: [id number] DOB: Dec 07, 1921 Height: 5\' 4"  (162.6 cm) Weight: 61.3 kg (135 lb 1.6 oz)              Insurance Information HMO:     PPO:      PCP:      IPA:      80/20:      OTHER:  PRIMARY: Medicare A & B      Policy#: [id number]      Subscriber: Self  CM Name:       Phone#:      Fax#:  Pre-Cert#: Eligible     Employer: Retired  Benefits:  Phone #: Verified online     Name: Passport One Eff. Date: 4/1/11990     Deduct: $1340      Out of Pocket Max: N/A      Life Max: N/A CIR: 100%      SNF: 100% days 1-20; 80% days 21-100 Outpatient: 80%     Co-Pay: 20% Home Health: 100%      Co-Pay: None DME: 80%     Co-Pay: 20% Providers: Patient's Choice   SECONDARY: [employer] Employee PPO      Policy#: [id number]      Subscriber: Self CM Name:       Phone#:      Fax#:  Pre-Cert#:       Employer: Retired  Benefits:  Phone #:      Name:  Eff. Date:      Deduct:       Out of Pocket Max:       Life Max:  CIR:       SNF:  Outpatient:      Co-Pay:  Home Health:       Co-Pay:  DME:      Co-Pay:   Medicaid Application Date:       Case Manager:  Disability Application Date:       Case Worker:   Emergency Contact Information Contact Information    Name Relation Home Work Mobile   Addo,Kathryn Daughter [phone]     Devoria Glassing   [phone]     Current Medical History  Patient Admitting Diagnosis:  Left paramedian pons infarct  History of Present Illness: Carolyn Washingtonis a 83 y.o. 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 MRI/MRA head done revealing acute infarct in left paramedian  pons with concern for acute PCA thrombosis and significant flow limiting stenosis left supraclinoid and non diagnostic neck--limited exam. Patient with improving symptoms post admission. CTA head/neck recommended but family declined.  2D echo with EF 60-65% with grade one diastolic dysfunction, calcified mitral annulus and lipomatous atrial septal hypertrophy. Dr. Pearlean Brownie recommended ASA and Brilinta for stroke prophylaxis and BP goal 130-160 due to severe vascular stenosis.   NIH Total: 0    Past Medical History  Past Medical History:  Diagnosis Date  . DDD (degenerative disc disease), cervical   . Diabetes mellitus without complication ([name])   . Headache   . Hx of benign neoplasm of pituitary gland   . IBS (irritable colon syndrome)-C   . Mild persistent asthma   . PVD (peripheral vascular disease) ([name])   . Shingles   .  Stroke [facility])     Family History  family history includes Dementia in her unknown relative; Hypertension in her daughter.  Prior Rehab/Hospitalizations:  Has the patient had major surgery during 100 days prior to admission? No  Current Medications   Current Facility-Administered Medications:  .  acetaminophen (TYLENOL) tablet 650 mg, 650 mg, Oral, Q4H PRN, 650 mg at 01/30/17 0543 **OR** acetaminophen (TYLENOL) solution 650 mg, 650 mg, Per Tube, Q4H PRN **OR** acetaminophen (TYLENOL) suppository 650 mg, 650 mg, Rectal, Q4H PRN, Reymundo Poll, MD .  aspirin chewable tablet 81 mg, 81 mg, Oral, Daily, Paulette Blanch, PA-C, 81 mg at 01/30/17 0936 .  budesonide (PULMICORT) nebulizer solution 0.25 mg, 0.25 mg, Nebulization, Q48H, Darreld Mclean, MD, 0.25 mg at 01/29/17 2049 .  cholecalciferol (VITAMIN D) tablet 2,000 Units, 2,000 Units, Oral, Daily, Reymundo Poll, MD, 2,000 Units at 01/30/17 [phone] .  docusate sodium (COLACE) capsule 200 mg, 200 mg, Oral, BID, Reymundo Poll, MD, 200 mg at 01/30/17 0935 .  DULoxetine (CYMBALTA) DR capsule 30 mg, 30 mg, Oral,  Daily, Reymundo Poll, MD, 30 mg at 01/30/17 0936 .  enoxaparin (LOVENOX) injection 30 mg, 30 mg, Subcutaneous, QHS, Tyson Alias, MD, 30 mg at 01/29/17 2135 .  fluticasone (FLONASE) 50 MCG/ACT nasal spray 2 spray, 2 spray, Each Nare, Daily PRN, Reymundo Poll, MD .  gabapentin (NEURONTIN) capsule 100 mg, 100 mg, Oral, Daily, 100 mg at 01/30/17 0935 **AND** gabapentin (NEURONTIN) capsule 200 mg, 200 mg, Oral, QHS, Tyson Alias, MD, 200 mg at 01/29/17 2134 .  latanoprost (XALATAN) 0.005 % ophthalmic solution 1 drop, 1 drop, Both Eyes, QHS, Guilloud, Eber Jones, MD, 1 drop at 01/29/17 2135 .  levalbuterol (XOPENEX) nebulizer solution 0.63 mg, 0.63 mg, Nebulization, Q6H PRN, Tyson Alias, MD .  loratadine (CLARITIN) tablet 10 mg, 10 mg, Oral, Daily, Reymundo Poll, MD, 10 mg at 01/30/17 1191 .  multivitamin with minerals tablet 1 tablet, 1 tablet, Oral, Daily, Reymundo Poll, MD, 1 tablet at 01/30/17 0935 .  polyethylene glycol (MIRALAX / GLYCOLAX) packet 17 g, 17 g, Oral, Daily PRN, Reymundo Poll, MD .  polyethylene glycol (MIRALAX / GLYCOLAX) packet 17 g, 17 g, Oral, Daily, Tyson Alias, MD, 17 g at 01/29/17 0916 .  sodium chloride (OCEAN) 0.65 % nasal spray 1 spray, 1 spray, Each Nare, Daily PRN, Reymundo Poll, MD .  tetrahydrozoline 0.05 % ophthalmic solution 1 drop, 1 drop, Both Eyes, BID PRN, Reymundo Poll, MD .  ticagrelor (BRILINTA) tablet 90 mg, 90 mg, Oral, BID, Micki Riley, MD, 90 mg at 01/30/17 0935  Patients Current Diet: Diet Heart Room service appropriate? Yes; Fluid consistency: Thin  Precautions / Restrictions Precautions Precautions: Fall Precaution Comments: R AKA Restrictions Weight Bearing Restrictions: No Other Position/Activity Restrictions: R AKA with phantom pain and L Knee and leg very sensitive from PVD   Has the patient had 2 or more falls or a fall with injury in the past year?No  Prior Activity  Level Limited Community (1-2x/wk): Prior to admission patient was out in the community twice a week to go to daycare and would go out with her daughter to go to medical appointments.   Home Assistive Devices / Equipment Home Assistive Devices/Equipment: Bedside commode/3-in-1, Eyeglasses, Shower chair with back Home Equipment: Bedside commode, Tub bench, Grab bars - toilet, Grab bars - tub/shower, Wheelchair - power, Hospital bed, Other (comment)  Prior Device Use: Indicate devices/aids used by the patient prior to current illness, exacerbation or injury? Manual  wheelchair  Prior Functional Level Prior Function Level of Independence: Needs assistance Gait / Transfers Assistance Needed: wc walker; performing transfers independently ADL's / Homemaking Assistance Needed: independent in toileting, supervision/min A for bathing; assisted by attendant and daughter  Self Care: Did the patient need help bathing, dressing, using the toilet or eating? Independent  Indoor Mobility: Did the patient need assistance with walking from room to room (with or without device)? Independent  Stairs: Did the patient need assistance with internal or external stairs (with or without device)? Dependent  Functional Cognition: Did the patient need help planning regular tasks such as shopping or remembering to take medications? Needed some help  Current Functional Level Cognition  Arousal/Alertness: Awake/alert Overall Cognitive Status: History of cognitive impairments - at baseline Orientation Level: Oriented X4 General Comments: pt is a participant in adult day care program Attention: Sustained Sustained Attention: Appears intact Memory: Impaired Memory Impairment: Decreased short term memory, Decreased recall of new information Decreased Short Term Memory: Verbal basic, Functional basic Awareness: Appears intact Safety/Judgment: Appears intact    Extremity Assessment (includes Sensation/Coordination)   Upper Extremity Assessment: RUE deficits/detail RUE Deficits / Details: decreased grip strength, noted ataxic movement in RUE when reaching for objects grossly 3+/5 RUE Sensation:  [facility]) RUE Coordination: decreased fine motor, decreased gross motor  Lower Extremity Assessment: Defer to PT evaluation    ADLs  Overall ADL's : Needs assistance/impaired Eating/Feeding: Set up, With adaptive utensils, Sitting Eating/Feeding Details (indicate cue type and reason): able to eat dinner with food cut up for her and red built up handles on utensils - Pt using RUE to self- feed and encouraged to continue Grooming: Brushing hair, Sitting, Minimal assistance Grooming Details (indicate cue type and reason): Able to brush her hair with assistance for thoroughness this session. Improved ability to manipulate comb.  Upper Body Bathing: Moderate assistance, Sitting Lower Body Bathing: Moderate assistance, With adaptive equipment, Sitting/lateral leans Upper Body Dressing : Moderate assistance, Sitting Lower Body Dressing: Maximal assistance, Sit to/from stand Toilet Transfer: Moderate assistance, Stand-pivot, BSC Toilet Transfer Details (indicate cue type and reason): face to face no DME for transfer to the left Toileting- Clothing Manipulation and Hygiene: Moderate assistance, Sit to/from stand Tub/ Shower Transfer: Moderate assistance, Stand-pivot, Tub bench Functional mobility during ADLs: Wheelchair General ADL Comments: Session focused on seated grooming tasks as well as improved fine motor coordination and hand strength in preparation for preferred leisure occupation of crocheting. Pt demonstrating improved ability to manipulate putty this session. Facilitated improved fine motor coordination with rotation tasks with R hand utilizing pen with built-up handle. Pt better able to complete simple writing tasks and was able to legibly write her name.     Mobility  Overal bed mobility: Needs Assistance Bed  Mobility: Supine to Sit, Sit to Supine Supine to sit: Mod assist Sit to supine: Mod assist General bed mobility comments: Pt OOB in w/c on OT arrival.     Transfers  Overall transfer level: Needs assistance Equipment used: 1 person hand held assist Transfers: Sit to/from Stand, Stand Pivot Transfers Sit to Stand: Mod assist Stand pivot transfers: Mod assist General transfer comment: didn't complete, worked on w/c mobility    Ambulation / Gait / Stairs / Financial controller Details: unable Naval architect mobility: Yes Wheelchair propulsion: Both upper extremities Wheelchair parts: Supervision/cueing Distance: 350 Wheelchair Assistance Details (indicate cue type and reason): pt required multiple rest breaks, reported R hand not feeling as strong as L however pt  able to propel self in straight line and perform turns in small radius without assist and safely    Posture / Balance Dynamic Sitting Balance Sitting balance - Comments: sitting EOB for approx 15 min for HEP Balance Overall balance assessment: Needs assistance Sitting-balance support: Single extremity supported, Feet supported Sitting balance-Leahy Scale: Fair Sitting balance - Comments: sitting EOB for approx 15 min for HEP Standing balance support: Bilateral upper extremity supported Standing balance-Leahy Scale: Poor Standing balance comment: dependent on external support from therapist    Special needs/care consideration BiPAP/CPAP: No CPM: No Continuous Drip IV: No Dialysis: No         Life Vest: No Oxygen: No Special Bed: No Trach Size: No Wound Vac (area): No       Skin: WDL                               Bowel mgmt: 01/29/17, Continent  Bladder mgmt: Continent  Diabetic mgmt: Yes, but managed with diet prior to admission      Previous Home Environment Living Arrangements: Children  Lives With: Daughter Available Help at Discharge: Personal care attendant,  Available PRN/intermittently, Family Type of Home: Apartment Home Layout: One level Home Access: Ramped entrance Bathroom Shower/Tub: Tub/shower unit, Engineer, building services: Standard Bathroom Accessibility: Yes (has limited help due to small BR area, wc and pt only) How Accessible: Accessible via wheelchair (has limited help due to small BR area, wc and pt only) Home Care Services: No  Discharge Living Setting Plans for Discharge Living Setting: Lives with (comment) (Daughter ) Type of Home at Discharge: Apartment Discharge Home Layout: One level Discharge Home Access: Ramped entrance Discharge Bathroom Shower/Tub: Tub/shower unit Discharge Bathroom Toilet: Standard (but patient elevates with bedside cammode ) Discharge Bathroom Accessibility: Yes How Accessible: Accessible via wheelchair Does the patient have any problems obtaining your medications?: No  Social/Family/Support Systems Patient Roles: Parent Contact Information: Daughter: Kerry Hough [phone] Anticipated Caregiver: Daughter and hired caregivers through Science writer Information: see above Ability/Limitations of Caregiver: She works and has hired assist through Publishing rights manager: 24/7 Discharge Plan Discussed with Primary Caregiver: Yes Is Caregiver In Agreement with Plan?: Yes Does Caregiver/Family have Issues with Lodging/Transportation while Pt is in Rehab?: No  Goals/Additional Needs Patient/Family Goal for Rehab: PT/OT Mod I-Supervision; SLP Mod I  Expected length of stay: 7-11 days  Cultural Considerations: None Dietary Needs: Heart Healthy restrictions; self monitors carbs. Equipment Needs: TBD Special Service Needs: None Additional Information: Patient with history of Right AKA Pt/Family Agrees to Admission and willing to participate: Yes Program Orientation Provided & Reviewed with Pt/Caregiver Including Roles  & Responsibilities:  Yes Additional Information Needs: None Information Needs to be Provided By: N/A  Decrease burden of Care through IP rehab admission: No  Possible need for SNF placement upon discharge: No  Patient Condition: This patient's medical and functional status has changed since the consult dated 01/29/17 in which the Rehabilitation Physician determined and documented that the patient was potentially appropriate for intensive rehabilitative care in an inpatient rehabilitation facility. Issues have been addressed and update has been discussed with Dr. Riley Kill and patient now appropriate for inpatient rehabilitation. Will admit to inpatient rehab today.   Preadmission Screen Completed By:  [name], 01/30/2017 10:13 AM ______________________________________________________________________   Discussed status with Dr. Riley Kill on 01/30/17 at 1133 and received telephone approval for admission today.  Admission Coordinator:  Efraim Kaufmann  Greta Doom, time 1133/Date 01/30/17

## 2017-01-30 NOTE — Care Management Note (Signed)
Case Management Note  Patient Details  Name: Carolyn Werner MRN: [id number] Date of Birth: Apr 19, 1922  Subjective/Objective:                    Action/Plan: Pt discharging to CIR today. No further needs per CM.   Expected Discharge Date:  01/30/17               Expected Discharge Plan:  IP Rehab Facility  In-House Referral:     Discharge planning Services  CM Consult  Post Acute Care Choice:    Choice offered to:     DME Arranged:    DME Agency:     HH Arranged:    HH Agency:     Status of Service:  Completed, signed off  If discussed at [employer], dates discussed:    Additional Comments:  Kermit Balo, RN 01/30/2017, 10:47 AM

## 2017-01-30 NOTE — Progress Notes (Signed)
Physical Therapy Treatment Patient Details Name: Carolyn Werner MRN: [id number] DOB: 04/01/1922 Today's Date: 01/30/2017    History of Present Illness 83 yo female with onset of RUE weakness and noted improving but residual increased RUE weakness esp grip.  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  Hx of pituitary tumor.    PT Comments    Pt practiced transition to stand from [facility] and to get onto/off BSC.  Her skill and control of the movement was good, has demonstrated awareness that she needs to use correct hand placement and requires a little help to get there.  Will continue with strengthening BLE"s and transfers if dc is held to inpt rehab, and will focus on standing balance control as well.   Follow Up Recommendations  CIR     Equipment Recommendations  None recommended by PT    Recommendations for Other Services Rehab consult     Precautions / Restrictions Precautions Precautions: Fall Precaution Comments: R AKA Restrictions Weight Bearing Restrictions: No Other Position/Activity Restrictions: R AKA with phantom pain and L Knee and leg very sensitive from PVD    Mobility  Bed Mobility               General bed mobility comments: up in WC when PT arrived  Transfers Overall transfer level: Needs assistance Equipment used: 1 person hand held assist Transfers: Sit to/from [employer] Sit to Stand: Min guard Stand pivot transfers: Min assist       General transfer comment: pt is very good with minimal cues for hand placement, able to find a sequence wiht her quickly  Ambulation/Gait                 Administrator mobility: Yes Wheelchair propulsion: Both upper extremities Wheelchair parts: Supervision/cueing Distance: 15 Wheelchair Assistance Details (indicate cue type and  reason): round trip to BR to practice BSC transfers  Modified Rankin (Stroke Patients Only)       Balance Overall balance assessment: Needs assistance Sitting-balance support: Single extremity supported;Feet supported Sitting balance-Leahy Scale: Good       Standing balance-Leahy Scale: Poor Standing balance comment: requires hand placement to carefully support                            Cognition Arousal/Alertness: Awake/alert Behavior During Therapy: WFL for tasks assessed/performed Overall Cognitive Status: History of cognitive impairments - at baseline                                 General Comments: pt is a participant in adult day care program      Exercises General Exercises - Lower Extremity Long Arc Quad: Strengthening;Left;10 reps Heel Slides: Strengthening;Left;10 reps Hip ABduction/ADduction: Strengthening;Both;10 reps Hip Flexion/Marching: AROM;Left;10 reps Other Exercises Other Exercises: Facilitated improved fine motor coordination with in hand manipulation tasks with R hand including rotation with pen with built-up handle, finger to palm translation, as well as pinch and drop with small balls of putty.  Other Exercises: Facilitated improved w/c mobility tasks with pt able to complete with supervision to approach table prior to participation in fine motor activities.     General Comments General comments (skin  integrity, edema, etc.): Pt is up to transfer with assistance, aware of hand placement and requires cues for steps to pivot      Pertinent Vitals/Pain Pain Assessment: Faces Faces Pain Scale: Hurts little more Pain Location: R leg with effort Pain Intervention(s): Monitored during session;Premedicated before session;Repositioned    Home Living                      Prior Function            PT Goals (current goals can now be found in the care plan section) Acute Rehab PT Goals Patient Stated Goal: to get home  again Progress towards PT goals: Progressing toward goals    Frequency    Min 4X/week      PT Plan Current plan remains appropriate    Co-evaluation              AM-PAC PT "6 Clicks" Daily Activity  Outcome Measure  Difficulty turning over in bed (including adjusting bedclothes, sheets and blankets)?: A Little Difficulty moving from lying on back to sitting on the side of the bed? : Unable Difficulty sitting down on and standing up from a chair with arms (e.g., wheelchair, bedside commode, etc,.)?: A Lot Help needed moving to and from a bed to chair (including a wheelchair)?: A Little Help needed walking in hospital room?: Total Help needed climbing 3-5 steps with a railing? : Total 6 Click Score: 11    End of Session Equipment Utilized During Treatment: Gait belt Activity Tolerance: Patient tolerated treatment well Patient left: in chair;with call bell/phone within reach;with family/visitor present Nurse Communication: Mobility status PT Visit Diagnosis: Unsteadiness on feet (R26.81);Muscle weakness (generalized) (M62.81);Other abnormalities of gait and mobility (R26.89)     Time: 1241-1311 PT Time Calculation (min) (ACUTE ONLY): 30 min  Charges:  $Therapeutic Exercise: 8-22 mins $Therapeutic Activity: 8-22 mins                    G Codes:  Functional Assessment Tool Used: AM-PAC 6 Clicks Basic Mobility    Ivar Drape 01/30/2017, 1:32 PM   Samul Dada, PT MS Acute Rehab Dept. Number: [facility] [id number] and [facility] [phone]

## 2017-01-30 NOTE — Progress Notes (Signed)
PT Cancellation Note  Patient Details Name: Dann Magistro MRN: [id number] DOB: Oct 29, 1921   Cancelled Treatment:    Reason Eval/Treat Not Completed: Other (comment). Having therapy with OT and will try again later.   Ivar Drape 01/30/2017, 9:08 AM   Samul Dada, PT MS Acute Rehab Dept. Number: [facility] [id number] and [facility] [phone]

## 2017-01-31 ENCOUNTER — Inpatient Hospital Stay (HOSPITAL_COMMUNITY): Payer: Medicare Other | Admitting: Occupational Therapy

## 2017-01-31 ENCOUNTER — Encounter (HOSPITAL_COMMUNITY): Payer: Self-pay | Admitting: *Deleted

## 2017-01-31 ENCOUNTER — Inpatient Hospital Stay (HOSPITAL_COMMUNITY): Payer: Medicare Other | Admitting: Speech Pathology

## 2017-01-31 ENCOUNTER — Inpatient Hospital Stay (HOSPITAL_COMMUNITY): Payer: Medicare Other | Admitting: Physical Therapy

## 2017-01-31 DIAGNOSIS — G8191 Hemiplegia, unspecified affecting right dominant side: Secondary | ICD-10-CM

## 2017-01-31 DIAGNOSIS — I6310 Cerebral infarction due to embolism of unspecified precerebral artery: Secondary | ICD-10-CM

## 2017-01-31 LAB — CBC WITH DIFFERENTIAL/PLATELET
BASOS PCT: 1 %
Basophils Absolute: 0 10*3/uL (ref 0.0–0.1)
EOS ABS: 0.2 10*3/uL (ref 0.0–0.7)
EOS PCT: 3 %
HCT: 40.2 % (ref 36.0–46.0)
HEMOGLOBIN: 13.1 g/dL (ref 12.0–15.0)
Lymphocytes Relative: 33 %
Lymphs Abs: 2.1 10*3/uL (ref 0.7–4.0)
MCH: 29.1 pg (ref 26.0–34.0)
MCHC: 32.6 g/dL (ref 30.0–36.0)
MCV: 89.3 fL (ref 78.0–100.0)
MONOS PCT: 7 %
Monocytes Absolute: 0.4 10*3/uL (ref 0.1–1.0)
NEUTROS PCT: 56 %
Neutro Abs: 3.6 10*3/uL (ref 1.7–7.7)
PLATELETS: 166 10*3/uL (ref 150–400)
RBC: 4.5 MIL/uL (ref 3.87–5.11)
RDW: 12.4 % (ref 11.5–15.5)
WBC: 6.3 10*3/uL (ref 4.0–10.5)

## 2017-01-31 LAB — COMPREHENSIVE METABOLIC PANEL
ALBUMIN: 3.3 g/dL — AB (ref 3.5–5.0)
ALT: 31 U/L (ref 14–54)
ANION GAP: 9 (ref 5–15)
AST: 40 U/L (ref 15–41)
Alkaline Phosphatase: 68 U/L (ref 38–126)
BUN: 15 mg/dL (ref 6–20)
CHLORIDE: 102 mmol/L (ref 101–111)
CO2: 24 mmol/L (ref 22–32)
Calcium: 10.1 mg/dL (ref 8.9–10.3)
Creatinine, Ser: 1.31 mg/dL — ABNORMAL HIGH (ref 0.44–1.00)
GFR calc non Af Amer: 34 mL/min — ABNORMAL LOW (ref >60)
GFR, EST AFRICAN AMERICAN: 39 mL/min — AB (ref >60)
GLUCOSE: 131 mg/dL — AB (ref 65–99)
POTASSIUM: 4.2 mmol/L (ref 3.5–5.1)
SODIUM: 135 mmol/L (ref 135–145)
Total Bilirubin: 0.5 mg/dL (ref 0.3–1.2)
Total Protein: 6.8 g/dL (ref 6.5–8.1)

## 2017-01-31 NOTE — Progress Notes (Signed)
Subjective/Complaints:   Objective: Vital Signs: Blood pressure 124/77, pulse 88, temperature 97.9 F (36.6 C), temperature source Oral, resp. rate 16, height _0  (1.626 m), SpO2 100 %. No results found. Results for orders placed or performed during the hospital encounter of 01/30/17 (from the past 72 hour(s))  CBC WITH DIFFERENTIAL     Status: None   Collection Time: 01/31/17  6:04 AM  Result Value Ref Range   WBC 6.3 4.0 - 10.5 K/uL   RBC 4.50 3.87 - 5.11 MIL/uL   Hemoglobin 13.1 12.0 - 15.0 g/dL   HCT 40.2 36.0 - 46.0 %   MCV 89.3 78.0 - 100.0 fL   MCH 29.1 26.0 - 34.0 pg   MCHC 32.6 30.0 - 36.0 g/dL   RDW 12.4 11.5 - 15.5 %   Platelets 166 150 - 400 K/uL   Neutrophils Relative % 56 %   Neutro Abs 3.6 1.7 - 7.7 K/uL   Lymphocytes Relative 33 %   Lymphs Abs 2.1 0.7 - 4.0 K/uL   Monocytes Relative 7 %   Monocytes Absolute 0.4 0.1 - 1.0 K/uL   Eosinophils Relative 3 %   Eosinophils Absolute 0.2 0.0 - 0.7 K/uL   Basophils Relative 1 %   Basophils Absolute 0.0 0.0 - 0.1 K/uL  Comprehensive metabolic panel     Status: Abnormal   Collection Time: 01/31/17  6:04 AM  Result Value Ref Range   Sodium 135 135 - 145 mmol/L   Potassium 4.2 3.5 - 5.1 mmol/L   Chloride 102 101 - 111 mmol/L   CO2 24 22 - 32 mmol/L   Glucose, Bld 131 (H) 65 - 99 mg/dL   BUN 15 6 - 20 mg/dL   Creatinine, Ser 1.31 (H) 0.44 - 1.00 mg/dL   Calcium 10.1 8.9 - 10.3 mg/dL   Total Protein 6.8 6.5 - 8.1 g/dL   Albumin 3.3 (L) 3.5 - 5.0 g/dL   AST 40 15 - 41 U/L   ALT 31 14 - 54 U/L   Alkaline Phosphatase 68 38 - 126 U/L   Total Bilirubin 0.5 0.3 - 1.2 mg/dL   GFR calc non Af Amer 34 (L) >60 mL/min   GFR calc Af Amer 39 (L) >60 mL/min    Comment: (NOTE) The eGFR has been calculated using the CKD EPI equation. This calculation has not been validated in all clinical situations. eGFR's persistently <60 mL/min signify possible Chronic Kidney Disease.    Anion gap 9 5 - 15     HEENT: normal Cardio:  RRR and no murmur Resp: CTA B/L and unlabored GI: BS positive and NT, ND Extremity:  No Edema Skin:   Other well healed R AKA Neuro: no dysarthria or aphasia, A and O, motor 4-/5 in R delt, bi, tri, grip, 5/5 LUE and LLE, 4/5 R HF, mild fine motor deficits RUE Musc/Skel:  Other R AKA Gen NAD   Assessment/Plan: 1. Functional deficits secondary to Left pontine infarct which require 3+ hours per day of interdisciplinary therapy in a comprehensive inpatient rehab setting. Physiatrist is providing close team supervision and 24 hour management of active medical problems listed below. Physiatrist and rehab team continue to assess barriers to discharge/monitor patient progress toward functional and medical goals. FIM:       Function - Toileting Toileting steps completed by patient: Adjust clothing prior to toileting, Performs perineal hygiene, Adjust clothing after toileting Toileting Assistive Devices: Grab bar or rail Assist level: Touching or steadying assistance (Pt.75%)  Function -  Air cabin crew transfer assistive device: Elevated toilet seat/BSC over toilet Assist level to toilet: Touching or steadying assistance (Pt > 75%) Assist level from toilet: Touching or steadying assistance (Pt > 75%)  Function - Chair/bed transfer Chair/bed transfer method: Stand pivot Chair/bed transfer assist level: Touching or steadying assistance (Pt > 75%)     Function - Comprehension Comprehension: Auditory Comprehension assist level: Follows complex conversation/direction with extra time/assistive device  Function - Expression Expression: Verbal Expression assist level: Expresses complex ideas: With extra time/assistive device  Function - Social Interaction Social Interaction assist level: Interacts appropriately with others - No medications needed.  Function - Problem Solving Problem solving assist level: Solves complex problems: With extra time  Function - Memory Memory  assist level: Complete Independence: No helper Patient normally able to recall (first 3 days only): Current season, Location of own room, Staff names and faces, That he or she is in a hospital  Medical Problem List and Plan: 1. Right hemiparesissecondary to left pontine infarct. Hx of right AKA -CIR PT, OT evals -Not a prosthetic user AKA 2013   2. DVT Prophylaxis/Anticoagulation: Pharmaceutical: Lovenox 3. Chronic HA/back pain/Pain Management: cymbalta--unable to tolerate higher doses.  4. Mood: LCSW to follow for evaluation and support.  5. Neuropsych: This patient is capable of making decisions on herown behalf. 6. Skin/Wound Care: routine pressure relief measures. Maintain adequate nutritional and hydration status.  7. Fluids/Electrolytes/Nutrition: Monitor I/O. Check lytes in am.  8. T2DM: Monitor BS ac/hs. 9. HTN: Monitor BP bid--goal 130-160 to avoid hypoperfusion due to severe ICA stenosis. 10. CKD: Monitor intake-- avoid nephrotoxic medications.  11. Peripheral neuropathy/phantom pain: On gabapentin and duloxetine.  -pt does not wish to change dose 12. IBS-Chronic constipation: Change miralax (causing bloating/diarrhea) to Senna S.   LOS (Days) 1 A FACE TO FACE EVALUATION WAS PERFORMED  KIRSTEINS,ANDREW E 01/31/2017, 8:07 AM

## 2017-01-31 NOTE — Evaluation (Signed)
Speech Language Pathology Assessment and Plan  Patient Details  Name: Carolyn Werner MRN: [id number] Date of Birth: [birth year]   Today's Date: 01/31/2017 SLP Individual Time: 1304-1400 SLP Individual Time Calculation (min): 56 min   Problem List:  Patient Active Problem List   Diagnosis Date Noted  . Embolic stroke ([place]) 62/26/3335  . Ataxia, post-stroke   . Diabetes mellitus type 2 in nonobese ([name])   . Stage 3 chronic kidney disease   . History of right above knee amputation ([place])   . History of CVA (cerebrovascular accident)   . Benign essential HTN   . Transient cerebral ischemia   . Cerebral thrombosis with cerebral infarction 01/28/2017  . Right arm weakness 01/26/2017  . Weakness 01/26/2017  . Hyperlipemia 10/05/2016  . Weakness of both arms   . Left pontine stroke ([place]) 07/28/2016  . Cerebral infarction due to occlusion of left vertebral artery ([place])   . Slurred speech 07/27/2016  . CKD (chronic kidney disease) stage 3, GFR 30-59 ml/min 07/27/2016  . Diabetes mellitus ([place]) 07/27/2016  . Hypertension 07/27/2016  . Depression 07/27/2016  . Glaucoma 07/27/2016  . Neuropathic pain 07/27/2016  . Asthma 07/27/2016  . Heart palpitations 07/05/2016  . Cardiac murmur 07/05/2016  . Phantom limb pain ([place]) 11/13/2014  . Cervical disc disorder with radiculopathy of cervical region 11/13/2014   Past Medical History:  Past Medical History:  Diagnosis Date  . DDD (degenerative disc disease), cervical   . Diabetes mellitus without complication ([place])   . Headache   . Hx of benign neoplasm of pituitary gland   . IBS (irritable colon syndrome)-C   . Mild persistent asthma   . PVD (peripheral vascular disease) ([place])   . Shingles   . Stroke [facility])    Past Surgical History:  Past Surgical History:  Procedure Laterality Date  . ABOVE KNEE LEG AMPUTATION Right 2013  . APPENDECTOMY    . BYPASS GRAFT     ileofemoral   . PITUITARY EXCISION    . TONSILLECTOMY       Assessment / Plan / Recommendation Clinical Impression   Carolyn Washingtonis a 83 y.o.RH-femalewho was admitted on 01/26/17 with increasing right sided weakness and slurred speech when getting up from sleep. MRI/MRA head done revealing acute infarct in left paramedian pons  Patient with improving symptoms post admission.  Pt admitted to CIR and SLP evaluation was completed on 01/31/2017 with the following results:  Pt presents with grossly intact cognitive-linguistic function for age.   Oral motor exam reveals mild right sided lingual weakness which did not impact speech intelligibility during conversations.  Pt's speech was fluent and free from word finding impairment.  As a result, no further ST needs are indicated at this time.     Skilled Therapeutic Interventions          Cognitive-linguistic evaluation completed with results and recommendations reviewed with patient.     SLP Assessment  Patient does not need any further Speech Lanaguage Pathology Services    Recommendations  Follow up Recommendations: None Equipment Recommended: None recommended by SLP           Pain Pain Assessment Pain Assessment: No/denies pain  Prior Functioning Cognitive/Linguistic Baseline: Within functional limits (for age ) Type of Home: Apartment  Lives With: Daughter Available Help at Discharge: Personal care attendant;Available PRN/intermittently;Family Vocation: Retired  Function:  Eating Eating                 Cognition Comprehension Comprehension assist level: Follows  complex conversation/direction with extra time/assistive device  Expression   Expression assist level: Expresses complex ideas: With extra time/assistive device  Social Interaction Social Interaction assist level: Interacts appropriately with others - No medications needed.  Problem Solving Problem solving assist level: Solves basic problems with no assist  Memory Memory assist level: Recognizes or recalls 90% of  the time/requires cueing < 10% of the time    Recommendations for other services: None   Discharge Criteria: Patient will be discharged from SLP if patient refuses treatment 3 consecutive times without medical reason, if treatment goals not met, if there is a change in medical status, if patient makes no progress towards goals or if patient is discharged from hospital.  The above assessment, treatment plan, treatment alternatives and goals were discussed and mutually agreed upon: by patient  Tylene Quashie, Selinda Orion 01/31/2017, 2:28 PM

## 2017-01-31 NOTE — Evaluation (Signed)
Occupational Therapy Assessment and Plan  Patient Details  Name: Carolyn Werner MRN: [id number] Date of Birth: June 10, 1921  OT Diagnosis: hemiplegia affecting dominant side and muscle weakness (generalized) Rehab Potential: Rehab Potential (ACUTE ONLY): Excellent ELOS: 7-10 days   Today's Date: 01/31/2017 OT Individual Time: 1400-1500 OT Individual Time Calculation (min): 60 min     Problem List:  Patient Active Problem List   Diagnosis Date Noted  . Embolic stroke ([place]) 22/63/3354  . Ataxia, post-stroke   . Diabetes mellitus type 2 in nonobese ([name])   . Stage 3 chronic kidney disease   . History of right above knee amputation ([place])   . History of CVA (cerebrovascular accident)   . Benign essential HTN   . Transient cerebral ischemia   . Cerebral thrombosis with cerebral infarction 01/28/2017  . Right arm weakness 01/26/2017  . Weakness 01/26/2017  . Hyperlipemia 10/05/2016  . Weakness of both arms   . Left pontine stroke ([place]) 07/28/2016  . Cerebral infarction due to occlusion of left vertebral artery ([place])   . Slurred speech 07/27/2016  . CKD (chronic kidney disease) stage 3, GFR 30-59 ml/min 07/27/2016  . Diabetes mellitus ([place]) 07/27/2016  . Hypertension 07/27/2016  . Depression 07/27/2016  . Glaucoma 07/27/2016  . Neuropathic pain 07/27/2016  . Asthma 07/27/2016  . Heart palpitations 07/05/2016  . Cardiac murmur 07/05/2016  . Phantom limb pain ([place]) 11/13/2014  . Cervical disc disorder with radiculopathy of cervical region 11/13/2014    Past Medical History:  Past Medical History:  Diagnosis Date  . DDD (degenerative disc disease), cervical   . Diabetes mellitus without complication ([place])   . Headache   . Hx of benign neoplasm of pituitary gland   . IBS (irritable colon syndrome)-C   . Mild persistent asthma   . PVD (peripheral vascular disease) ([place])   . Shingles   . Stroke [facility])    Past Surgical History:  Past Surgical History:  Procedure Laterality  Date  . ABOVE KNEE LEG AMPUTATION Right 2013  . APPENDECTOMY    . BYPASS GRAFT     ileofemoral   . PITUITARY EXCISION    . TONSILLECTOMY      Assessment & Plan Clinical Impression: Carolyn Werner a 83 y.o.RH-femalewith history of T2DM with peripheral neuropathy, CKD-baseline SCr-1.3, R-AKA- wheelchair bound, asthma, chronic back pain/HA, bilateral cerebellar/pontine strokes 07/2016 due to high grade L-ICA stenosis; who was admitted on 01/26/17 with increasing right sided weakness and slurred speech when getting up from sleep. 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 Patient with improving symptoms post admission. CTA Head/neck recommended but family declined. 2D echo with EF 60-65% with grade one diastolic dysfunction, calcified mitral annulus and lipomatous atrial septal hypertrophy. Dr. Leonie Man recommended ASA and Brilinta for stroke prophylaxis and BP goal 130-160 due to severe vascular stenosis.  Patient transferred to CIR on 01/30/2017 .    Patient currently requires min with basic self-care skills secondary to muscle weakness, decreased cardiorespiratoy endurance, decreased coordination and decreased sitting balance, decreased standing balance, decreased postural control and hemiplegia.  Prior to hospitalization, patient could complete ADLs with supervision- mod I.  Patient will benefit from skilled intervention to decrease level of assist with basic self-care skills and increase independence with basic self-care skills prior to discharge home with care partner.  Anticipate patient will require intermittent supervision and follow up home health.  OT -  End of Session Activity Tolerance: Tolerates 30+ min activity with multiple rests Endurance Deficit: Yes Endurance Deficit Description: Rest breaks throughout sesssion, R UE  fatigues quickly OT Assessment Rehab Potential (ACUTE ONLY): Excellent OT Patient demonstrates impairments in the following area(s): Balance;Endurance;Safety;Motor OT Basic ADL's Functional Problem(s): Eating;Grooming;Bathing;Dressing;Toileting OT Transfers Functional Problem(s): Toilet;Tub/Shower OT Additional Impairment(s): Fuctional Use of Upper Extremity OT Plan OT Intensity: Minimum of 1-2 x/day, 45 to 90 minutes OT Frequency: 5 out of 7 days OT Duration/Estimated Length of Stay: 7-10 days OT Treatment/Interventions: Balance/vestibular training;Discharge planning;Disease Lawyer;Functional electrical stimulation;Functional mobility training;Neuromuscular re-education;Pain management;Patient/family education;Psychosocial support;Self Care/advanced ADL retraining;Skin care/wound managment;Splinting/orthotics;Therapeutic Activities;Therapeutic Exercise;UE/LE Strength taining/ROM;UE/LE Coordination activities OT Self Feeding Anticipated Outcome(s): Mod I OT Basic Self-Care Anticipated Outcome(s): Supervision OT Toileting Anticipated Outcome(s):  Mod I OT Bathroom Transfers Anticipated Outcome(s): Supervision- mod I OT Recommendation Recommendations for Other Services: Therapeutic Recreation consult Therapeutic Recreation Interventions: Pet therapy;Stress management Patient destination: Home Follow Up Recommendations: Home health OT Equipment Details: Pt has tub transfer bench   Skilled Therapeutic Intervention Pt seen for OT eval and session focusing on functional transfers. Pt sitting up in w/c upon arrival, agreeable to tx session.  She declined bathing/dressing this session.  Completed stand pivot transfers throughout session for w/c <> toilet and simulated tub/shower transfer w/c <> tub bench, all transfers completed with steadying assist.  She completed 9 hole peg test,  See results below. Pt returned to room at end of session,  daughter present. Educated both regarding role of OT, POC, OT/PT goals, and d/c planning. Pt left seated in w/c at end of session, all needs in reach.   OT Evaluation Precautions/Restrictions  Precautions Precautions: Fall Restrictions Weight Bearing Restrictions: No General Chart Reviewed: Yes Additional Pertinent History: hx of R AKA Pain Pain Assessment Pain Assessment: No/denies pain Home Living/Prior Functioning Home Living Family/patient expects to be discharged to:: Private residence Living Arrangements: Children Available Help at Discharge: Personal care attendant, Available PRN/intermittently, Family Type of Home: Apartment Home Access: Ramped entrance Home Layout: One level Bathroom Shower/Tub: Tub/shower unit, Architectural technologist: Standard Bathroom Accessibility: Yes (accessible for w/c and pt only no room for caregiver)  Lives With: Daughter IADL History Homemaking Responsibilities: No Occupation: Retired Type of Occupation: Retired Therapist, nutritional Level of Independence: Independent with basic ADLs, Independent with transfers, Needs assistance with ADLs, Needs assistance with homemaking  Able to [place]?: No Driving: No Vocation: Retired Leisure: Hobbies-yes (Comment) (cross word puzzles) Comments: 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  Vision Baseline Vision/History: Glaucoma (L eye) Patient Visual Report: No change from baseline Vision Assessment?: No apparent visual deficits Additional Comments: L eye blurriness, no change from baseline per pt report Perception  Perception: Within Functional Limits Praxis Praxis: Intact Cognition Overall Cognitive Status: Within Functional Limits for tasks assessed Arousal/Alertness: Awake/alert Orientation Level: Person;Place;Situation Person: Oriented Place: Oriented Situation: Oriented Year: 2018 Month: August Day of Week: Incorrect (Tuesday) Memory: Appears intact Immediate Memory Recall:  Sock;Blue;Bed Memory Recall: Sock;Blue;Bed Memory Recall Sock: Without Cue Memory Recall Blue: Without Cue Memory Recall Bed: Without Cue Sustained Attention: Appears intact Awareness: Appears intact Problem Solving: Appears intact Safety/Judgment: Appears intact Sensation Sensation Light Touch: Appears Intact Proprioception: Appears Intact Coordination Gross Motor Movements are Fluid and Coordinated: Yes Fine Motor Movements are Fluid and Coordinated: No Finger Nose Finger Test: decreased speed and accuracy R UE 9 Hole Peg Test: R: 1 min 6sec, 1  min 3 sec  L: 40 seconds  and 42 seconds  Motor  Motor Motor: Hemiplegia Motor - Skilled Clinical Observations: mild RUE hemiplegia. report "heaviness" in distal UE Trunk/Postural Assessment  Cervical Assessment Cervical Assessment: Within Functional Limits Thoracic Assessment Thoracic Assessment: Exceptions to [facility] (Kyphotic) Lumbar Assessment Lumbar Assessment: Exceptions to [facility] (Posterior pelvic tilt) Postural Control Postural Control: Deficits on evaluation (Impaired due to hx of R amputation)  Balance Balance Balance Assessed: Yes Static Sitting Balance Static Sitting - Balance Support: Feet supported Static Sitting - Level of Assistance: 6: Modified independent (Device/Increase time) Dynamic Sitting Balance Dynamic Sitting - Balance Support: During functional activity Dynamic Sitting - Level of Assistance: 5: Stand by assistance Static Standing Balance Static Standing - Balance Support: During functional activity;Left upper extremity supported Static Standing - Level of Assistance: 5: Stand by assistance (With UE support) Dynamic Standing Balance Dynamic Standing - Balance Support: During functional activity;Left upper extremity supported Dynamic Standing - Level of Assistance: 4: Min assist Extremity/Trunk Assessment RUE Assessment RUE Assessment: Exceptions to WFL (4-/5 strength throughout) LUE Assessment LUE  Assessment: Within Functional Limits   See Function Navigator for Current Functional Status.   Refer to Care Plan for Long Term Goals  Recommendations for other services: Therapeutic Recreation  Pet therapy   Discharge Criteria: Patient will be discharged from OT if patient refuses treatment 3 consecutive times without medical reason, if treatment goals not met, if there is a change in medical status, if patient makes no progress towards goals or if patient is discharged from hospital.  The above assessment, treatment plan, treatment alternatives and goals were discussed and mutually agreed upon: by patient  Ernestina Patches 01/31/2017, 3:38 PM

## 2017-01-31 NOTE — Evaluation (Signed)
Physical Therapy Assessment and Plan  Patient Details  Name: Carolyn Werner MRN: [id number] Date of Birth: [birth year]  PT Diagnosis: Abnormality of gait, Difficulty walking and Hemiplegia dominant Rehab Potential: Good ELOS: 7-10 days    Today's Date: 01/31/2017 PT Individual Time: 0801-0916 PT Individual Time Calculation (min): 75 min    Problem List:  Patient Active Problem List   Diagnosis Date Noted  . Embolic stroke ([place]) 37/34/2876  . Ataxia, post-stroke   . Diabetes mellitus type 2 in nonobese ([name])   . Stage 3 chronic kidney disease   . History of right above knee amputation ([place])   . History of CVA (cerebrovascular accident)   . Benign essential HTN   . Transient cerebral ischemia   . Cerebral thrombosis with cerebral infarction 01/28/2017  . Right arm weakness 01/26/2017  . Weakness 01/26/2017  . Hyperlipemia 10/05/2016  . Weakness of both arms   . Left pontine stroke ([place]) 07/28/2016  . 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 Slurred speech 07/27/2016  . CKD (chronic kidney disease) stage 3, GFR 30-59 ml/min 07/27/2016  . Diabetes mellitus ([place]) 07/27/2016  . Hypertension 07/27/2016  . Depression 07/27/2016  . Glaucoma 07/27/2016  . Neuropathic pain 07/27/2016  . Asthma 07/27/2016  . Heart palpitations 07/05/2016  . Cardiac murmur 07/05/2016  . Phantom limb pain ([place]) 11/13/2014  . Cervical disc disorder with radiculopathy of cervical region 11/13/2014    Past Medical History:  Past Medical History:  Diagnosis Date  . DDD (degenerative disc disease), cervical   . Diabetes mellitus without complication ([place])   . Headache   . Hx of benign neoplasm of pituitary gland   . IBS (irritable colon syndrome)-C   . Mild persistent asthma   . PVD (peripheral vascular disease) ([place])   . Shingles   . Stroke [facility])    Past Surgical History:  Past Surgical History:  Procedure Laterality Date  . ABOVE KNEE LEG AMPUTATION Right  2013  . APPENDECTOMY    . BYPASS GRAFT     ileofemoral   . PITUITARY EXCISION    . TONSILLECTOMY      Assessment & Plan Clinical Impression: Patient is a 83 y.o.RH-femalewith history of T2DM with peripheral neuropathy, CKD-baseline SCr-1.3, R-AKA- wheelchair bound, asthma, chronic back pain/HA, bilateral cerebellar/pontine strokes 07/2016 due to high grade L-ICA stenosis; who was admitted on 01/26/17 with increasing right sided weakness and slurred speech when getting up from sleep. 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 Patient with improving symptoms post admission. CTA Head/neck recommended but family declined. 2D echo with EF 60-65% with grade one diastolic dysfunction, calcified mitral annulus and lipomatous atrial septal hypertrophy. Dr. Leonie Man recommended ASA and Brilinta for stroke prophylaxis and BP goal 130-160 due to severe vascular stenosis.   Patient transferred to CIR on 01/30/2017 .   Patient currently requires min with mobility secondary to muscle weakness, decreased coordination and decreased standing balance, hemiplegia and decreased balance strategies.  Prior to hospitalization, patient was modified independent  with mobility and lived with Daughter in a [place] home.  Home access is  Ramped entrance.  Patient will benefit from skilled PT intervention to maximize safe functional mobility, minimize fall risk and decrease caregiver burden for planned discharge home with intermittent assist.  Anticipate patient will benefit from follow up [facility] at discharge.  PT - End  of Session Activity Tolerance: Tolerates 10 - 20 min activity with multiple rests Endurance Deficit: Yes PT Assessment Rehab Potential (ACUTE/IP ONLY): Good PT Barriers to Discharge: Decreased caregiver support;Home environment access/layout PT Patient  demonstrates impairments in the following area(s): Balance;Endurance;Motor;Safety;Skin Integrity PT Transfers Functional Problem(s): Bed Mobility;Bed to Chair;Car;Furniture;Floor PT Locomotion Functional Problem(s): Ambulation;Wheelchair Mobility;Stairs PT Plan PT Intensity: Minimum of 1-2 x/day ,45 to 90 minutes PT Frequency: 5 out of 7 days PT Duration Estimated Length of Stay: 7-10 days  PT Treatment/Interventions: Balance/vestibular training;Cognitive remediation/compensation;Community reintegration;Discharge planning;Disease management/prevention;Ambulation/gait training;DME/adaptive equipment instruction;Functional mobility training;Functional electrical stimulation;Neuromuscular re-education;Patient/family education;Psychosocial support;Pain management;Therapeutic Exercise;Splinting/orthotics;Stair training;Therapeutic Activities;Wheelchair propulsion/positioning;UE/LE Strength taining/ROM;UE/LE Coordination activities;Skin care/wound management;Visual/perceptual remediation/compensation PT Transfers Anticipated Outcome(s): Mod I Squat pivot transfers  PT Locomotion Anticipated Outcome(s): Mod I at [facility] level in home  PT Recommendation Recommendations for Other Services: Therapeutic Recreation consult Therapeutic Recreation Interventions: Clinical cytogeneticist;Other (comment) Follow Up Recommendations: Home health PT Patient destination: Home Equipment Recommended: To be determined Equipment Details: pt has WC   Skilled Therapeutic Intervention PT instructed patient in PT Evaluation and initiated treatment intervention; see below for results. PT educated patient in [place], rehab potential, rehab goals, and discharge recommendations. PT instructed in sit<.>stand and squat pivot trasnfers including car transfer with min assist throughout tx. Pt declined ambulation due to shoulder pain with fear of falling, and reports being non-ambulatory since 2013.  Patient returned too room and left  sitting in [facility] with call bell in reach and all needs met.      PT Evaluation Precautions/Restrictions   General   Vital SignsTherapy Vitals Temp: 97.9 F (36.6 C) Temp Source: Oral Pulse Rate: 88 Resp: 16 BP: 124/77 Patient Position (if appropriate): Lying Oxygen Therapy SpO2: 100 % O2 Device: Nasal Cannula Pain   Home Living/Prior Functioning Home Living Available Help at Discharge: Personal care attendant;Available PRN/intermittently;Family Type of Home: Apartment Home Access: Ramped entrance Home Layout: One level Bathroom Shower/Tub: Tub/shower unit;Curtain Biochemist, clinical: Standard Bathroom Accessibility: Yes (has limited help due to small BR area, wc and pt only)  Lives With: Daughter Prior Function Level of Independence: Requires assistive device for independence;Needs assistance with ADLs;Needs assistance with homemaking  Able to Take Stairs?: No Driving: No Vocation: 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 no change from baseline per pt report.  Perception Perception: Within Functional Limits Praxis Praxis: Intact  Cognition Overall Cognitive Status: Within Functional Limits for tasks assessed Sustained Attention: Appears intact Memory: Appears intact Awareness: Appears intact Problem Solving: Appears intact Safety/Judgment: Appears intact Sensation Sensation Light Touch: Appears Intact Proprioception: Appears Intact Coordination Gross Motor Movements are Fluid and Coordinated: Yes Finger Nose Finger Test: delayed speed.  Motor  Motor Motor: Hemiplegia Motor - Skilled Clinical Observations: mild RUE hemiplegia. report "heaviness" in distal UE  Mobility Bed Mobility Bed Mobility: Rolling Right;Rolling Left;Supine to Sit;Sit to Supine Rolling Right: 4: Min assist (with bed rail ) Rolling Left: 5: Supervision (with bed rail ) Supine to Sit: 5:  Supervision Supine to Sit Details: Verbal cues for technique;Verbal cues for precautions/safety;Verbal cues for sequencing Sit to Supine: 4: Min assist Sit to Supine - Details: Verbal cues for precautions/safety;Verbal cues for sequencing Transfers Transfers: Yes Sit to Stand: From bed;With upper extremity assist;4: Min guard Sit to Stand Details: Verbal cues for sequencing;Verbal cues for technique;Verbal cues for precautions/safety;Tactile cues for posture Squat Pivot Transfers: 4: Min Risk manager Details: Verbal cues for  technique;Verbal cues for precautions/safety;Tactile cues for placement Locomotion  Ambulation Ambulation: No Gait Gait: No Stairs / Additional Locomotion Stairs: No Architect: Yes Wheelchair Assistance: 5: Careers information officer: Both upper extremities Wheelchair Parts Management: Supervision/cueing Distance: 141f  Trunk/Postural Assessment  Cervical Assessment Cervical Assessment: Within FScientist, physiologicalAssessment: Exceptions to [facility](Kyphotic) Lumbar Assessment Lumbar Assessment: Exceptions to [facility] [place](Posterior pelvic tilt) Postural Control Postural Control: Deficits on evaluation (Impaired due to hx of R amputation)  Balance Balance Balance Assessed: Yes Static Sitting Balance Static Sitting - Level of Assistance: 6: Modified independent (Device/Increase time) Dynamic Sitting Balance Dynamic Sitting - Level of Assistance: 5: Stand by assistance Static Standing Balance Static Standing - Level of Assistance: 5: Stand by assistance (with BUE on arm rests) Dynamic Standing Balance Dynamic Standing - Level of Assistance: 4: Min assist (reaching tasks with LUE and RUE supported on Arm rest) Extremity Assessment      RLE Assessment RLE Assessment: Exceptions to [facility] Strength RLE Overall Strength Comments: R AKA. Hip flexion 4+/5 hip adduction/abduction and  extension 4/5  LLE Assessment LLE Assessment: Exceptions to [facility] DiseasesLLE Strength LLE Overall Strength Comments: Grossly 4/5 to 4+/5 proximal to distal   See Function Navigator for Current Functional Status.   Refer to Care Plan for Long Term Goals  Recommendations for other services: Therapeutic Recreation  Kitchen group and Stress management  Discharge Criteria: Patient will be discharged from PT if patient refuses treatment 3 consecutive times without medical reason, if treatment goals not met, if there is a change in medical status, if patient makes no progress towards goals or if patient is discharged from hospital.  The above assessment, treatment plan, treatment alternatives and goals were discussed and mutually agreed upon: by patient  ALorie Phenix8/29/2018, 9:18 AM

## 2017-01-31 NOTE — Progress Notes (Signed)
Social Work Assessment and Plan Social Work Assessment and Plan  Patient Details  Name: Carolyn Werner MRN: [id number] Date of Birth: 05/13/1922  Today's Date: 01/31/2017  Problem List:  Patient Active Problem List   Diagnosis Date Noted  . Embolic stroke ([name]) 01/30/2017  . Ataxia, post-stroke   . Diabetes mellitus type 2 in nonobese ([name])   . Stage 3 chronic kidney disease   . History of right above knee amputation ([name])   . History of CVA (cerebrovascular accident)   . Benign essential HTN   . Transient cerebral ischemia   . Cerebral thrombosis with cerebral infarction 01/28/2017  . Right arm weakness 01/26/2017  . Weakness 01/26/2017  . Hyperlipemia 10/05/2016  . Weakness of both arms   . Left pontine stroke ([name]) 07/28/2016  . 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 Slurred speech 07/27/2016  . CKD (chronic kidney disease) stage 3, GFR 30-59 ml/min 07/27/2016  . Diabetes mellitus ([name]) 07/27/2016  . Hypertension 07/27/2016  . Depression 07/27/2016  . Glaucoma 07/27/2016  . Neuropathic pain 07/27/2016  . Asthma 07/27/2016  . Heart palpitations 07/05/2016  . Cardiac murmur 07/05/2016  . Phantom limb pain ([name]) 11/13/2014  . Cervical disc disorder with radiculopathy of cervical region 11/13/2014   Past Medical History:  Past Medical History:  Diagnosis Date  . DDD (degenerative disc disease), cervical   . Diabetes mellitus without complication ([name])   . Headache   . Hx of benign neoplasm of pituitary gland   . IBS (irritable colon syndrome)-C   . Mild persistent asthma   . PVD (peripheral vascular disease) ([name])   . Shingles   . Stroke [facility]([name])    Past Surgical History:  Past Surgical History:  Procedure Laterality Date  . ABOVE KNEE LEG AMPUTATION Right 2013  . APPENDECTOMY    . BYPASS GRAFT     ileofemoral   . PITUITARY EXCISION    . TONSILLECTOMY     Social History:  reports that she quit smoking about 43 years ago. She  has a 5.75 pack-year smoking history. She has never used smokeless tobacco. She reports that she does not drink alcohol or use drugs.  Family / Support Systems Marital Status: Widow/Widower Patient Roles: Parent Children: Kathryn-daughter [phone] Other Supports: Amadeo GarnetFrenchie Bogier-friend [phone] Anticipated Caregiver: Daughter and hired caregivers through the [employer] ( Risk analystComfort Keepers) Ability/Limitations of Caregiver: Daughter works and pt has 9 hrs of CNA along with tow days of adult day care Caregiver Availability: Other (Comment) (Await progress she doesn't have 24 hr care) Family Dynamics: Close with her daughter whom she lives with and her friend. She voiced at my age it is hard everyone is gone. Pt does her best and wants to get her hand back to be able to knit again. She feels she has good supports.  Social History Preferred language: English Religion:  Cultural Background: No issues Education: RN school Read: Yes Write: Yes Employment Status: Retired Date Retired/Disabled/Unemployed: retired Materials engineerN Legal Hisotry/Current Legal Issues: No issues Guardian/Conservator: None-according to MD pt is capable of making her own decisions while here. Daughter is here daily to visit.   Abuse/Neglect Physical Abuse: Denies Verbal Abuse: Denies Sexual Abuse: Denies Exploitation of patient/patient's resources: Denies Self-Neglect: Denies  Emotional Status Pt's affect, behavior adn adjustment status: Pt is motivated to get her hand and arm function back, this is the only area affected by her stroke.  She will do her part, she wants to be as independent  as possible and get back to waht she was doing for herself. She feels with time she will get there. Recent Psychosocial Issues: other health issues-essentially wheelchair bound due to AKA. Pyschiatric History: No history deferred depression screen due to doing well and ajdusting to the new unit and is ready to work in therapies. Pt is a  retired Charity fundraiser and is well aware of her medical issues. She has a strong faith also. Substance Abuse History: No issues  Patient / Family Perceptions, Expectations & Goals Pt/Family understanding of illness & functional limitations: Pt and daughter can explain her stroke and deficits as a result of this last stroke. Both talk with the MD's and feel their questions and concerns are being addressed. Pt will do what is needed to recover and get her function back in her hand. Premorbid pt/family roles/activities: Mom, retiree, friend, church member, etc Anticipated changes in roles/activities/participation: resume Pt/family expectations/goals: Pt states: " I want to be able to crochet again, I need to get my use fo my hand back to do this."  Daughter states: " She is a Investment banker, corporate, always has been and always will be."  [employer]: Other (Comment) (Adult Day Care-2x week & Aide 9 hrs per week) Premorbid Home Care/DME Agencies: Other (Comment) (has all DME) Transportation available at discharge: Daughter and aide provide this Resource referrals recommended: Support group (specify)  Discharge Planning Living Arrangements: Children Support Systems: Children, Other relatives, Friends/neighbors, Psychologist, clinical community Type of Residence: Private residence Insurance Resources: [employer], Media planner (specify) Herbalist) Financial Resources: Tree surgeon, Family Support, Other (Comment) Nature conservation officer pension) Surveyor, quantity Screen Referred: No Living Expenses: Lives with family Money Management: Family Does the patient have any problems obtaining your medications?: No Home Management: Daughter does this Patient/Family Preliminary Plans: Return home with daughter who can provide some assist but is not there 24 hr with pt. Hope she can return to the Day Care 2 x week and will have an aide 9 hrs for assist. Have tried to get more hours but not eligible for this, according to pt. Will await  therapy team evaluations and work on discharge plans. Social Work Anticipated Follow Up Needs: HH/OP, Support Group  Clinical Impression Very pleasant patient who looks great for a 83 yo woman. She has always prided herself on her independence and plans on being again. She wants her hand function back so she can crochet again. Aware of team evaluating her And setting goals for her stay here. Will work on discharge plans.  Lucy Chris 01/31/2017, 1:39 PM

## 2017-01-31 NOTE — Patient Care Conference (Signed)
Inpatient RehabilitationTeam Conference and Plan of Care Update Date: 01/31/2017   Time: 11:30 AM    Patient Name: Carolyn Werner      Medical Record Number: [id number]  Date of Birth: 1922/01/10 Sex: Female         Room/Bed: 4M07C/4M07C-01 Payor Info: Payor: MEDICARE / Plan: MEDICARE PART A AND B / Product Type: *No Product type* /    Admitting Diagnosis: CVA  Admit Date/Time:  01/30/2017  4:10 PM Admission Comments: No comment available   Primary Diagnosis:  <principal problem not specified> Principal Problem: <principal problem not specified>  Patient Active Problem List   Diagnosis Date Noted  . Embolic stroke ([name]) 01/30/2017  . Ataxia, post-stroke   . Diabetes mellitus type 2 in nonobese ([name])   . Stage 3 chronic kidney disease   . History of right above knee amputation ([name])   . History of CVA (cerebrovascular accident)   . Benign essential HTN   . Transient cerebral ischemia   . Cerebral thrombosis with cerebral infarction 01/28/2017  . Right arm weakness 01/26/2017  . Weakness 01/26/2017  . Hyperlipemia 10/05/2016  . Weakness of both arms   . Left pontine stroke ([name]) 07/28/2016  . 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 Slurred speech 07/27/2016  . CKD (chronic kidney disease) stage 3, GFR 30-59 ml/min 07/27/2016  . Diabetes mellitus ([name]) 07/27/2016  . Hypertension 07/27/2016  . Depression 07/27/2016  . Glaucoma 07/27/2016  . Neuropathic pain 07/27/2016  . Asthma 07/27/2016  . Heart palpitations 07/05/2016  . Cardiac murmur 07/05/2016  . Phantom limb pain ([name]) 11/13/2014  . Cervical disc disorder with radiculopathy of cervical region 11/13/2014    Expected Discharge Date:    Team Members Present: Physician leading conference: Dr. Claudette Laws Social Worker Present: [name], LCSW Nurse Present: Tennis Must, RN PT Present: [name], PT OT Present: [name], OT SLP Present: Jackalyn Lombard, SLP PPS  Coordinator present : [name], RN, CRRN     Current Status/Progress Goal Weekly Team Focus  Medical   OT eval pending, PT eval completed  maintain med stability, home with family assist  initiate therapy program   Bowel/Bladder   Continent of B&B.  Maintain continence.  Monitor need for toileting and assist as necessary.   Swallow/Nutrition/ Hydration             ADL's     eval pending        Mobility     eval pending        Communication             Safety/Cognition/ Behavioral Observations            Pain   No c/o pain.  <3.  Monitor for pain and treat as necessary   Skin   Boggy left heel.  Maintain skin integrity.  Keep heel on pillow while in bed.      *See Care Plan and progress notes for long and short-term goals.     Barriers to Discharge  Current Status/Progress Possible Resolutions Date Resolved   Physician    Other (comments)  balance issues     initiate rehab program, anticipate short LOS      Nursing                  PT  Decreased caregiver support;Home environment access/layout                 OT  SLP                SW                Discharge Planning/Teaching Needs:    Home with daughter who works form home-pt attends adult day care 2x week and has an aide 9 hours per week     Team Discussion:  Evaluations pending-medically looking at BP . Main issue is UE weakness from CVA. Will need to be able to do transfer since alone some during the day.  Revisions to Treatment Plan:  New eval    Continued Need for Acute Rehabilitation Level of Care: The patient requires daily medical management by a physician with specialized training in physical medicine and rehabilitation for the following conditions: Daily direction of a multidisciplinary physical rehabilitation program to ensure safe treatment while eliciting the highest outcome that is of practical value to the patient.: Yes Daily medical management of patient stability for  increased activity during participation in an intensive rehabilitation regime.: Yes Daily analysis of laboratory values and/or radiology reports with any subsequent need for medication adjustment of medical intervention for : Neurological problems  Ionia Schey, Lemar Livings 01/31/2017, 12:03 PM

## 2017-01-31 NOTE — Progress Notes (Signed)
Patient information reviewed and entered into eRehab system by Francyne Arreaga, RN, CRRN, PPS Coordinator.  Information including medical coding and functional independence measure will be reviewed and updated through discharge.     Per nursing patient was given "Data Collection Information Summary for Patients in Inpatient Rehabilitation Facilities with attached "Privacy Act Statement-Health Care Records" upon admission.  

## 2017-01-31 NOTE — Progress Notes (Signed)
Social Work Ramona Slinger, Elveria Risingebecca G, LCSW Social Worker Signed   Patient Care Conference Date of Service: 01/31/2017 12:03 PM      Hide copied text Hover for attribution information Inpatient RehabilitationTeam Conference and Plan of Care Update Date: 01/31/2017   Time: 11:30 AM      Patient Name: Carolyn IvoryJuanita Werner      Medical Record Number: [id number]  Date of Birth: 02/06/1922 Sex: Werner         Room/Bed: 4M07C/4M07C-01 Payor Info: Payor: MEDICARE / Plan: MEDICARE PART A AND B / Product Type: *No Product type* /     Admitting Diagnosis: CVA  Admit Date/Time:  01/30/2017  4:10 PM Admission Comments: No comment available    Primary Diagnosis:  <principal problem not specified> Principal Problem: <principal problem not specified>       Patient Active Problem List    Diagnosis Date Noted  . Embolic stroke ([name]) 01/30/2017  . Ataxia, post-stroke    . Diabetes mellitus type 2 in nonobese ([name])    . Stage 3 chronic kidney disease    . History of right above knee amputation ([name])    . History of CVA (cerebrovascular accident)    . Benign essential HTN    . Transient cerebral ischemia    . Cerebral thrombosis with cerebral infarction 01/28/2017  . Right arm weakness 01/26/2017  . Weakness 01/26/2017  . Hyperlipemia 10/05/2016  . Weakness of both arms    . Left pontine stroke ([name]) 07/28/2016  . 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 Slurred speech 07/27/2016  . CKD (chronic kidney disease) stage 3, GFR 30-59 ml/min 07/27/2016  . Diabetes mellitus ([name]) 07/27/2016  . Hypertension 07/27/2016  . Depression 07/27/2016  . Glaucoma 07/27/2016  . Neuropathic pain 07/27/2016  . Asthma 07/27/2016  . Heart palpitations 07/05/2016  . Cardiac murmur 07/05/2016  . Phantom limb pain ([name]) 11/13/2014  . Cervical disc disorder with radiculopathy of cervical region 11/13/2014      Expected Discharge Date:     Team Members Present: Physician leading  conference: Dr. Claudette LawsAndrew Kirsteins Social Worker Present: Dossie DerBecky Alquan Morrish, LCSW Nurse Present: Tennis MustLuz Rosero, RN PT Present: Teodoro Kilaitlin Penven-Crew, PT OT Present: Kearney HardElisabeth Doe, OT SLP Present: Jackalyn LombardNicole Page, SLP PPS Coordinator present : Tora DuckMarie Noel, RN, CRRN       Current Status/Progress Goal Weekly Team Focus  Medical     OT eval pending, PT eval completed  maintain med stability, home with family assist  initiate therapy program   Bowel/Bladder     Continent of B&B.  Maintain continence.  Monitor need for toileting and assist as necessary.   Swallow/Nutrition/ Hydration               ADL's       eval pending        Mobility       eval pending        Communication               Safety/Cognition/ Behavioral Observations             Pain     No c/o pain.  <3.  Monitor for pain and treat as necessary   Skin     Boggy left heel.  Maintain skin integrity.  Keep heel on pillow while in bed.     *See Care Plan and progress notes for long and short-term goals.      Barriers to Discharge  Current Status/Progress Possible Resolutions Date Resolved   Physician     Other (comments)  balance issues     initiate rehab program, anticipate short LOS      Nursing                 PT  Decreased caregiver support;Home environment access/layout                 OT                 SLP            SW              Discharge Planning/Teaching Needs:    Home with daughter who works form home-pt attends adult day care 2x week and has an aide 9 hours per week     Team Discussion:  Evaluations pending-medically looking at BP . Main issue is UE weakness from CVA. Will need to be able to do transfer since alone some during the day.  Revisions to Treatment Plan:  New eval    Continued Need for Acute Rehabilitation Level of Care: The patient requires daily medical management by a physician with specialized training in physical medicine and rehabilitation for the following conditions: Daily  direction of a multidisciplinary physical rehabilitation program to ensure safe treatment while eliciting the highest outcome that is of practical value to the patient.: Yes Daily medical management of patient stability for increased activity during participation in an intensive rehabilitation regime.: Yes Daily analysis of laboratory values and/or radiology reports with any subsequent need for medication adjustment of medical intervention for : Neurological problems   Lucy Chris 01/31/2017, 12:03 PM           Patient ID: Carolyn Werner   DOB: 1922/03/09, 83 y.o.   MRN: [id number]

## 2017-01-31 NOTE — Care Management Note (Signed)
Inpatient Rehabilitation Center Individual Statement of Services  Patient Name:  Carolyn Werner  Date:  01/31/2017  Welcome to the Inpatient Rehabilitation Center.  Our goal is to provide you with an individualized program based on your diagnosis and situation, designed to meet your specific needs.  With this comprehensive rehabilitation program, you will be expected to participate in at least 3 hours of rehabilitation therapies Monday-Friday, with modified therapy programming on the weekends.  Your rehabilitation program will include the following services:  Physical Therapy (PT), Occupational Therapy (OT), Speech Therapy (ST), 24 hour per day rehabilitation nursing, Case Management (Social Worker), Rehabilitation Medicine, Nutrition Services and Pharmacy Services  Weekly team conferences will be held on Wednesday to discuss your progress.  Your Social Worker will talk with you frequently to get your input and to update you on team discussions.  Team conferences with you and your family in attendance may also be held.  Expected length of stay: 7-10 days Overall anticipated outcome: mod/i wheelchair level  Depending on your progress and recovery, your program may change. Your Social Worker will coordinate services and will keep you informed of any changes. Your Social Worker's name and contact numbers are listed  below.  The following services may also be recommended but are not provided by the Inpatient Rehabilitation Center:    Home Health Rehabiltiation Services  Outpatient Rehabilitation Services    Arrangements will be made to provide these services after discharge if needed.  Arrangements include referral to agencies that provide these services.  Your insurance has been verified to be:  Medicare & [employer] Your primary doctor is:  Lynn Ito  Pertinent information will be shared with your doctor and your insurance company.  Social Worker:  [name], SW [phone] or [phone]  Information discussed with and copy given to patient by: Lucy Chris, 01/31/2017, 12:02 PM

## 2017-02-01 ENCOUNTER — Ambulatory Visit (HOSPITAL_COMMUNITY): Payer: Medicare Other | Admitting: *Deleted

## 2017-02-01 ENCOUNTER — Inpatient Hospital Stay (HOSPITAL_COMMUNITY): Payer: Medicare Other | Admitting: *Deleted

## 2017-02-01 ENCOUNTER — Inpatient Hospital Stay (HOSPITAL_COMMUNITY): Payer: Medicare Other | Admitting: Occupational Therapy

## 2017-02-01 ENCOUNTER — Inpatient Hospital Stay (HOSPITAL_COMMUNITY): Payer: Medicare Other | Admitting: Physical Therapy

## 2017-02-01 MED ORDER — TRAMADOL HCL 50 MG PO TABS: 25.0000 mg | ORAL_TABLET | Freq: Four times a day (QID) | ORAL | Status: DC | PRN

## 2017-02-01 MED ORDER — TRAMADOL HCL 50 MG PO TABS
25.0000 mg | ORAL_TABLET | Freq: Four times a day (QID) | ORAL | Status: DC | PRN
  Administered 2017-02-01 – 2017-02-06 (×6): 25 mg via ORAL
  Filled 2017-02-01 (×6): qty 1

## 2017-02-01 NOTE — IPOC Note (Signed)
Overall Plan of Care [facility](IPOC) Patient Details Name: Carolyn IvoryJuanita Werner MRN: [id number] DOB: 02/23/1922  Admitting Diagnosis: CVA  Hospital Problems: Active Problems:   Embolic stroke [facility]([name])     Functional Problem List: Nursing Endurance, Motor, Safety, Skin Integrity  PT Balance, Endurance, Motor, Safety, Skin Integrity  OT Balance, Endurance, Safety, Motor  SLP    TR Endurance, Motor, Safety       Basic ADL's: OT Eating, Grooming, Bathing, Dressing, Toileting     Advanced  ADL's: OT       Transfers: PT Bed Mobility, Bed to Chair, Car, State Street CorporationFurniture, Civil Service fast streamerloor  OT Toilet, Research scientist (life sciences)Tub/Shower     Locomotion: PT Ambulation, Psychologist, prison and probation servicesWheelchair Mobility, Stairs     Additional Impairments: OT Fuctional Use of Upper Extremity  SLP        TR      Anticipated Outcomes Item Anticipated Outcome  Self Feeding Mod I  Swallowing      Basic self-care  Supervision  Toileting   Mod I   Bathroom Transfers Supervision- mod I  Bowel/Bladder  Continent to bowel and bladder with min. assist.  Transfers  Mod I Squat pivot transfers   Locomotion  [facility]   Communication     Cognition     Pain  Less than 3,on 1 to 10 scale.  Safety/Judgment  Free from falls during her stay in rehab.   Therapy Plan: PT Intensity: Minimum of 1-2 x/day ,45 to 90 minutes PT Frequency: 5 out of 7 days PT Duration Estimated Length of Stay: 7-10 days  OT Intensity: Minimum of 1-2 x/day, 45 to 90 minutes OT Frequency: 5 out of 7 days OT Duration/Estimated Length of Stay: 7-10 days      Team Interventions: Nursing Interventions Patient/Family Education, Disease Management/Prevention, Discharge Planning, Skin Care/Wound Management  PT interventions Balance/vestibular training, Cognitive remediation/compensation, Community reintegration, Discharge planning, Disease management/prevention, Ambulation/gait training, DME/adaptive equipment instruction, Functional mobility training, Functional electrical  stimulation, Neuromuscular re-education, Patient/family education, Psychosocial support, Pain management, Therapeutic Exercise, Splinting/orthotics, Stair training, Therapeutic Activities, Wheelchair propulsion/positioning, UE/LE Strength taining/ROM, UE/LE Coordination activities, Skin care/wound management, Visual/perceptual remediation/compensation  OT Interventions Balance/vestibular training, Discharge planning, Disease mangement/prevention, DME/adaptive equipment instruction, Functional electrical stimulation, Functional mobility training, Neuromuscular re-education, Pain management, Patient/family education, Psychosocial support, Self Care/advanced ADL retraining, Skin care/wound managment, Splinting/orthotics, Therapeutic Activities, Therapeutic Exercise, UE/LE Strength taining/ROM, UE/LE Coordination activities  SLP Interventions    TR Interventions Adaptive equipment instruction, 1:1 session, Warden/rangerBalance/vestibular training, Functional mobility training, Community reintegration, Group participation (Comment), Patient/family education, Therapeutic activities, Recreation/leisure participation, Therapeutic exercise, UE/LE Coordination activities  SW/CM Interventions Discharge Planning, Psychosocial Support, Patient/Family Education   Barriers to Discharge MD  Medical stability, Home enviroment access/loayout and Lack of/limited family support  Nursing      PT Decreased caregiver support, Home environment Best boyaccess/layout    OT      SLP      SW       Team Discharge Planning: Destination: PT-Home ,OT- Home , SLP-  Projected Follow-up: PT-Home health PT, OT-  Home health OT, SLP-None Projected Equipment Needs: PT-To be determined, OT-  , SLP-None recommended by SLP Equipment Details: PT-pt has WC , OT-Pt has tub transfer bench Patient/family involved in discharge planning: PT- Patient,  OT-Patient, Family member/caregiver, SLP-Patient  MD ELOS: 7-11d Medical Rehab Prognosis:   Excellent Assessment:   83 y.o.RH-femalewith history of T2DM with peripheral neuropathy, CKD-baseline SCr-1.3, R-AKA- wheelchair bound, asthma, chronic back pain/HA, bilateral cerebellar/pontine strokes 07/2016 due to high grade L-ICA  stenosis; who was admitted on 01/26/17 with increasing right sided weakness and slurred speech when getting up from sleep. 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 Patient with improving symptoms post admission. CTA Head/neck recommended but family declined. 2D echo with EF 60-65% with grade one diastolic dysfunction, calcified mitral annulus and lipomatous atrial septal hypertrophy.  Now requiring 24/7 Rehab RN,MD, as well as CIR level PT, OT and SLP.  Treatment team will focus on ADLs and mobility with goals set at Mod i See Team Conference Notes for weekly updates to the plan of care

## 2017-02-01 NOTE — Progress Notes (Signed)
Subjective/Complaints: Was tired from therapy yesterday  ROS- no CP, SOB, - N/V/D  Objective: Vital Signs: Blood pressure 119/66, pulse 90, temperature 98.6 F (37 C), temperature source Oral, resp. rate 16, height '5\' 4"'  (1.626 m), weight 58.4 kg (128 lb 11.2 oz), SpO2 99 %. No results found. Results for orders placed or performed during the hospital encounter of 01/30/17 (from the past 72 hour(s))  CBC WITH DIFFERENTIAL     Status: None   Collection Time: 01/31/17  6:04 AM  Result Value Ref Range   WBC 6.3 4.0 - 10.5 K/uL   RBC 4.50 3.87 - 5.11 MIL/uL   Hemoglobin 13.1 12.0 - 15.0 g/dL   HCT 40.2 36.0 - 46.0 %   MCV 89.3 78.0 - 100.0 fL   MCH 29.1 26.0 - 34.0 pg   MCHC 32.6 30.0 - 36.0 g/dL   RDW 12.4 11.5 - 15.5 %   Platelets 166 150 - 400 K/uL   Neutrophils Relative % 56 %   Neutro Abs 3.6 1.7 - 7.7 K/uL   Lymphocytes Relative 33 %   Lymphs Abs 2.1 0.7 - 4.0 K/uL   Monocytes Relative 7 %   Monocytes Absolute 0.4 0.1 - 1.0 K/uL   Eosinophils Relative 3 %   Eosinophils Absolute 0.2 0.0 - 0.7 K/uL   Basophils Relative 1 %   Basophils Absolute 0.0 0.0 - 0.1 K/uL  Comprehensive metabolic panel     Status: Abnormal   Collection Time: 01/31/17  6:04 AM  Result Value Ref Range   Sodium 135 135 - 145 mmol/L   Potassium 4.2 3.5 - 5.1 mmol/L   Chloride 102 101 - 111 mmol/L   CO2 24 22 - 32 mmol/L   Glucose, Bld 131 (H) 65 - 99 mg/dL   BUN 15 6 - 20 mg/dL   Creatinine, Ser 1.31 (H) 0.44 - 1.00 mg/dL   Calcium 10.1 8.9 - 10.3 mg/dL   Total Protein 6.8 6.5 - 8.1 g/dL   Albumin 3.3 (L) 3.5 - 5.0 g/dL   AST 40 15 - 41 U/L   ALT 31 14 - 54 U/L   Alkaline Phosphatase 68 38 - 126 U/L   Total Bilirubin 0.5 0.3 - 1.2 mg/dL   GFR calc non Af Amer 34 (L) >60 mL/min   GFR calc Af Amer 39 (L) >60 mL/min    Comment: (NOTE) The eGFR has been calculated using the CKD EPI equation. This calculation has not been validated in all clinical situations. eGFR's persistently <60 mL/min  signify possible Chronic Kidney Disease.    Anion gap 9 5 - 15     HEENT: normal Cardio: RRR and no murmur Resp: CTA B/L and unlabored GI: BS positive and NT, ND Extremity:  No Edema Skin:   Other well healed R AKA Neuro: no dysarthria or aphasia, A and O, motor 4-/5 in R delt, bi, tri, grip, 5/5 LUE and LLE, 4/5 R HF, mild fine motor deficits RUE Musc/Skel:  Other R AKA Gen NAD   Assessment/Plan: 1. Functional deficits secondary to Left pontine infarct which require 3+ hours per day of interdisciplinary therapy in a comprehensive inpatient rehab setting. Physiatrist is providing close team supervision and 24 hour management of active medical problems listed below. Physiatrist and rehab team continue to assess barriers to discharge/monitor patient progress toward functional and medical goals. FIM:       Function - Toileting Toileting steps completed by patient: Adjust clothing prior to toileting, Performs perineal hygiene, Adjust clothing  after toileting Toileting Assistive Devices: Grab bar or rail Assist level: Touching or steadying assistance (Pt.75%)  Function - Toilet Transfers Toilet transfer assistive device: Elevated toilet seat/BSC over toilet Assist level to toilet: Touching or steadying assistance (Pt > 75%) Assist level from toilet: Touching or steadying assistance (Pt > 75%)  Function - Chair/bed transfer Chair/bed transfer method: Squat pivot Chair/bed transfer assist level: Touching or steadying assistance (Pt > 75%)  Function - Locomotion: Wheelchair Will patient use wheelchair at discharge?: Yes Type: Manual Max wheelchair distance: 177f  Assist Level: Supervision or verbal cues Assist Level: Supervision or verbal cues Assist Level: Supervision or verbal cues Function - Locomotion: Ambulation Ambulation activity did not occur: Safety/medical concerns Walk 10 feet activity did not occur: Safety/medical concerns Walk 50 feet with 2 turns activity did  not occur: Safety/medical concerns Walk 150 feet activity did not occur: Safety/medical concerns Walk 10 feet on uneven surfaces activity did not occur: Safety/medical concerns  Function - Comprehension Comprehension: (P) Auditory Comprehension assist level: (P) Follows complex conversation/direction with extra time/assistive device  Function - Expression Expression: (P) Verbal Expression assist level: (P) Expresses complex ideas: With extra time/assistive device  Function - Social Interaction Social Interaction assist level: (P) Interacts appropriately with others - No medications needed.  Function - Problem Solving Problem solving assist level: Solves complex problems: With extra time  Function - Memory Memory assist level: Recognizes or recalls 90% of the time/requires cueing < 10% of the time Patient normally able to recall (first 3 days only): Current season, Staff names and faces, That he or she is in a hospital  Medical Problem List and Plan: 1. Right hemiparesissecondary to left pontine infarct. Hx of right AKA -CIR PT, OT  -Not a prosthetic user AKA 2013   2. DVT Prophylaxis/Anticoagulation: Pharmaceutical: Lovenox 3. Chronic HA/back pain/Pain Management: cymbalta--unable to tolerate higher doses.  4. Mood: LCSW to follow for evaluation and support.  5. Neuropsych: This patient is capable of making decisions on herown behalf. 6. Skin/Wound Care: routine pressure relief measures. Maintain adequate nutritional and hydration status.  7. Fluids/Electrolytes/Nutrition: Monitor I/O. Check lytes in am.  8. T2DM: Monitor BS ac/hs. CBG (last 3)   Recent Labs  01/29/17 0740  GLUCAP 122*    9. HTN: Monitor BP bid--goal 130-160 to avoid hypoperfusion due to severe ICA stenosis.No BP meds, poor intake start IVF at noc to boost BP/cerebral perfusion Vitals:   01/31/17 1500 02/01/17 0500  BP: 120/78 119/66  Pulse: 89 90  Resp: 17 16  Temp: 98.1 F  (36.7 C) 98.6 F (37 C)  SpO2: 100% 99%    10. CKD: Monitor intake-- avoid nephrotoxic medications.  11. Peripheral neuropathy/phantom pain: On gabapentin and duloxetine.  -pt does not wish to change dose 12. IBS-Chronic constipation: Change miralax (causing bloating/diarrhea) to Senna S.   LOS (Days) 2 A FACE TO FACE EVALUATION WAS PERFORMED  Ecko Beasley E 02/01/2017, 7:21 AM

## 2017-02-01 NOTE — Progress Notes (Signed)
Occupational Therapy Session Note  Patient Details  Name: Carolyn Werner MRN: [id number] Date of Birth: 1922/03/14  Today's Date: 02/01/2017  Session 1 OT Individual Time: 0903-1000 OT Individual Time Calculation (min): 57 min   Session 2 OT Individual Time: 1157-2620 OT Individual Time Calculation (min): 43 min   Short Term Goals: Week 1:  OT Short Term Goal 1 (Week 1): STG=LTG due to LOS  Skilled Therapeutic Interventions/Progress Updates:  Session 1   OT treatment session focused on modified bathing/dressing, R NMR, and transfers. Pt finishing breakfast upon OT arrival. Discussed PLOF and OT goals for therapy session today. Pt gathered clothing wc level with min A. Pt then completed stand-pivot transfer to tub bench in shower w/ grab bars and min A. Provided pt with long-handled sponge to wash L foot, then worked on leaning method to wash peri-area and bottom. Pt requested to stand to wash more thoroughly and completed with min guard A for balance. Pt tranfserred out of shower in similar fashion as above. B UE coordination with front bra clasping and min guard A for standing balance when pulling pants and underwear over hips.  Pt left seated in wc at end of session with needs met and call bell in reach.   Session 2 OT treatment session focused on standing endurance and R fine motor coordination. B UE coordination with wc propulsion, needed min cues to push fully with R UE. Pt completed 2 sit<>stands in standing frame with facilitation for full L hip and knee extension. While standing, pt able to remove unilateral UEs to reach for therapist's hand. R UE weaker than L requiring decreased distance to reach. Pt tolerated 1 minute at longest standing bout. R UE fine motor coordination with peg board task. Pt able to re-create peg board pattern without difficulty, but needed increased time to place pegs 2/2 R UE weakness. Pt returned to room at end of session and left seated in wc with needs  met.  Therapy Documentation Precautions:  Precautions Precautions: Fall Restrictions Weight Bearing Restrictions: No Pain:  none/denies pain  See Function Navigator for Current Functional Status.   Therapy/Group: Individual Therapy  Valma Cava 02/01/2017, 3:36 PM

## 2017-02-01 NOTE — Progress Notes (Signed)
Physical Therapy Session Note  Patient Details  Name: Jolin Benavides MRN: [id number] Date of Birth: November 18, 1921  Today's Date: 02/01/2017 PT Individual Time: 1100-1200 PT Individual Time Calculation (min): 60 min   Short Term Goals: Week 1:  PT Short Term Goal 1 (Week 1): STG=LTG due to ELOS   Skilled Therapeutic Interventions/Progress Updates:    no c/o pain but reports RUE is feeling heavy and tired today.  Session focus on UE strengthening, balance, and activity tolerance.    Pt propels w/c to and from therapy gym with BUEs, supervision, for mobility and activity tolerance.  Pt requires more than a reasonable amount of time and occasional short rest breaks during mobility.  Squat/pivot w/c<>therapy mat with close supervision.    Sitting balance during zoom ball and ball toss activities focus on anticipatory postural reactions, core strengthening, UE strengthening and mobility, and overall activity tolerance.    Pt completes w/c obstacle course with supervision and min/mod verbal cues to avoid obstacles on R and L side of w/c.    Returned to room at end of session and set up with lunch tray.  Call bell in reach and needs met.   Therapy Documentation Precautions:  Precautions Precautions: Fall Restrictions Weight Bearing Restrictions: No   See Function Navigator for Current Functional Status.   Therapy/Group: Individual Therapy  Michel Santee 02/01/2017, 11:58 AM

## 2017-02-01 NOTE — Evaluation (Signed)
Recreational Therapy Assessment and Plan  Patient Details  Name: Carolyn Werner MRN: [id number] Date of Birth: [birth year] Today's Date: 02/01/2017  Rehab Potential: Good ELOS: 10 days   Assessment Problem List:      Patient Active Problem List   Diagnosis Date Noted  . Embolic stroke ([place]) 32/44/0102  . Ataxia, post-stroke   . Diabetes mellitus type 2 in nonobese ([name])   . Stage 3 chronic kidney disease   . History of right above knee amputation ([place])   . History of CVA (cerebrovascular accident)   . Benign essential HTN   . Transient cerebral ischemia   . Cerebral thrombosis with cerebral infarction 01/28/2017  . Right arm weakness 01/26/2017  . Weakness 01/26/2017  . Hyperlipemia 10/05/2016  . Weakness of both arms   . Left pontine stroke ([place]) 07/28/2016  . Cerebral infarction due to occlusion of left vertebral artery ([place])   . Slurred speech 07/27/2016  . CKD (chronic kidney disease) stage 3, GFR 30-59 ml/min 07/27/2016  . Diabetes mellitus ([place]) 07/27/2016  . Hypertension 07/27/2016  . Depression 07/27/2016  . Glaucoma 07/27/2016  . Neuropathic pain 07/27/2016  . Asthma 07/27/2016  . Heart palpitations 07/05/2016  . Cardiac murmur 07/05/2016  . Phantom limb pain ([place]) 11/13/2014  . Cervical disc disorder with radiculopathy of cervical region 11/13/2014    Past Medical History:      Past Medical History:  Diagnosis Date  . DDD (degenerative disc disease), cervical   . Diabetes mellitus without complication ([place])   . Headache   . Hx of benign neoplasm of pituitary gland   . IBS (irritable colon syndrome)-C   . Mild persistent asthma   . PVD (peripheral vascular disease) ([place])   . Shingles   . Stroke [facility])    Past Surgical History:       Past Surgical History:  Procedure Laterality Date  . ABOVE KNEE LEG AMPUTATION Right 2013  . APPENDECTOMY    . BYPASS GRAFT     ileofemoral   . PITUITARY EXCISION    . TONSILLECTOMY       Assessment & Plan Clinical Impression: Carolyn Werner a 83 y.o.RH-femalewith history of T2DM with peripheral neuropathy, CKD-baseline SCr-1.3, R-AKA- wheelchair bound, asthma, chronic back pain/HA, bilateral cerebellar/pontine strokes 07/2016 due to high grade L-ICA stenosis; who was admitted on 01/26/17 with increasing right sided weakness and slurred speech when getting up from sleep. 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 Patient with improving symptoms post admission. CTA Head/neck recommended but family declined. 2D echo with EF 60-65% with grade one diastolic dysfunction, calcified mitral annulus and lipomatous atrial septal hypertrophy. Dr. Leonie Man recommended ASA and Brilinta for stroke prophylaxis and BP goal 130-160 due to severe vascular stenosis.  Patient transferred to CIR on 01/30/2017 .    Pt presents with decreased activity tolerance, decreased functional mobility, decreased balance, decreased coordination Limiting pt's independence with leisure/community pursuits.   Leisure History/Participation Premorbid leisure interest/current participation: Crafts - Knitting/Crocheting;Sports - Exercise (Comment);Games - Word-search Other Leisure Interests: Reading Leisure Participation Style: With Family/Friends Awareness of Community Resources: Good-identify 3 post discharge leisure resources Psychosocial / Spiritual Patient agreeable to Pet Therapy: Yes Does patient have pets?: No Social interaction - Mood/Behavior: Cooperative Engineer, drilling for Education?: Yes Recreational Therapy Orientation Orientation -Reviewed with patient: Available activity resources Strengths/Weaknesses Patient Strengths/Abilities: Willingness to participate;Active premorbidly Patient weaknesses: Physical  limitations TR Patient demonstrates impairments in the following area(s): Endurance;Motor;Safety  Plan Rec Therapy Plan Is patient appropriate for Therapeutic Recreation?: Yes Rehab Potential: Good Treatment times per week: Min 1 TR session >20 minutes duirng LOS Estimated Length of Stay: 10 days TR Treatment/Interventions: Adaptive equipment instruction;1:1 session;Balance/vestibular training;Functional mobility training;Community reintegration;Group participation (Comment);Patient/family education;Therapeutic activities;Recreation/leisure participation;Therapeutic exercise;UE/LE Coordination activities  Recommendations for other services: None   Discharge Criteria: Patient will be discharged from TR if patient refuses treatment 3 consecutive times without medical reason.  If treatment goals not met, if there is a change in medical status, if patient makes no progress towards goals or if patient is discharged from hospital.  The above assessment, treatment plan, treatment alternatives and goals were discussed and mutually agreed upon: by patient  [place] 02/01/2017, 10:44 AM

## 2017-02-02 ENCOUNTER — Inpatient Hospital Stay (HOSPITAL_COMMUNITY): Payer: Medicare Other | Admitting: Physical Therapy

## 2017-02-02 ENCOUNTER — Inpatient Hospital Stay (HOSPITAL_COMMUNITY): Payer: Medicare Other

## 2017-02-02 DIAGNOSIS — G546 Phantom limb syndrome with pain: Secondary | ICD-10-CM

## 2017-02-02 DIAGNOSIS — N183 Chronic kidney disease, stage 3 (moderate): Secondary | ICD-10-CM

## 2017-02-02 DIAGNOSIS — K589 Irritable bowel syndrome without diarrhea: Secondary | ICD-10-CM

## 2017-02-02 DIAGNOSIS — E1142 Type 2 diabetes mellitus with diabetic polyneuropathy: Secondary | ICD-10-CM

## 2017-02-02 DIAGNOSIS — I10 Essential (primary) hypertension: Secondary | ICD-10-CM

## 2017-02-02 LAB — GLUCOSE, CAPILLARY
GLUCOSE-CAPILLARY: 157 mg/dL — AB (ref 65–99)
GLUCOSE-CAPILLARY: 135 mg/dL — AB (ref 65–99)
Glucose-Capillary: 185 mg/dL — ABNORMAL HIGH (ref 65–99)

## 2017-02-02 NOTE — Progress Notes (Signed)
Physical Therapy Session Note  Patient Details  Name: Carolyn Werner MRN: [id number] Date of Birth: 06/09/1921  Today's Date: 02/02/2017 PT Individual Time: 1400-1424 PT Individual Time Calculation (min): 24 min   Short Term Goals: Week 1:  PT Short Term Goal 1 (Week 1): STG=LTG due to ELOS   Skilled Therapeutic Interventions/Progress Updates:   Pt in w/c upon arrival and agreeable to therapy, no c/o pain. Worked on activity tolerance and functional mobility this session. Pt performed sit<>stand x2 and static standing for 30 sec w/o UE support and Min guard. Transferred to EOB Min guard and performed side sit-ups bilaterally, 2x5 to work on trunk control for bed mobility. Ambulated 5' (~12-15 steps/hops) w/ RW and Min guard. Verbal cues for RW management and technique. Ended session in w/c in care of friend, call bell within reach and all needs met.   Therapy Documentation Precautions:  Precautions Precautions: Fall Restrictions Weight Bearing Restrictions: No Vital Signs: Therapy Vitals Temp: 97.7 F (36.5 C) Temp Source: Oral Pulse Rate: 87 Resp: 17 BP: 138/62 Patient Position (if appropriate): Sitting Oxygen Therapy SpO2: 98 % O2 Device: Not Delivered  See Function Navigator for Current Functional Status.   Therapy/Group: Individual Therapy  Amy K Arnette 02/02/2017, 2:25 PM  

## 2017-02-02 NOTE — Progress Notes (Signed)
Physical Therapy Session Note  Patient Details  Name: Carolyn Werner MRN: [id number] Date of Birth: 1922/05/25  Today's Date: 02/02/2017 PT Individual Time: 0815-0900 PT Individual Time Calculation (min): 45 min   Short Term Goals: Week 1:  PT Short Term Goal 1 (Week 1): STG=LTG due to ELOS   Skilled Therapeutic Interventions/Progress Updates:   Pt received sitting on toilet and agreeable to PT.   Pt performed perineal hygiene without assist from PT. Min assist for clothing management for proper orientation of underpants. Pt performed upper and lower body dressing with min assist.   WC mobility training in hall x 163f and 101f Supervision assist from PT with min cues for doorway management and decreased turning radius in crowded gym.   Gait trainnig with RW x [id number] min assist. Moderate cues for improved use of BUE and allow LE to swing forward and decreased attempt to Hop   Nustep NMR, strengthening and endurance training x 6 mintues.cues from PT for increased ROM on the RUE.   Patient returned too room and left sitting in WC [facility] call bell in reach and all needs met.          Therapy Documentation Precautions:  Precautions Precautions: Fall Restrictions Weight Bearing Restrictions: No General: PT Amount of Missed Time (min): 15 Minutes PT Missed Treatment Reason: Other (Comment) (pt eating) Vital Signs: Oxygen Therapy O2 Device: Not Delivered   See Function Navigator for Current Functional Status.   Therapy/Group: Individual Therapy  AusLorie Phenix31/2018, 10:08 AM

## 2017-02-02 NOTE — Progress Notes (Signed)
Subjective/Complaints: Pt seen laying in bed this AM.  She slept well overnight.    ROS: Denies CP, SOB, N/V/D  Objective: Vital Signs: Blood pressure (!) 142/69, pulse 87, temperature 98.3 F (36.8 C), temperature source Oral, resp. rate 16, height '5\' 4"'  (1.626 m), weight 58.4 kg (128 lb 11.2 oz), SpO2 100 %. No results found. Results for orders placed or performed during the hospital encounter of 01/30/17 (from the past 72 hour(s))  CBC WITH DIFFERENTIAL     Status: None   Collection Time: 01/31/17  6:04 AM  Result Value Ref Range   WBC 6.3 4.0 - 10.5 K/uL   RBC 4.50 3.87 - 5.11 MIL/uL   Hemoglobin 13.1 12.0 - 15.0 g/dL   HCT 40.2 36.0 - 46.0 %   MCV 89.3 78.0 - 100.0 fL   MCH 29.1 26.0 - 34.0 pg   MCHC 32.6 30.0 - 36.0 g/dL   RDW 12.4 11.5 - 15.5 %   Platelets 166 150 - 400 K/uL   Neutrophils Relative % 56 %   Neutro Abs 3.6 1.7 - 7.7 K/uL   Lymphocytes Relative 33 %   Lymphs Abs 2.1 0.7 - 4.0 K/uL   Monocytes Relative 7 %   Monocytes Absolute 0.4 0.1 - 1.0 K/uL   Eosinophils Relative 3 %   Eosinophils Absolute 0.2 0.0 - 0.7 K/uL   Basophils Relative 1 %   Basophils Absolute 0.0 0.0 - 0.1 K/uL  Comprehensive metabolic panel     Status: Abnormal   Collection Time: 01/31/17  6:04 AM  Result Value Ref Range   Sodium 135 135 - 145 mmol/L   Potassium 4.2 3.5 - 5.1 mmol/L   Chloride 102 101 - 111 mmol/L   CO2 24 22 - 32 mmol/L   Glucose, Bld 131 (H) 65 - 99 mg/dL   BUN 15 6 - 20 mg/dL   Creatinine, Ser 1.31 (H) 0.44 - 1.00 mg/dL   Calcium 10.1 8.9 - 10.3 mg/dL   Total Protein 6.8 6.5 - 8.1 g/dL   Albumin 3.3 (L) 3.5 - 5.0 g/dL   AST 40 15 - 41 U/L   ALT 31 14 - 54 U/L   Alkaline Phosphatase 68 38 - 126 U/L   Total Bilirubin 0.5 0.3 - 1.2 mg/dL   GFR calc non Af Amer 34 (L) >60 mL/min   GFR calc Af Amer 39 (L) >60 mL/min    Comment: (NOTE) The eGFR has been calculated using the CKD EPI equation. This calculation has not been validated in all clinical  situations. eGFR's persistently <60 mL/min signify possible Chronic Kidney Disease.    Anion gap 9 5 - 15     HEENT: Normocephalic, atraumatic Cardio: RRR and no JVD Resp: CTA B/L and unlabored GI: BS positive and ND Skin:   Other well healed R AKA Neuro: Alert and oriented  Motor 4/5 in R delt, bi, tri, grip, 5/5 LUE and LLE, 4+/5 R HF Mild fine motor deficits RUE HOH Musc/Skel:  R AKA. No edema, no tenderness Gen NAD. Vital signs reviewed   Assessment/Plan: 1. Functional deficits secondary to Left pontine infarct which require 3+ hours per day of interdisciplinary therapy in a comprehensive inpatient rehab setting. Physiatrist is providing close team supervision and 24 hour management of active medical problems listed below. Physiatrist and rehab team continue to assess barriers to discharge/monitor patient progress toward functional and medical goals. FIM: Function - Bathing Position: Shower Body parts bathed by patient: Right arm, Left arm, Chest,  Abdomen, Front perineal area, Right upper leg, Left upper leg Body parts bathed by helper: Left lower leg, Buttocks Bathing not applicable: Right lower leg Assist Level: Touching or steadying assistance(Pt > 75%)  Function- Upper Body Dressing/Undressing What is the patient wearing?: Pull over shirt/dress, Bra Bra - Perfomed by patient: Thread/unthread right bra strap, Thread/unthread left bra strap Bra - Perfomed by helper: Hook/unhook bra (pull down sports bra) Pull over shirt/dress - Perfomed by patient: Thread/unthread right sleeve, Thread/unthread left sleeve, Put head through opening, Pull shirt over trunk Assist Level: Touching or steadying assistance(Pt > 75%) Function - Lower Body Dressing/Undressing What is the patient wearing?: Underwear, Pants, Non-skid slipper socks Position: Wheelchair/chair at sink Underwear - Performed by patient: Thread/unthread right underwear leg, Thread/unthread left underwear leg Underwear  - Performed by helper: Pull underwear up/down Pants- Performed by patient: Thread/unthread right pants leg, Thread/unthread left pants leg Pants- Performed by helper: Pull pants up/down Non-skid slipper socks- Performed by patient: Don/doff left sock Assist for footwear: Partial/moderate assist Assist for lower body dressing: Touching or steadying assistance (Pt > 75%)  Function - Toileting Toileting steps completed by patient: Adjust clothing prior to toileting, Performs perineal hygiene, Adjust clothing after toileting Toileting Assistive Devices: Grab bar or rail Assist level: Touching or steadying assistance (Pt.75%)  Function - Air cabin crew transfer assistive device: Elevated toilet seat/BSC over toilet Assist level to toilet: Touching or steadying assistance (Pt > 75%) Assist level from toilet: Touching or steadying assistance (Pt > 75%)  Function - Chair/bed transfer Chair/bed transfer method: Stand pivot Chair/bed transfer assist level: Supervision or verbal cues Chair/bed transfer assistive device: Armrests, Walker  Function - Locomotion: Wheelchair Will patient use wheelchair at discharge?: Yes Type: Manual Max wheelchair distance: 135f  Assist Level: Supervision or verbal cues Assist Level: Supervision or verbal cues Assist Level: Supervision or verbal cues Function - Locomotion: Ambulation Ambulation activity did not occur: Safety/medical concerns Assistive device: Walker-rolling Max distance: 3 Assist level: Touching or steadying assistance (Pt > 75%) Walk 10 feet activity did not occur: Safety/medical concerns Walk 50 feet with 2 turns activity did not occur: Safety/medical concerns Walk 150 feet activity did not occur: Safety/medical concerns Walk 10 feet on uneven surfaces activity did not occur: Safety/medical concerns  Function - Comprehension Comprehension: Auditory Comprehension assist level: Follows complex conversation/direction with extra  time/assistive device  Function - Expression Expression: Verbal Expression assist level: Expresses complex ideas: With extra time/assistive device  Function - Social Interaction Social Interaction assist level: Interacts appropriately with others - No medications needed.  Function - Problem Solving Problem solving assist level: Solves complex problems: With extra time  Function - Memory Memory assist level: Recognizes or recalls 90% of the time/requires cueing < 10% of the time Patient normally able to recall (first 3 days only): Current season, Staff names and faces, That he or she is in a hospital  Medical Problem List and Plan: 1. Right hemiparesissecondary to left pontine infarct. Hx of right AKA -Cont CIR  -Not a prosthetic user AKA 2013     Notes reviewed 2. DVT Prophylaxis/Anticoagulation: Pharmaceutical: Lovenox 3. Chronic HA/back pain/Pain Management: cymbalta--unable to tolerate higher doses.  4. Mood: LCSW to follow for evaluation and support.  5. Neuropsych: This patient is capable of making decisions on herown behalf. 6. Skin/Wound Care: routine pressure relief measures. Maintain adequate nutritional and hydration status.  7. Fluids/Electrolytes/Nutrition: Monitor I/Os 8. T2DM: Monitor BS ac/hs.  CBGs ordered CBG (last 3)  No results for input(s): GLUCAP in  the last 72 hours. 9. HTN: Monitor BP bid--goal 130-160 to avoid hypoperfusion due to severe ICA stenosis.  No BP meds, started IVF qhs Vitals:   02/01/17 1500 02/02/17 0532  BP: 121/71 (!) 142/69  Pulse: 88 87  Resp: 17 16  Temp: 98.4 F (36.9 C) 98.3 F (36.8 C)  SpO2: 100% 100%   10. CKD: Monitor intake-- avoid nephrotoxic medications.   Cr. 1.31 on 8/29  Cont to monitor 11. Peripheral neuropathy/phantom pain: On gabapentin and duloxetine.  -pt does not wish to change dose 12. IBS-Chronic constipation: Change miralax (causing bloating/diarrhea) to Senna S.    LOS (Days) 3 A FACE TO FACE EVALUATION WAS PERFORMED  Tiasha Helvie Lorie Phenix 02/02/2017, 8:36 AM

## 2017-02-02 NOTE — Progress Notes (Signed)
Physical Therapy Session Note  Patient Details  Name: Carolyn Werner MRN: [id number] Date of Birth: Nov 27, 1921  Today's Date: 02/02/2017 PT Individual Time: 1530-1630 PT Individual Time Calculation (min): 60 min   Short Term Goals: Week 1:  PT Short Term Goal 1 (Week 1): STG=LTG due to ELOS   Skilled Therapeutic Interventions/Progress Updates:    no c/o pain, session focus on UE strengthening and NMR.    Pt propels w/c to and from therapy gym mod I.  Transfers throughout session squat/pivot with supervision.  Attempted UE lifts in // bars to clear LLE and re-educate swing through gait pattern, but pt with increasing pain with R shoulder pain.  Seated tricep push ups 2x7 reps with UEs only and pt able to clear bottom 70% of the time.  RUE fine motor coordination with theraputty tasks and stringing beads onto string with increased time.  Pt returned to room at end of session and positioned upright in w/c with call bell in reach and needs met.   Therapy Documentation Precautions:  Precautions Precautions: Fall Restrictions Weight Bearing Restrictions: No   See Function Navigator for Current Functional Status.   Therapy/Group: Individual Therapy  Michel Santee 02/02/2017, 4:33 PM

## 2017-02-02 NOTE — Progress Notes (Signed)
Physical Therapy Session Note  Patient Details  Name: Carolyn Werner MRN: [id number] Date of Birth: 10/02/1921  Today's Date: 02/01/2017 PT Individual Time: 1530-1600 30min      Short Term Goals: Week 1:  PT Short Term Goal 1 (Week 1): STG=LTG due to ELOS   Skilled Therapeutic Interventions/Progress Updates:      Pt received sitting in WC and agreeable to PT  WC mobility training instructed by PT x 150ft with supervision assist and min cues for doorway management.   PT instructed pt in Pre-gait activities sit<>stand and parallel bars and at RW with min assist. Standing tolerance 3 x 30 secodns at each AD. No reports of pain of fatigue. PT instructed pt and gait training with RW x 3 ft with swing to gait pattern. Max cues for positioning of RW and use of BUE to lift foot off that ground to allow trunk to swing to middle of RW. Min assist from PT throughout gait. Pt   Patient returned to room and left sitting in WC with call bell in reach and all needs met.    Therapy Documentation Precautions:  Precautions Precautions: Fall Restrictions Weight Bearing Restrictions: No Vital Signs: Therapy Vitals Temp: 98.3 F (36.8 C) Temp Source: Oral Pulse Rate: 87 Resp: 16 BP: (!) 142/69 Patient Position (if appropriate): Lying Oxygen Therapy SpO2: 100 % O2 Device: Not Delivered   See Function Navigator for Current Functional Status.   Therapy/Group: Individual Therapy  Austin E Tucker 02/02/2017, 7:36 AM  

## 2017-02-02 NOTE — Progress Notes (Signed)
Occupational Therapy Session Note  Patient Details  Name: Kriti Katayama MRN: [id number] Date of Birth: 11-08-1921  Today's Date: 02/02/2017 OT Individual Time: 4360-1658 OT Individual Time Calculation (min): 57 min    Short Term Goals: Week 1:  OT Short Term Goal 1 (Week 1): STG=LTG due to LOS  Skilled Therapeutic Interventions/Progress Updates:    1:1. Pt bathes at sit to stand level at sink with supervision level and unilateral suport with RUE for balance. No c/o pain. OT educated pt that she can use RUE to complete taasks such as clothing management and hygiene as long as she balances with LUE. Pt reports she learned to only stabilize with dominant R hand from previous therapy. Pt doffs/dons pants/shirt with supervision. Pt propels part way in w/c to day room with supervision for general conditioning. OT instructs in reacher use/sock aide to doff/ don sock to don sock and pt return demonstrates with supervision. Exited session with pt seated in w/c with call light in reach and all needs met.   Therapy Documentation Precautions:  Precautions Precautions: Fall Restrictions Weight Bearing Restrictions: No Other Treatments:    See Function Navigator for Current Functional Status.   Therapy/Group: Individual Therapy  Tonny Branch 02/02/2017, 11:15 AM

## 2017-02-03 ENCOUNTER — Inpatient Hospital Stay (HOSPITAL_COMMUNITY): Payer: Medicare Other

## 2017-02-03 ENCOUNTER — Inpatient Hospital Stay (HOSPITAL_COMMUNITY): Payer: Medicare Other | Admitting: Physical Therapy

## 2017-02-03 DIAGNOSIS — Z89611 Acquired absence of right leg above knee: Secondary | ICD-10-CM

## 2017-02-03 LAB — GLUCOSE, CAPILLARY
Glucose-Capillary: 134 mg/dL — ABNORMAL HIGH (ref 65–99)
Glucose-Capillary: 134 mg/dL — ABNORMAL HIGH (ref 65–99)
Glucose-Capillary: 127 mg/dL — ABNORMAL HIGH (ref 65–99)
Glucose-Capillary: 158 mg/dL — ABNORMAL HIGH (ref 65–99)

## 2017-02-03 NOTE — Progress Notes (Signed)
Subjective/Complaints: Patient seen sitting up in her chair this morning. She states she slept well overnight. She denies complaints this morning.  ROS: Denies CP, SOB, N/V/D  Objective: Vital Signs: Blood pressure 129/81, pulse 92, temperature 97.7 F (36.5 C), temperature source Oral, resp. rate 18, height 5\' 4"  (1.626 m), weight 58.4 kg (128 lb 11.2 oz), SpO2 98 %. No results found. Results for orders placed or performed during the hospital encounter of 01/30/17 (from the past 72 hour(s))  Glucose, capillary     Status: Abnormal   Collection Time: 02/02/17 11:38 AM  Result Value Ref Range   Glucose-Capillary 157 (H) 65 - 99 mg/dL  Glucose, capillary     Status: Abnormal   Collection Time: 02/02/17  4:46 PM  Result Value Ref Range   Glucose-Capillary 185 (H) 65 - 99 mg/dL  Glucose, capillary     Status: Abnormal   Collection Time: 02/02/17  9:27 PM  Result Value Ref Range   Glucose-Capillary 135 (H) 65 - 99 mg/dL  Glucose, capillary     Status: Abnormal   Collection Time: 02/03/17  6:21 AM  Result Value Ref Range   Glucose-Capillary 134 (H) 65 - 99 mg/dL   Comment 1 Notify RN      HEENT: Normocephalic, atraumatic Cardio: RRR and no JVD Resp: CTA B/L and Unlabored GI: BS positive and ND Skin:   Healed R AKA.  Neuro: Alert and oriented  Motor 4/5 in R delt, bi, tri, grip, 5/5 LUE and LLE, 4+/5 R HF (stable) Mild fine motor deficits RUE HOH Musc/Skel:  R AKA. No edema, no tenderness Gen NAD. Vital signs reviewed   Assessment/Plan: 1. Functional deficits secondary to Left pontine infarct which require 3+ hours per day of interdisciplinary therapy in a comprehensive inpatient rehab setting. Physiatrist is providing close team supervision and 24 hour management of active medical problems listed below. Physiatrist and rehab team continue to assess barriers to discharge/monitor patient progress toward functional and medical goals. FIM: Function - Bathing Position:  Shower Body parts bathed by patient: Right arm, Left arm, Chest, Abdomen, Front perineal area, Right upper leg, Left upper leg Body parts bathed by helper: Left lower leg, Buttocks Bathing not applicable: Right lower leg Assist Level: Touching or steadying assistance(Pt > 75%)  Function- Upper Body Dressing/Undressing What is the patient wearing?: Pull over shirt/dress, Bra Bra - Perfomed by patient: Thread/unthread right bra strap, Thread/unthread left bra strap Bra - Perfomed by helper: Hook/unhook bra (pull down sports bra) Pull over shirt/dress - Perfomed by patient: Thread/unthread right sleeve, Thread/unthread left sleeve, Put head through opening, Pull shirt over trunk Assist Level: Touching or steadying assistance(Pt > 75%) Function - Lower Body Dressing/Undressing What is the patient wearing?: Underwear, Pants, Non-skid slipper socks Position: Wheelchair/chair at sink Underwear - Performed by patient: Thread/unthread right underwear leg, Thread/unthread left underwear leg Underwear - Performed by helper: Pull underwear up/down Pants- Performed by patient: Thread/unthread right pants leg, Thread/unthread left pants leg Pants- Performed by helper: Pull pants up/down Non-skid slipper socks- Performed by patient: Don/doff left sock Assist for footwear: Partial/moderate assist Assist for lower body dressing: Touching or steadying assistance (Pt > 75%)  Function - Toileting Toileting steps completed by patient: Adjust clothing prior to toileting, Performs perineal hygiene, Adjust clothing after toileting Toileting steps completed by helper: Adjust clothing prior to toileting, Performs perineal hygiene, Adjust clothing after toileting (per [employer], NT report) Toileting Assistive Devices: Grab bar or rail Assist level: Supervision or verbal cues  Function -  ArchivistToilet Transfers Toilet transfer assistive device: Elevated toilet seat/BSC over toilet, Grab bar Assist level to toilet:  Supervision or verbal cues Assist level from toilet: Supervision or verbal cues  Function - Chair/bed transfer Chair/bed transfer method: Squat pivot Chair/bed transfer assist level: Supervision or verbal cues Chair/bed transfer assistive device: Armrests, Walker  Function - Locomotion: Wheelchair Will patient use wheelchair at discharge?: Yes Type: Manual Max wheelchair distance: 15650ft  Assist Level: Supervision or verbal cues Assist Level: Supervision or verbal cues Assist Level: Supervision or verbal cues Function - Locomotion: Ambulation Ambulation activity did not occur: Safety/medical concerns Assistive device: Walker-rolling Max distance: 5' Assist level: Touching or steadying assistance (Pt > 75%) Walk 10 feet activity did not occur: Safety/medical concerns Walk 50 feet with 2 turns activity did not occur: Safety/medical concerns Walk 150 feet activity did not occur: Safety/medical concerns Walk 10 feet on uneven surfaces activity did not occur: Safety/medical concerns  Function - Comprehension Comprehension: Auditory Comprehension assist level: Follows complex conversation/direction with extra time/assistive device  Function - Expression Expression: Verbal Expression assist level: Expresses complex ideas: With extra time/assistive device  Function - Social Interaction Social Interaction assist level: Interacts appropriately with others - No medications needed.  Function - Problem Solving Problem solving assist level: Solves complex problems: With extra time  Function - Memory Memory assist level: Recognizes or recalls 90% of the time/requires cueing < 10% of the time Patient normally able to recall (first 3 days only): Current season, Location of own room, Staff names and faces, That he or she is in a hospital  Medical Problem List and Plan: 1. Right hemiparesissecondary to left pontine infarct. Hx of right AKA -Cont CIR  -Not a  prosthetic user AKA 2013    2. DVT Prophylaxis/Anticoagulation: Pharmaceutical: Lovenox 3. Chronic HA/back pain/Pain Management: cymbalta--unable to tolerate higher doses.  4. Mood: LCSW to follow for evaluation and support.  5. Neuropsych: This patient is capable of making decisions on herown behalf. 6. Skin/Wound Care: routine pressure relief measures. Maintain adequate nutritional and hydration status.  7. Fluids/Electrolytes/Nutrition: Monitor I/Os 8. T2DM: Monitor BS ac/hs.  CBGs ordered CBG (last 3)   Recent Labs  02/02/17 1646 02/02/17 2127 02/03/17 0621  GLUCAP 185* 135* 134*   Relatively controlled on 9/1 9. HTN: Monitor BP bid--goal 130-160 to avoid hypoperfusion due to severe ICA stenosis.  No BP meds, started IVF qhs Vitals:   02/03/17 0540 02/03/17 0753  BP: 129/81   Pulse: 92   Resp: 18   Temp: 97.7 F (36.5 C)   SpO2: 100% 98%   Relatively controlled on 9/1 10. CKD: Monitor intake-- avoid nephrotoxic medications.   Cr. 1.31 on 8/29  Cont to monitor 11. Peripheral neuropathy/phantom pain: On gabapentin and duloxetine.  -pt does not wish to change dose 12. IBS-Chronic constipation: Change miralax (causing bloating/diarrhea) to Senna S.   LOS (Days) 4 A FACE TO FACE EVALUATION WAS PERFORMED  Sophy Mesler Karis Jubanil Jeriah Skufca 02/03/2017, 8:38 AM

## 2017-02-03 NOTE — Progress Notes (Signed)
Occupational Therapy Session Note  Patient Details  Name: Carolyn Werner MRN: [id number] Date of Birth: 1921/08/12  Today's Date: 02/03/2017 OT Individual Time: 9795-3692 OT Individual Time Calculation (min): 42 min    Short Term Goals: Week 1:  OT Short Term Goal 1 (Week 1): STG=LTG due to LOS  Skilled Therapeutic Interventions/Progress Updates:    1:1. Focus of session on [facility] and handwriting. Pt manipulates scrabble tiles using finger<>palm manipulation and flipping tiles with RUE with min VC to decrease compensatory movements. Pt spells words with tiles using RUE, and uses pen with red foam handle to write created words. With large crochet needle pt completes 15 chain stitches to improve [facility] and participate in leisure activity. Exited session with pt seated in w/c with call light in reach and all needs met.   Therapy Documentation Precautions:  Precautions Precautions: Fall Restrictions Weight Bearing Restrictions: No  See Function Navigator for Current Functional Status.   Therapy/Group: Individual Therapy  Tonny Branch 02/03/2017, 5:05 PM

## 2017-02-03 NOTE — Progress Notes (Signed)
Occupational Therapy Session Note  Patient Details  Name: Charlesetta IvoryJuanita Prows MRN: [id number] Date of Birth: 09/04/1921  Today's Date: 02/03/2017 OT Individual Time: 1100-1157 OT Individual Time Calculation (min): 57 min    Short Term Goals: Week 1:  OT Short Term Goal 1 (Week 1): STG=LTG due to LOS  Skilled Therapeutic Interventions/Progress Updates:    1:1. Pt propels w/c around room/to/from all tx destinations with supervision. Pt bathes at sit to stand level with supervision and VC for posture while standing to was buttocks. Pt dons UB clothing with set up and LB clothing with supervision and VC for sock aide use. [facility] activities focusing on palm<>finger translocation, sorting, stacking coins. Exited session with pt seated in w/c with NT checking blood sugar.  Therapy Documentation Precautions:  Precautions Precautions: Fall Restrictions Weight Bearing Restrictions: No  See Function Navigator for Current Functional Status.   Therapy/Group: Individual Therapy  Shon HaleStephanie M Hollin Crewe 02/03/2017, 12:15 PM

## 2017-02-03 NOTE — Progress Notes (Signed)
Physical Therapy Session Note  Patient Details  Name: Carolyn Werner MRN: [id number] Date of Birth: Dec 20, 1921  Today's Date: 02/03/2017 PT Individual Time: 1415-1445 PT Individual Time Calculation (min): 30 min   Short Term Goals: Week 1:  PT Short Term Goal 1 (Week 1): STG=LTG due to ELOS   Skilled Therapeutic Interventions/Progress Updates:    no c/o pain.  Session focus on standing tolerance.  Pt completes sit<>stand x5 trials from w/c level with initial min guard for forward weight shift cue fade to supervision.  In standing pt completes puzzle task focus on alternating UE support and standing tolerance.  Pt returned to room at end of session and positioned in w/c with call bell in reach and needs met.   Therapy Documentation Precautions:  Precautions Precautions: Fall Restrictions Weight Bearing Restrictions: No   See Function Navigator for Current Functional Status.   Therapy/Group: Individual Therapy  Michel Santee 02/03/2017, 2:46 PM

## 2017-02-03 NOTE — Progress Notes (Signed)
Physical Therapy Session Note  Patient Details  Name: Enola Siebers MRN: [id number] Date of Birth: 1922-05-25  Today's Date: 02/03/2017 PT Individual Time: 0830-0925 PT Individual Time Calculation (min): 55 min   Short Term Goals: Week 1:  PT Short Term Goal 1 (Week 1): STG=LTG due to ELOS   Skilled Therapeutic Interventions/Progress Updates:    no c/o pain. Session focus on UE strengthening and NMR and activity tolerance.    Pt propels w/c throughout unit, max distance 200', with BUEs and mod I.  Pt reports feeling like her RUE is not working as hard for w/c propulsion but she maintains a straight path without assistance.  Squat/pivot downhill to R with supervision and squat/pivot uphill to L with min assist.    PT instructed pt in BUE therex with 2# dowel for chest press, shoulder press, and bicep curls 2x10-15 reps each.  Pt requires cues for recalling number of repetitions, posture, and correct form.  Pt completes 3 trials of dynavision on mode A (3 rings, followed by 2 trials of lower half only for w/c level), focus on RUE strengthening, gross and fine motor coordination, and activity tolerance.    Pt returned to room at end of session and positioned upright in w/c with call bell in reach and needs met.   Therapy Documentation Precautions:  Precautions Precautions: Fall Restrictions Weight Bearing Restrictions: No   See Function Navigator for Current Functional Status.   Therapy/Group: Individual Therapy  Michel Santee 02/03/2017, 9:27 AM

## 2017-02-04 ENCOUNTER — Inpatient Hospital Stay (HOSPITAL_COMMUNITY): Payer: Medicare Other

## 2017-02-04 LAB — GLUCOSE, CAPILLARY
GLUCOSE-CAPILLARY: 146 mg/dL — AB (ref 65–99)
Glucose-Capillary: 126 mg/dL — ABNORMAL HIGH (ref 65–99)
Glucose-Capillary: 243 mg/dL — ABNORMAL HIGH (ref 65–99)
Glucose-Capillary: 112 mg/dL — ABNORMAL HIGH (ref 65–99)

## 2017-02-04 NOTE — Progress Notes (Signed)
Subjective/Complaints: Patient seen sitting up in her wheelchair this morning. She slept well overnight. She feels she is improving.  ROS: Denies CP, SOB, N/V/D  Objective: Vital Signs: Blood pressure 127/80, pulse 87, temperature 97.9 F (36.6 C), temperature source Oral, resp. rate 18, height 5\' 4"  (1.626 m), weight 58.4 kg (128 lb 11.2 oz), SpO2 100 %. No results found. Results for orders placed or performed during the hospital encounter of 01/30/17 (from the past 72 hour(s))  Glucose, capillary     Status: Abnormal   Collection Time: 02/02/17 11:38 AM  Result Value Ref Range   Glucose-Capillary 157 (H) 65 - 99 mg/dL  Glucose, capillary     Status: Abnormal   Collection Time: 02/02/17  4:46 PM  Result Value Ref Range   Glucose-Capillary 185 (H) 65 - 99 mg/dL  Glucose, capillary     Status: Abnormal   Collection Time: 02/02/17  9:27 PM  Result Value Ref Range   Glucose-Capillary 135 (H) 65 - 99 mg/dL  Glucose, capillary     Status: Abnormal   Collection Time: 02/03/17  6:21 AM  Result Value Ref Range   Glucose-Capillary 134 (H) 65 - 99 mg/dL   Comment 1 Notify RN   Glucose, capillary     Status: Abnormal   Collection Time: 02/03/17 12:01 PM  Result Value Ref Range   Glucose-Capillary 134 (H) 65 - 99 mg/dL  Glucose, capillary     Status: Abnormal   Collection Time: 02/03/17  4:21 PM  Result Value Ref Range   Glucose-Capillary 127 (H) 65 - 99 mg/dL  Glucose, capillary     Status: Abnormal   Collection Time: 02/03/17  9:01 PM  Result Value Ref Range   Glucose-Capillary 158 (H) 65 - 99 mg/dL  Glucose, capillary     Status: Abnormal   Collection Time: 02/04/17  6:57 AM  Result Value Ref Range   Glucose-Capillary 126 (H) 65 - 99 mg/dL     HEENT: Normocephalic, atraumatic Cardio: RRR and no JVD Resp: CTA B/L and Unlabored GI: BS positive and ND Skin:   Healed R AKA.  Neuro: Alert and oriented  Motor 4/5 in R delt, bi, tri, grip, 5/5 LUE and LLE, 4+/5 R HF  (Improving) No ataxia HOH Musc/Skel:  R AKA. No edema, no tenderness Gen: NAD. Vital signs reviewed  Assessment/Plan: 1. Functional deficits secondary to Left pontine infarct which require 3+ hours per day of interdisciplinary therapy in a comprehensive inpatient rehab setting. Physiatrist is providing close team supervision and 24 hour management of active medical problems listed below. Physiatrist and rehab team continue to assess barriers to discharge/monitor patient progress toward functional and medical goals. FIM: Function - Bathing Position: Wheelchair/chair at sink Body parts bathed by patient: Right arm, Left arm, Chest, Abdomen, Front perineal area, Right upper leg, Left upper leg, Buttocks, Right lower leg Body parts bathed by helper: Left lower leg, Buttocks Bathing not applicable: Right lower leg Assist Level: Supervision or verbal cues  Function- Upper Body Dressing/Undressing What is the patient wearing?: Pull over shirt/dress, Bra Bra - Perfomed by patient: Hook/unhook bra (pull down sports bra), Thread/unthread left bra strap, Thread/unthread right bra strap Bra - Perfomed by helper: Hook/unhook bra (pull down sports bra) Pull over shirt/dress - Perfomed by patient: Thread/unthread right sleeve, Thread/unthread left sleeve, Put head through opening, Pull shirt over trunk Assist Level: Set up, Supervision or verbal cues Function - Lower Body Dressing/Undressing What is the patient wearing?: Underwear, Pants, Non-skid slipper socks Position:  Wheelchair/chair at [employer] - Performed by patient: Thread/unthread right underwear leg, Thread/unthread left underwear leg, Pull underwear up/down Underwear - Performed by helper: Pull underwear up/down Pants- Performed by patient: Thread/unthread left pants leg, Pull pants up/down, Thread/unthread right pants leg Pants- Performed by helper: Pull pants up/down Non-skid slipper socks- Performed by patient: Don/doff left  sock Assist for footwear: Supervision/touching assist Assist for lower body dressing: Supervision or verbal cues  Function - Toileting Toileting steps completed by patient: Performs perineal hygiene Toileting steps completed by helper: Adjust clothing prior to toileting, Adjust clothing after toileting Toileting Assistive Devices: Grab bar or rail Assist level: Supervision or verbal cues  Function - Archivist transfer assistive device: Elevated toilet seat/BSC over toilet, Grab bar Assist level to toilet: Supervision or verbal cues Assist level from toilet: Supervision or verbal cues  Function - Chair/bed transfer Chair/bed transfer method: Squat pivot Chair/bed transfer assist level: Touching or steadying assistance (Pt > 75%) (from lower surface, towards L side) Chair/bed transfer assistive device: Armrests, Walker  Function - Locomotion: Wheelchair Will patient use wheelchair at discharge?: Yes Type: Manual Max wheelchair distance: 200 Assist Level: No help, No cues, assistive device, takes more than reasonable amount of time Assist Level: No help, No cues, assistive device, takes more than reasonable amount of time Assist Level: No help, No cues, assistive device, takes more than reasonable amount of time Turns around,maneuvers to table,bed, and toilet,negotiates 3% grade,maneuvers on rugs and over doorsills: Yes Function - Locomotion: Ambulation Ambulation activity did not occur: Safety/medical concerns Assistive device: Walker-rolling Max distance: 5' Assist level: Touching or steadying assistance (Pt > 75%) Walk 10 feet activity did not occur: Safety/medical concerns Walk 50 feet with 2 turns activity did not occur: Safety/medical concerns Walk 150 feet activity did not occur: Safety/medical concerns Walk 10 feet on uneven surfaces activity did not occur: Safety/medical concerns  Function - Comprehension Comprehension: Auditory Comprehension assist level:  Follows complex conversation/direction with extra time/assistive device  Function - Expression Expression: Verbal Expression assist level: Expresses complex ideas: With extra time/assistive device  Function - Social Interaction Social Interaction assist level: Interacts appropriately with others - No medications needed.  Function - Problem Solving Problem solving assist level: Solves complex problems: With extra time  Function - Memory Memory assist level: Recognizes or recalls 90% of the time/requires cueing < 10% of the time Patient normally able to recall (first 3 days only): Current season, Location of own room, Staff names and faces, That he or she is in a hospital  Medical Problem List and Plan: 1. Right hemiparesissecondary to left pontine infarct. Hx of right AKA -Cont CIR  -Not a prosthetic user AKA 2013    2. DVT Prophylaxis/Anticoagulation: Pharmaceutical: Lovenox 3. Chronic HA/back pain/Pain Management: cymbalta--unable to tolerate higher doses.  4. Mood: LCSW to follow for evaluation and support.  5. Neuropsych: This patient is capable of making decisions on herown behalf. 6. Skin/Wound Care: routine pressure relief measures. Maintain adequate nutritional and hydration status.  7. Fluids/Electrolytes/Nutrition: Monitor I/Os 8. T2DM: Monitor BS ac/hs. CBG (last 3)   Recent Labs  02/03/17 1621 02/03/17 2101 02/04/17 0657  GLUCAP 127* 158* 126*   Relatively controlled on 9/2 9. HTN: Monitor BP bid--goal 130-160 to avoid hypoperfusion due to severe ICA stenosis.  No BP meds, started IVF qhs Vitals:   02/03/17 1508 02/04/17 0638  BP: 118/66 127/80  Pulse: 85 87  Resp: 18 18  Temp: 97.9 F (36.6 C) 97.9 F (36.6 C)  SpO2: 100%    Relatively controlled on 9/2 10. CKD: Monitor intake-- avoid nephrotoxic medications.   Cr. 1.31 on 8/29  Cont to monitor 11. Peripheral neuropathy/phantom pain: On gabapentin and duloxetine.   -pt does not wish to change dose 12. IBS-Chronic constipation: Change miralax (causing bloating/diarrhea) to Senna S.   LOS (Days) 5 A FACE TO FACE EVALUATION WAS PERFORMED  Conley Delisle Karis Jubanil Vauda Salvucci 02/04/2017, 7:10 AM

## 2017-02-04 NOTE — Progress Notes (Signed)
Occupational Therapy Session Note  Patient Details  Name: Carolyn Werner MRN: [id number] Date of Birth: 07/20/1921  Today's Date: 02/04/2017 OT Individual Time: 5621-30861615-1645 OT Individual Time Calculation (min): 30 min    Short Term Goals: Week 1:  OT Short Term Goal 1 (Week 1): STG=LTG due to LOS  Skilled Therapeutic Interventions/Progress Updates:    1;1. No pain reported. Pt propels w/c part way to/from dayroom for improved BUE strength/conditioning. OT challenges pt to try to use small crochet hook in R hand to make a chain stitch to utilize favorite leisure activities to create normal movement in RUE. Pt able to make 30 stitches of equal size. Pt completes 3 standing trials with LUE support and min A for standing balance to play a game of connect four with min VC for posture and RUE use. Exited session with pt seated in w/c with call light in reach and NT in room.  Therapy Documentation Precautions:  Precautions Precautions: Fall Restrictions Weight Bearing Restrictions: No  See Function Navigator for Current Functional Status.   Therapy/Group: Individual Therapy  Shon HaleStephanie M Andreka Stucki 02/04/2017, 4:59 PM

## 2017-02-05 ENCOUNTER — Inpatient Hospital Stay (HOSPITAL_COMMUNITY): Payer: Medicare Other | Admitting: Occupational Therapy

## 2017-02-05 ENCOUNTER — Inpatient Hospital Stay (HOSPITAL_COMMUNITY): Payer: Medicare Other | Admitting: Physical Therapy

## 2017-02-05 LAB — GLUCOSE, CAPILLARY
GLUCOSE-CAPILLARY: 137 mg/dL — AB (ref 65–99)
GLUCOSE-CAPILLARY: 165 mg/dL — AB (ref 65–99)
GLUCOSE-CAPILLARY: 143 mg/dL — AB (ref 65–99)
Glucose-Capillary: 170 mg/dL — ABNORMAL HIGH (ref 65–99)

## 2017-02-05 NOTE — Progress Notes (Addendum)
Social Work Patient ID: Carolyn IvoryJuanita Werner, female   DOB: 08/26/1921, 83 y.o.   MRN: [id number]  Team feels pt will be ready for discharge home on Wed-9/5. Awaiting MD approval. MD feels pt is medically ready for DC on Wed. Pt very pleased and prefers [facility] since has had them before. Will make referral and work on discharge for Wed.

## 2017-02-05 NOTE — Progress Notes (Signed)
Occupational Therapy Session Note  Patient Details  Name: Carolyn Werner MRN: [id number] Date of Birth: 11/23/1921   Today's Date: 02/05/2017  Session 1 OT Individual Time: 9371-6967 OT Individual Time Calculation (min): 56 min   Session 2 OT Individual Time: 1300-1400 OT Individual Time Calculation (min): 60 min   Short Term Goals: Week 1:  OT Short Term Goal 1 (Week 1): STG=LTG due to LOS  Skilled Therapeutic Interventions/Progress Updates:    Session 1 Pt greeted in wc and ready for OT treatment session focused on modified bathing/dressing, activity tolerance, and functional use of R UE. Pt collected clothing wc level with focus on use of R to open drawers and grasp clothing. Pt completed stand-pivot<>tub bench in shower using grab bars with close supervision. Pt bathed with set-up A and min guard A for balance when standing to wash buttocks and peri-area.  UB dressing completed with supervision. Weight bearing through R UE when standing to pull pants up over hips-min guard A for balance. Pt needed extended rest break s/p standing with some SOB but recovered quickly with deep breathing techniques. Incorporated functional use of R UE with all grooming tasks. Pt able to grasp comb and bring to head with increased time to coordinate. Pt left seated in wc with needs met.   Session 2 OT treatment session focused on UB strengthening, B UE coordination and R hand fine motor skills. Pt propelled wc from tile to carpet surface with increased time and effort to push on carpet. Worked on B UE coordination, timing, and sequencing to propel wc straight, then around corners. Pt completed 6 mins on arm bike on level 1.5 at slower pace, but without rest break. Pt then worked on R fine motor skills focused on increased smoothness of writing using graded line tracing. Utilized larger crayons and built up grips. Added wedge under R UE to promote wrist extension. Pt returned to room at end of session and left  seated in wc with needs met.   Therapy Documentation Precautions:  Precautions Precautions: Fall Restrictions Weight Bearing Restrictions: No Pain:  none/denies pain  See Function Navigator for Current Functional Status.   Therapy/Group: Individual Therapy  Valma Cava 02/05/2017, 2:05 PM

## 2017-02-05 NOTE — Progress Notes (Signed)
Physical Therapy Session Note  Patient Details  Name: Carolyn Werner MRN: [id number] Date of Birth: May 10, 1922  Today's Date: 02/05/2017 PT Individual Time: 1400-1510 PT Individual Time Calculation (min): 70 min   Short Term Goals: Week 1:  PT Short Term Goal 1 (Week 1): STG=LTG due to ELOS   Skilled Therapeutic Interventions/Progress Updates:    no c/o pain, session focus on activity tolerance and RUE use.    Pt propels w/c from unit>outdoors with mod I and occasional rest breaks for UE fatigue.  Pt able to propel w/c on a variety of surfaces, up/down inclines, through doorways, and on/off elevators.  Discussed potential for d/c in a few days if medically ready and pt agrees that she is ready to go home.  On return to unit, pt completes bimanual UE task of basketball game with min cues to fully utilize RUE.  Sit<>stand x2 with close supervision and pt able to tolerate static stance x2 minutes +1.5 minutes.  Pt returned to room at end of session and positioned in w/c with call bell in reach and needs met.   Therapy Documentation Precautions:  Precautions Precautions: Fall Restrictions Weight Bearing Restrictions: No   See Function Navigator for Current Functional Status.   Therapy/Group: Individual Therapy  Michel Santee 02/05/2017, 3:11 PM

## 2017-02-05 NOTE — Plan of Care (Signed)
Problem: RH BOWEL ELIMINATION Goal: RH STG MANAGE BOWEL WITH ASSISTANCE STG Manage Bowel with Min.Assistance.  Outcome: Not Progressing LBM 8/31- getting suppository after therapies today Goal: RH STG MANAGE BOWEL W/MEDICATION W/ASSISTANCE STG Manage Bowel with Medication with min.Assistance.  Outcome: Not Progressing Suppository to be given after therapies have been completed for the day

## 2017-02-05 NOTE — Progress Notes (Signed)
Subjective/Complaints: Pt seen sitting up in her chair this AM.  She states she slept well overnight, but could use some more sleep.   ROS: Denies CP, SOB, N/V/D  Objective: Vital Signs: Blood pressure (!) 148/71, pulse 89, temperature 98.2 F (36.8 C), temperature source Oral, resp. rate 16, height 5\' 4"  (1.626 m), weight 58.4 kg (128 lb 11.2 oz), SpO2 99 %. No results found. Results for orders placed or performed during the hospital encounter of 01/30/17 (from the past 72 hour(s))  Glucose, capillary     Status: Abnormal   Collection Time: 02/02/17 11:38 AM  Result Value Ref Range   Glucose-Capillary 157 (H) 65 - 99 mg/dL  Glucose, capillary     Status: Abnormal   Collection Time: 02/02/17  4:46 PM  Result Value Ref Range   Glucose-Capillary 185 (H) 65 - 99 mg/dL  Glucose, capillary     Status: Abnormal   Collection Time: 02/02/17  9:27 PM  Result Value Ref Range   Glucose-Capillary 135 (H) 65 - 99 mg/dL  Glucose, capillary     Status: Abnormal   Collection Time: 02/03/17  6:21 AM  Result Value Ref Range   Glucose-Capillary 134 (H) 65 - 99 mg/dL   Comment 1 Notify RN   Glucose, capillary     Status: Abnormal   Collection Time: 02/03/17 12:01 PM  Result Value Ref Range   Glucose-Capillary 134 (H) 65 - 99 mg/dL  Glucose, capillary     Status: Abnormal   Collection Time: 02/03/17  4:21 PM  Result Value Ref Range   Glucose-Capillary 127 (H) 65 - 99 mg/dL  Glucose, capillary     Status: Abnormal   Collection Time: 02/03/17  9:01 PM  Result Value Ref Range   Glucose-Capillary 158 (H) 65 - 99 mg/dL  Glucose, capillary     Status: Abnormal   Collection Time: 02/04/17  6:57 AM  Result Value Ref Range   Glucose-Capillary 126 (H) 65 - 99 mg/dL  Glucose, capillary     Status: Abnormal   Collection Time: 02/04/17 11:39 AM  Result Value Ref Range   Glucose-Capillary 243 (H) 65 - 99 mg/dL   Comment 1 Document in Chart   Glucose, capillary     Status: Abnormal   Collection Time:  02/04/17  4:48 PM  Result Value Ref Range   Glucose-Capillary 112 (H) 65 - 99 mg/dL  Glucose, capillary     Status: Abnormal   Collection Time: 02/04/17  8:49 PM  Result Value Ref Range   Glucose-Capillary 146 (H) 65 - 99 mg/dL  Glucose, capillary     Status: Abnormal   Collection Time: 02/05/17  6:41 AM  Result Value Ref Range   Glucose-Capillary 137 (H) 65 - 99 mg/dL     HEENT: Normocephalic, atraumatic Cardio: RRR and no JVD Resp: CTA B/L and Unlabored GI: BS positive and ND Skin:   Healed R AKA.  Neuro: Alert and oriented  Motor 4+/5 in R delt, bi, tri, grip, 5/5 LUE and LLE, 4+/5 R HF  No ataxia HOH Musc/Skel:  R AKA. No edema, no tenderness Gen: NAD. Vital signs reviewed  Assessment/Plan: 1. Functional deficits secondary to Left pontine infarct which require 3+ hours per day of interdisciplinary therapy in a comprehensive inpatient rehab setting. Physiatrist is providing close team supervision and 24 hour management of active medical problems listed below. Physiatrist and rehab team continue to assess barriers to discharge/monitor patient progress toward functional and medical goals. FIM: Function - Bathing Position:  Wheelchair/chair at sink Body parts bathed by patient: Right arm, Left arm, Chest, Abdomen, Front perineal area, Buttocks, Right upper leg, Left upper leg, Right lower leg, Left lower leg Body parts bathed by helper: Back Bathing not applicable: Right lower leg Assist Level: Supervision or verbal cues  Function- Upper Body Dressing/Undressing What is the patient wearing?: Bra, Pull over shirt/dress Bra - Perfomed by patient: Thread/unthread right bra strap, Thread/unthread left bra strap, Hook/unhook bra (pull down sports bra) Bra - Perfomed by helper: Hook/unhook bra (pull down sports bra) Pull over shirt/dress - Perfomed by patient: Thread/unthread right sleeve, Thread/unthread left sleeve, Put head through opening, Pull shirt over trunk Assist Level:  Supervision or verbal cues, Set up Set up : To obtain clothing/put away Function - Lower Body Dressing/Undressing What is the patient wearing?: Underwear, Pants Position: Wheelchair/chair at sink Underwear - Performed by patient: Thread/unthread right underwear leg, Thread/unthread left underwear leg, Pull underwear up/down Underwear - Performed by helper: Pull underwear up/down Pants- Performed by patient: Thread/unthread right pants leg, Thread/unthread left pants leg, Pull pants up/down Pants- Performed by helper: Pull pants up/down Non-skid slipper socks- Performed by patient: Don/doff left sock Assist for footwear: Supervision/touching assist Assist for lower body dressing: Supervision or verbal cues  Function - Toileting Toileting steps completed by patient: Adjust clothing after toileting, Adjust clothing prior to toileting, Performs perineal hygiene Toileting steps completed by helper: Adjust clothing prior to toileting Toileting Assistive Devices: Grab bar or rail Assist level: Supervision or verbal cues  Function Programmer, multimedia- Toilet Transfers Toilet transfer assistive device: Grab bar, Elevated toilet seat/BSC over toilet Assist level to toilet: Supervision or verbal cues Assist level from toilet: Supervision or verbal cues  Function - Chair/bed transfer Chair/bed transfer method: Squat pivot Chair/bed transfer assist level: Touching or steadying assistance (Pt > 75%) (from lower surface, towards L side) Chair/bed transfer assistive device: Armrests, Walker  Function - Locomotion: Wheelchair Will patient use wheelchair at discharge?: Yes Type: Manual Max wheelchair distance: 200 Assist Level: No help, No cues, assistive device, takes more than reasonable amount of time Assist Level: No help, No cues, assistive device, takes more than reasonable amount of time Assist Level: No help, No cues, assistive device, takes more than reasonable amount of time Turns around,maneuvers to  table,bed, and toilet,negotiates 3% grade,maneuvers on rugs and over doorsills: Yes Function - Locomotion: Ambulation Ambulation activity did not occur: Safety/medical concerns Assistive device: Walker-rolling Max distance: 5' Assist level: Touching or steadying assistance (Pt > 75%) Walk 10 feet activity did not occur: Safety/medical concerns Walk 50 feet with 2 turns activity did not occur: Safety/medical concerns Walk 150 feet activity did not occur: Safety/medical concerns Walk 10 feet on uneven surfaces activity did not occur: Safety/medical concerns  Function - Comprehension Comprehension: Auditory Comprehension assist level: Follows complex conversation/direction with extra time/assistive device  Function - Expression Expression: Verbal Expression assist level: Expresses complex ideas: With extra time/assistive device  Function - Social Interaction Social Interaction assist level: Interacts appropriately with others with medication or extra time (anti-anxiety, antidepressant).  Function - Problem Solving Problem solving assist level: Solves complex problems: With extra time  Function - Memory Memory assist level: Recognizes or recalls 90% of the time/requires cueing < 10% of the time Patient normally able to recall (first 3 days only): Current season, Location of own room, Staff names and faces, That he or she is in a hospital  Medical Problem List and Plan: 1. Right hemiparesissecondary to left pontine infarct. Hx of right AKA -  Cont CIR  -Not a prosthetic user AKA 2013    2. DVT Prophylaxis/Anticoagulation: Pharmaceutical: Lovenox 3. Chronic HA/back pain/Pain Management: cymbalta--unable to tolerate higher doses.  4. Mood: LCSW to follow for evaluation and support.  5. Neuropsych: This patient is capable of making decisions on herown behalf. 6. Skin/Wound Care: routine pressure relief measures. Maintain adequate nutritional and hydration  status.  7. Fluids/Electrolytes/Nutrition: Monitor I/Os 8. T2DM: Monitor BS ac/hs. CBG (last 3)   Recent Labs  02/04/17 1648 02/04/17 2049 02/05/17 0641  GLUCAP 112* 146* 137*   Relatively controlled on 9/3 9. HTN: Monitor BP bid--goal 130-160 to avoid hypoperfusion due to severe ICA stenosis.  No BP meds, started IVF qhs Vitals:   02/05/17 0525 02/05/17 0750  BP: (!) 148/71   Pulse:  89  Resp: 18 16  Temp: 98.2 F (36.8 C)   SpO2: 100% 99%   One elevation this AM, otherwise, relatively controlled on 9/3 10. CKD: Monitor intake-- avoid nephrotoxic medications.   Cr. 1.31 on 8/29  Labs ordered for tomorrow  Cont to monitor 11. Peripheral neuropathy/phantom pain: On gabapentin and duloxetine.  -pt does not wish to change dose 12. IBS-Chronic constipation: Change miralax (causing bloating/diarrhea) to Senna S.   LOS (Days) 6 A FACE TO FACE EVALUATION WAS PERFORMED  Bathsheba Durrett Karis Juba 02/05/2017, 8:37 AM

## 2017-02-06 ENCOUNTER — Inpatient Hospital Stay (HOSPITAL_COMMUNITY): Payer: Medicare Other | Admitting: Occupational Therapy

## 2017-02-06 ENCOUNTER — Inpatient Hospital Stay (HOSPITAL_COMMUNITY): Payer: Medicare Other | Admitting: Physical Therapy

## 2017-02-06 ENCOUNTER — Inpatient Hospital Stay (HOSPITAL_COMMUNITY): Payer: Medicare Other

## 2017-02-06 DIAGNOSIS — K581 Irritable bowel syndrome with constipation: Secondary | ICD-10-CM

## 2017-02-06 LAB — BASIC METABOLIC PANEL
Anion gap: 9 (ref 5–15)
BUN: 21 mg/dL — AB (ref 6–20)
CALCIUM: 10.2 mg/dL (ref 8.9–10.3)
CO2: 23 mmol/L (ref 22–32)
CREATININE: 1.47 mg/dL — AB (ref 0.44–1.00)
Chloride: 103 mmol/L (ref 101–111)
GFR calc Af Amer: 34 mL/min — ABNORMAL LOW (ref >60)
GFR, EST NON AFRICAN AMERICAN: 30 mL/min — AB (ref >60)
GLUCOSE: 133 mg/dL — AB (ref 65–99)
Potassium: 4.2 mmol/L (ref 3.5–5.1)
Sodium: 135 mmol/L (ref 135–145)

## 2017-02-06 LAB — GLUCOSE, CAPILLARY
GLUCOSE-CAPILLARY: 239 mg/dL — AB (ref 65–99)
Glucose-Capillary: 125 mg/dL — ABNORMAL HIGH (ref 65–99)
Glucose-Capillary: 106 mg/dL — ABNORMAL HIGH (ref 65–99)
Glucose-Capillary: 174 mg/dL — ABNORMAL HIGH (ref 65–99)

## 2017-02-06 LAB — CBC
HEMATOCRIT: 41.3 % (ref 36.0–46.0)
Hemoglobin: 13.7 g/dL (ref 12.0–15.0)
MCH: 29.6 pg (ref 26.0–34.0)
MCHC: 33.2 g/dL (ref 30.0–36.0)
MCV: 89.2 fL (ref 78.0–100.0)
Platelets: 158 10*3/uL (ref 150–400)
RBC: 4.63 MIL/uL (ref 3.87–5.11)
RDW: 12.4 % (ref 11.5–15.5)
WBC: 6.3 10*3/uL (ref 4.0–10.5)

## 2017-02-06 NOTE — Progress Notes (Signed)
Subjective/Complaints:  No issues overnite, feels ready for d/c in am  ROS: Denies CP, SOB, N/V/D  Objective: Vital Signs: Blood pressure (!) 146/70, pulse 77, temperature 97.9 F (36.6 C), temperature source Oral, resp. rate 17, height '5\' 4"'  (1.626 m), weight 58.4 kg (128 lb 11.2 oz), SpO2 99 %. No results found. Results for orders placed or performed during the hospital encounter of 01/30/17 (from the past 72 hour(s))  Glucose, capillary     Status: Abnormal   Collection Time: 02/03/17 12:01 PM  Result Value Ref Range   Glucose-Capillary 134 (H) 65 - 99 mg/dL  Glucose, capillary     Status: Abnormal   Collection Time: 02/03/17  4:21 PM  Result Value Ref Range   Glucose-Capillary 127 (H) 65 - 99 mg/dL  Glucose, capillary     Status: Abnormal   Collection Time: 02/03/17  9:01 PM  Result Value Ref Range   Glucose-Capillary 158 (H) 65 - 99 mg/dL  Glucose, capillary     Status: Abnormal   Collection Time: 02/04/17  6:57 AM  Result Value Ref Range   Glucose-Capillary 126 (H) 65 - 99 mg/dL  Glucose, capillary     Status: Abnormal   Collection Time: 02/04/17 11:39 AM  Result Value Ref Range   Glucose-Capillary 243 (H) 65 - 99 mg/dL   Comment 1 Document in Chart   Glucose, capillary     Status: Abnormal   Collection Time: 02/04/17  4:48 PM  Result Value Ref Range   Glucose-Capillary 112 (H) 65 - 99 mg/dL  Glucose, capillary     Status: Abnormal   Collection Time: 02/04/17  8:49 PM  Result Value Ref Range   Glucose-Capillary 146 (H) 65 - 99 mg/dL  Glucose, capillary     Status: Abnormal   Collection Time: 02/05/17  6:41 AM  Result Value Ref Range   Glucose-Capillary 137 (H) 65 - 99 mg/dL  Glucose, capillary     Status: Abnormal   Collection Time: 02/05/17 11:44 AM  Result Value Ref Range   Glucose-Capillary 165 (H) 65 - 99 mg/dL  Glucose, capillary     Status: Abnormal   Collection Time: 02/05/17  4:43 PM  Result Value Ref Range   Glucose-Capillary 143 (H) 65 - 99 mg/dL   Glucose, capillary     Status: Abnormal   Collection Time: 02/05/17  8:50 PM  Result Value Ref Range   Glucose-Capillary 170 (H) 65 - 99 mg/dL  Glucose, capillary     Status: Abnormal   Collection Time: 02/06/17  6:20 AM  Result Value Ref Range   Glucose-Capillary 125 (H) 65 - 99 mg/dL  Basic metabolic panel     Status: Abnormal   Collection Time: 02/06/17  6:38 AM  Result Value Ref Range   Sodium 135 135 - 145 mmol/L   Potassium 4.2 3.5 - 5.1 mmol/L   Chloride 103 101 - 111 mmol/L   CO2 23 22 - 32 mmol/L   Glucose, Bld 133 (H) 65 - 99 mg/dL   BUN 21 (H) 6 - 20 mg/dL   Creatinine, Ser 1.47 (H) 0.44 - 1.00 mg/dL   Calcium 10.2 8.9 - 10.3 mg/dL   GFR calc non Af Amer 30 (L) >60 mL/min   GFR calc Af Amer 34 (L) >60 mL/min    Comment: (NOTE) The eGFR has been calculated using the CKD EPI equation. This calculation has not been validated in all clinical situations. eGFR's persistently <60 mL/min signify possible Chronic Kidney Disease.  Anion gap 9 5 - 15  CBC     Status: None   Collection Time: 02/06/17  6:38 AM  Result Value Ref Range   WBC 6.3 4.0 - 10.5 K/uL   RBC 4.63 3.87 - 5.11 MIL/uL   Hemoglobin 13.7 12.0 - 15.0 g/dL   HCT 41.3 36.0 - 46.0 %   MCV 89.2 78.0 - 100.0 fL   MCH 29.6 26.0 - 34.0 pg   MCHC 33.2 30.0 - 36.0 g/dL   RDW 12.4 11.5 - 15.5 %   Platelets 158 150 - 400 K/uL     HEENT: Normocephalic, atraumatic Cardio: RRR and no JVD Resp: CTA B/L and Unlabored GI: BS positive and ND Skin:   Healed R AKA.  Neuro: Alert and oriented  Motor 4+/5 in R delt, bi, tri, grip, 5/5 LUE and LLE, 4+/5 R HF  No ataxia HOH Musc/Skel:  R AKA. No edema, no tenderness Gen: NAD. Vital signs reviewed  Assessment/Plan: 1. Functional deficits secondary to Left pontine infarct which require 3+ hours per day of interdisciplinary therapy in a comprehensive inpatient rehab setting. Physiatrist is providing close team supervision and 24 hour management of active medical  problems listed below. Physiatrist and rehab team continue to assess barriers to discharge/monitor patient progress toward functional and medical goals. FIM: Function - Bathing Position: Shower Body parts bathed by patient: Right arm, Left arm, Chest, Abdomen, Front perineal area, Buttocks, Right upper leg, Left upper leg, Left lower leg Body parts bathed by helper: Back Bathing not applicable: Right lower leg Assist Level: Assistive device, Touching or steadying assistance(Pt > 75%) Assistive Device Comment: long-handled sponge  Function- Upper Body Dressing/Undressing What is the patient wearing?: Bra, Pull over shirt/dress Bra - Perfomed by patient: Thread/unthread right bra strap, Thread/unthread left bra strap, Hook/unhook bra (pull down sports bra) Bra - Perfomed by helper: Hook/unhook bra (pull down sports bra) Pull over shirt/dress - Perfomed by patient: Thread/unthread right sleeve, Put head through opening, Pull shirt over trunk, Thread/unthread left sleeve Assist Level: Supervision or verbal cues Set up : To obtain clothing/put away Function - Lower Body Dressing/Undressing What is the patient wearing?: Underwear, Pants, Non-skid slipper socks Position: Wheelchair/chair at sink Underwear - Performed by patient: Thread/unthread right underwear leg, Thread/unthread left underwear leg, Pull underwear up/down Underwear - Performed by helper: Pull underwear up/down Pants- Performed by patient: Thread/unthread right pants leg, Thread/unthread left pants leg, Pull pants up/down Pants- Performed by helper: Pull pants up/down Non-skid slipper socks- Performed by patient: Don/doff left sock Assist for footwear: Supervision/touching assist Assist for lower body dressing: Touching or steadying assistance (Pt > 75%)  Function - Toileting Toileting steps completed by patient: Adjust clothing prior to toileting, Performs perineal hygiene Toileting steps completed by helper: Adjust clothing  after toileting Toileting Assistive Devices: Grab bar or rail Assist level: Supervision or verbal cues  Function Midwife transfer assistive device: Grab bar, Elevated toilet seat/BSC over toilet Assist level to toilet: Supervision or verbal cues Assist level from toilet: Supervision or verbal cues  Function - Chair/bed transfer Chair/bed transfer method: Squat pivot Chair/bed transfer assist level: Touching or steadying assistance (Pt > 75%) (from lower surface, towards L side) Chair/bed transfer assistive device: Armrests, Walker  Function - Locomotion: Wheelchair Will patient use wheelchair at discharge?: Yes Type: Manual Max wheelchair distance: 300 Assist Level: No help, No cues, assistive device, takes more than reasonable amount of time Assist Level: No help, No cues, assistive device, takes more than reasonable  amount of time Assist Level: No help, No cues, assistive device, takes more than reasonable amount of time Turns around,maneuvers to table,bed, and toilet,negotiates 3% grade,maneuvers on rugs and over doorsills: Yes Function - Locomotion: Ambulation Ambulation activity did not occur: Safety/medical concerns Assistive device: Walker-rolling Max distance: 5' Assist level: Touching or steadying assistance (Pt > 75%) Walk 10 feet activity did not occur: Safety/medical concerns Walk 50 feet with 2 turns activity did not occur: Safety/medical concerns Walk 150 feet activity did not occur: Safety/medical concerns Walk 10 feet on uneven surfaces activity did not occur: Safety/medical concerns  Function - Comprehension Comprehension: Auditory Comprehension assistive device: Hearing aids Comprehension assist level: Follows complex conversation/direction with extra time/assistive device  Function - Expression Expression: Verbal Expression assist level: Expresses complex ideas: With extra time/assistive device  Function - Social Interaction Social  Interaction assist level: Interacts appropriately with others with medication or extra time (anti-anxiety, antidepressant).  Function - Problem Solving Problem solving assist level: Solves complex problems: With extra time  Function - Memory Memory assist level: Recognizes or recalls 90% of the time/requires cueing < 10% of the time Patient normally able to recall (first 3 days only): Current season, Location of own room, Staff names and faces, That he or she is in a hospital  Medical Problem List and Plan: 1. Right hemiparesissecondary to left pontine infarct. Hx of right AKA -Cont CIR PT, OT tent d/c in am -Not a prosthetic user AKA 2013    2. DVT Prophylaxis/Anticoagulation: Pharmaceutical: Lovenox 3. Chronic HA/back pain/Pain Management: cymbalta--unable to tolerate higher doses.  4. Mood: LCSW to follow for evaluation and support.  5. Neuropsych: This patient is capable of making decisions on herown behalf. 6. Skin/Wound Care: routine pressure relief measures. Maintain adequate nutritional and hydration status.  7. Fluids/Electrolytes/Nutrition: Monitor I/Os 8. T2DM: Monitor BS ac/hs. CBG (last 3)   Recent Labs  02/05/17 1643 02/05/17 2050 02/06/17 0620  GLUCAP 143* 170* 125*   Relatively controlled on 9/4 9. HTN: Monitor BP bid--goal 130-160 to avoid hypoperfusion due to severe ICA stenosis.  No BP meds, started IVF qhs Vitals:   02/05/17 1525 02/06/17 0551  BP: 113/70 (!) 146/70  Pulse: 88 77  Resp: 17 17  Temp: 98.1 F (36.7 C) 97.9 F (36.6 C)  SpO2: 99% 99%   Minimal elevation this AM, otherwise, relatively controlled on 9/4 10. CKD: Monitor intake-- avoid nephrotoxic medications.    Creat Stable 1.47 9/4  Cont to monitor 11. Peripheral neuropathy/phantom pain: On gabapentin and duloxetine.  -pt does not wish to change dose 12. IBS-Chronic constipation: Change miralax (causing bloating/diarrhea) to Senna S.   LOS  (Days) 7 A FACE TO FACE EVALUATION WAS PERFORMED  Jerimyah Vandunk E 02/06/2017, 7:20 AM

## 2017-02-06 NOTE — Progress Notes (Signed)
Recreational Therapy Session Note  Patient Details  Name: Daniele Dillow MRN: [id number] Date of Birth: [birth year] Today's Date: 02/06/2017  Pain: no c/o Skilled Therapeutic Interventions/Progress Updates:  Goals:  Pt will identify at least 2 leisure tasks for completion at discharge at Mod I level.  Session focused on discharge planning in regards to leisure pursuits.  Pt able to state 3 leisure tasks for completion at Mod I seated level.  Activity analysis also completed with potential modifications for safe task completion.  Discussed home management tasks of simple meal prep, kitchen tasks and leisure activities with focus on RUE use.  Pt stated she is excited to return home and plans to stay as active as she can and engaged in activities of choice.  Goal met.  [place] 02/06/2017, 1:45 PM

## 2017-02-06 NOTE — Progress Notes (Signed)
Recreational Therapy Discharge Summary Patient Details  Name: Carolyn Werner MRN: [id number] Date of Birth: June 13, 1921 Today's Date: 02/06/2017  Comments on progress toward goals:  Pt has made great progress toward goal and is ready for discharge home at Mod I w/c level for simple TR tasks.  TR sessions focused on safety & leisure participation with potential modifications.  Goal met.  Reasons for discharge: discharge from hospital  Patient/family agrees with progress made and goals achieved: Yes  Tamika Shropshire 02/06/2017, 1:52 PM

## 2017-02-06 NOTE — Plan of Care (Signed)
Problem: RH Ambulation Goal: LTG Patient will ambulate in controlled environment (PT) LTG: Patient will ambulate in a controlled environment, # of feet with assistance (PT).  Outcome: Not Met (add Reason) Pt is discharging at w/c level, ambulation not necessary

## 2017-02-06 NOTE — Progress Notes (Signed)
Physical Therapy Discharge Summary  Patient Details  Name: Carolyn Werner MRN: [id number] Date of Birth: 1922/03/11  Today's Date: 02/06/2017 PT Individual Time: 2330-0762 PT Individual Time Calculation (min): 59 min    Patient has met 6 of 7 long term goals due to improved activity tolerance, improved balance, improved postural control, increased strength, ability to compensate for deficits and functional use of  right upper extremity.  Patient to discharge at a wheelchair level Modified Independent.    Reasons goals not met: Pt did not meet ambulation goal as pt will be discharging at a w/c level and able to perform transfers stand pivot mod I.   Recommendation:  Patient will benefit from ongoing skilled PT services in home health setting to continue to advance safe functional mobility, address ongoing impairments in strength, ROM, functional mobility, balance, and minimize fall risk.  Equipment: No equipment provided, pt already has equipment at home   Reasons for discharge: treatment goals met  Patient/family agrees with progress made and goals achieved: Yes   Treatment Interventions: Pt seated in w/c upon PT arrival, agreeable to therapy tx and denies pain. Pt propelled w/c throughout unit >300 ft Mod I using B UEs. Pt performed car transfer with supervision, performed bed transfer and furniture transfers Mod I, performed sit<>stand with supervision and performed all bed mobility Mod I. Worked on dynamic standing balance with supervision and single UE support to play connect 4, standing 2 x 3 minutes. Pt transferred from w/c <> nustep Mod I and used the nustep for UE/LE strengthening, ROM and endurance x 8 minutes on workload 3. Pt left in w/c at end of session with needs in reach.   PT Discharge Precautions/Restrictions Precautions Precautions: Fall Precaution Comments: previous R AKA from ~2013 Restrictions Weight Bearing Restrictions: No Other Position/Activity  Restrictions: R AKA with phantom pain and L Knee and leg very sensitive from PVD Vital Signs Therapy Vitals Temp: (!) 97.4 F (36.3 C) Temp Source: Oral Pulse Rate: 89 Resp: 18 BP: 133/70 Patient Position (if appropriate): Sitting Oxygen Therapy SpO2: 100 % O2 Device: Not Delivered Pain  denies pain this session Vision/Perception  Vision - History Baseline Vision: Wears glasses all the time Patient Visual Report: No change from baseline Perception Perception: Within Functional Limits Praxis Praxis: Intact  Cognition Overall Cognitive Status: Within Functional Limits for tasks assessed Arousal/Alertness: Awake/alert Orientation Level: Oriented X4 Attention: Sustained Sustained Attention: Appears intact Memory: Appears intact Awareness: Appears intact Problem Solving: Appears intact Safety/Judgment: Appears intact Sensation Sensation Light Touch: Appears Intact Stereognosis: Not tested Hot/Cold: Not tested Proprioception: Appears Intact Coordination Gross Motor Movements are Fluid and Coordinated: No (limited secondary to R AKA ) Fine Motor Movements are Fluid and Coordinated: Yes Finger Nose Finger Test: Improved R hand strength/coordination Motor  Motor Motor: Hemiplegia Motor - Discharge Observations: Improved R fine motor control  Trunk/Postural Assessment  Cervical Assessment Cervical Assessment: Within Functional Limits Thoracic Assessment Thoracic Assessment: Exceptions to [facility] (thoracic rounding) Lumbar Assessment Lumbar Assessment: Exceptions to [facility] (posterior pelvic tilt) Postural Control Postural Control: Within Functional Limits  Balance Balance Balance Assessed: Yes Static Sitting Balance Static Sitting - Level of Assistance: 6: Modified independent (Device/Increase time) Dynamic Sitting Balance Dynamic Sitting - Balance Support: During functional activity Dynamic Sitting - Level of Assistance: 6: Modified independent (Device/Increase  time) Static Standing Balance Static Standing - Balance Support: During functional activity;Left upper extremity supported Static Standing - Level of Assistance: 6: Modified independent (Device/Increase time) Dynamic Standing Balance Dynamic Standing - Balance Support:  During functional activity;Left upper extremity supported Dynamic Standing - Level of Assistance: 6: Modified independent (Device/Increase time) Extremity Assessment  RUE Assessment RUE Assessment: Within Functional Limits LUE Assessment LUE Assessment: Within Functional Limits RLE Assessment RLE Assessment: Exceptions to [facility] RLE Strength RLE Overall Strength Comments: R AKA. Hip flexion 4+/5 hip adduction/abduction and extension 4/5  LLE Assessment LLE Assessment: Exceptions to [facility] LLE Strength LLE Overall Strength Comments: Grossly 4/5 to 4+/5 proximal to distal   See Function Navigator for Current Functional Status.  Netta Corrigan, PT, DPT 02/06/2017, 2:14 PM

## 2017-02-06 NOTE — Progress Notes (Signed)
Social Work Patient ID: Carolyn Werner, female   DOB: 1922/02/22, 83 y.o.   MRN: [id number]  Spoke with daughter-Carolyn Werner via to discuss discharge tomorrow. She has met her goals and is ready. Her daughter plans to keep her home this week then return to [place]. Will work on discharge needs.

## 2017-02-06 NOTE — Progress Notes (Signed)
Physical Therapy Session Note  Patient Details  Name: Carolyn Werner MRN: [id number] Date of Birth: 1921/08/05  Today's Date: 02/06/2017 PT Individual Time: 0108-1065 PT Individual Time Calculation (min): 42 min   Short Term Goals: Week 1:  PT Short Term Goal 1 (Week 1): STG=LTG due to ELOS   Skilled Therapeutic Interventions/Progress Updates:   Pt in w/c upon arrival and agreeable to therapy, no c/o pain. Worked on activity tolerance and static/dynamic sitting balance w/o back or UE support. Pt self-propelled w/c to therapy gym, Mod I, and transferred to edge of mat, Mod I as well. Pt maintained static/dynamic sitting balance at edge of mat w/o back support or UE support for >20 minutes. Emphasis on reaching outside of BOS and utilizing affected RUE for tasks. Ended session in w/c, call bell within reach and all needs met.   Therapy Documentation Precautions:  Precautions Precautions: Fall Precaution Comments: previous R AKA from ~2013 Restrictions Weight Bearing Restrictions: No Other Position/Activity Restrictions: R AKA with phantom pain and L Knee and leg very sensitive from PVD Vital Signs: Therapy Vitals Temp: (!) 97.4 F (36.3 C) Temp Source: Oral Pulse Rate: 89 Resp: 18 BP: 133/70 Patient Position (if appropriate): Sitting Oxygen Therapy SpO2: 100 % O2 Device: Not Delivered  See Function Navigator for Current Functional Status.   Therapy/Group: Individual Therapy  Shene Maxfield K Arnette 02/06/2017, 4:33 PM

## 2017-02-06 NOTE — Progress Notes (Signed)
Occupational Therapy Session Note  Patient Details  Name: Carolyn IvoryJuanita Nyman MRN: [id number] Date of Birth: 05/23/1922  Today's Date: 02/06/2017 OT Individual Time: 1500-1530 OT Individual Time Calculation (min): 30 min    Short Term Goals: Week 1:  OT Short Term Goal 1 (Week 1): STG=LTG due to LOS  Skilled Therapeutic Interventions/Progress Updates:    Upon entering the room, pt seated in wheelchair awaiting therapist with no c/o pain this session. Pt engaged in [facility] task with use of tan resistive theraputty. OT provided demonstrations and pt returned demonstrations with min verbal cues for proper technique. Pt remained seated in wheelchair at end of session with call bell and all needed items within reach.   Therapy Documentation Precautions:  Precautions Precautions: Fall Precaution Comments: previous R AKA from ~2013 Restrictions Weight Bearing Restrictions: No Other Position/Activity Restrictions: R AKA with phantom pain and L Knee and leg very sensitive from PVD General:   Vital Signs: Therapy Vitals Temp: (!) 97.4 F (36.3 C) Temp Source: Oral Pulse Rate: 89 Resp: 18 BP: 133/70 Patient Position (if appropriate): Sitting Oxygen Therapy SpO2: 100 % O2 Device: Not Delivered Pain:   ADL: ADL Equipment Provided: Sock aid Eating: Independent Grooming: Modified independent Where Assessed-Grooming: Wheelchair Upper Body Bathing: Setup Where Assessed-Upper Body Bathing: Shower Lower Body Bathing: Supervision/safety, Setup Where Assessed-Lower Body Bathing: Shower Upper Body Dressing: Independent Lower Body Dressing: Modified independent Where Assessed-Lower Body Dressing: Wheelchair Toileting: Modified independent Where Assessed-Toileting: Teacher, adult educationToilet Toilet Transfer: Modified Community education officerindependent Toilet Transfer Method: Surveyor, mineralstand pivot Toilet Transfer Equipment: Grab bars (BSC over toilet) Tub/Shower Transfer: Distant supervision Tub/Shower Transfer Method: Stand  pivot Tub/Shower Equipment: Transfer tub bench ADL Comments: Please see functional navigator  Vision   Perception  Perception: Within Functional Limits Praxis Praxis: Intact Exercises:   Other Treatments:    See Function Navigator for Current Functional Status.   Therapy/Group: Individual Therapy  Alen BleacherBradsher, Marsa Matteo P 02/06/2017, 4:21 PM

## 2017-02-06 NOTE — Progress Notes (Signed)
Social Work  Discharge Note  The overall goal for the admission was met for:   Discharge location: Yes-HOME WITH DAUGHTER-MOD/I [facility] LEVEL  Length of Stay: Yes-9 DAYS  Discharge activity level: Yes-MOD/I [place]  Home/community participation: Yes  Services provided included: MD, RD, PT, OT, RN, CM, Pharmacy and SW  Financial Services: Medicare and Private Insurance: [place]  Follow-up services arranged: Home Health: [facility]-PT & OT and Patient/Family request agency HH: HAD THEM BEFORE, DME: NO NEEDS  Comments (or additional information):PATIENT DID WELL AND REACHED HER GOALS QUICKLY AND WAS READY TO [place]. TO RETURN TO [employer].   Patient/Family verbalized understanding of follow-up arrangements: Yes  Individual responsible for coordination of the follow-up plan: [facility] AND PATIENT  Confirmed correct DME delivered: Elease Hashimoto 02/06/2017    Elease Hashimoto

## 2017-02-06 NOTE — Progress Notes (Signed)
Occupational Therapy Discharge Summary  Patient Details  Name: Carolyn Werner MRN: [id number]  Date of Birth: 1921-12-27  Today's Date: 02/06/2017 OT Individual Time: 1000-1100 OT Individual Time Calculation (min): 60 min   OT treatment session focused on increased independence with bathing/dressing tasks. Pt declined shower today but completed sponge bath at the sink with set-up and long handled sponge. Pt able to collect clothing wc level without assistance and completed dressing with mod I with increased time. Sock-aid used to [employer] which pt states she uses everyday at home.   Patient has met 11 of 11 long term goals due to improved activity tolerance, improved balance, postural control, ability to compensate for deficits and functional use of  RIGHT upper extremity.  Patient to discharge at overall Modified Independent/supervision level.  Patient's care partner is independent to provide the necessary physical assistance at discharge.    Reasons goals not met: n/a  Recommendation:  Patient will benefit from ongoing skilled OT services in home health setting to continue to advance functional skills in the area of BADL.  Equipment: No equipment provided  Reasons for discharge: treatment goals met and discharge from hospital  Patient/family agrees with progress made and goals achieved: Yes  OT Discharge Precautions/Restrictions  Precautions Precautions: Fall Precaution Comments: previous R AKA from ~2013  Pain   none/denies pain ADL ADL Equipment Provided: Sock aid Eating: Independent Grooming: Modified independent Where Assessed-Grooming: Wheelchair Upper Body Bathing: Setup Where Assessed-Upper Body Bathing: Shower Lower Body Bathing: Supervision/safety, Setup Where Assessed-Lower Body Bathing: Shower Upper Body Dressing: Independent Lower Body Dressing: Modified independent Where Assessed-Lower Body Dressing: Wheelchair Toileting: Modified independent Where  Assessed-Toileting: Risk analyst Method: Arts development officer: Grab bars (BSC over toilet) Tub/Shower Transfer: Distant supervision Tub/Shower Transfer Method: Stand pivot Tub/Shower Equipment: Transfer tub bench ADL Comments: Please see functional navigator  Perception  Perception: Within Functional Limits Praxis Praxis: Intact Cognition Overall Cognitive Status: Within Functional Limits for tasks assessed Arousal/Alertness: Awake/alert Orientation Level: Oriented X4 Safety/Judgment: Appears intact Sensation Sensation Light Touch: Appears Intact Stereognosis: Appears Intact Hot/Cold: Appears Intact Proprioception: Appears Intact Coordination Gross Motor Movements are Fluid and Coordinated: Yes Fine Motor Movements are Fluid and Coordinated: Yes Finger Nose Finger Test: Improved R hand strength/coordination Motor  Motor Motor - Discharge Observations: Improved R fine motor control Balance Dynamic Sitting Balance Dynamic Sitting - Balance Support: During functional activity Dynamic Sitting - Level of Assistance: 6: Modified independent (Device/Increase time) Static Standing Balance Static Standing - Balance Support: During functional activity;Left upper extremity supported Static Standing - Level of Assistance: 6: Modified independent (Device/Increase time) Dynamic Standing Balance Dynamic Standing - Balance Support: During functional activity;Left upper extremity supported Dynamic Standing - Level of Assistance: 6: Modified independent (Device/Increase time) Extremity/Trunk Assessment RUE Assessment RUE Assessment: Within Functional Limits LUE Assessment LUE Assessment: Within Functional Limits   See Function Navigator for Current Functional Status.  Daneen Schick Sutter Ahlgren 02/06/2017, 4:07 PM

## 2017-02-07 LAB — GLUCOSE, CAPILLARY: Glucose-Capillary: 140 mg/dL — ABNORMAL HIGH (ref 65–99)

## 2017-02-07 MED ORDER — TRAMADOL HCL 50 MG PO TABS: 25.0000 mg | ORAL_TABLET | Freq: Two times a day (BID) | ORAL | 0 refills | Status: DC | PRN

## 2017-02-07 MED ORDER — TICAGRELOR 90 MG PO TABS: 90.0000 mg | ORAL_TABLET | Freq: Two times a day (BID) | ORAL | 0 refills | Status: DC

## 2017-02-07 MED ORDER — ACETAMINOPHEN 500 MG PO TABS: 500.0000 mg | ORAL_TABLET | Freq: Four times a day (QID) | ORAL | 0 refills | Status: AC | PRN

## 2017-02-07 MED ORDER — SENNOSIDES-DOCUSATE SODIUM 8.6-50 MG PO TABS: 1.0000 | ORAL_TABLET | Freq: Every day | ORAL | Status: DC

## 2017-02-07 NOTE — Progress Notes (Signed)
Social Work Jaydrien Wassenaar, Elveria Rising Social Worker Signed   Patient Care Conference Date of Service: 02/07/2017  1:52 PM      Hide copied text Hover for attribution information Inpatient RehabilitationTeam Conference and Plan of Care Update Date: 02/07/2017   Time: 11:20 AM      Patient Name: Carolyn Werner      Medical Record Number: [id number]  Date of Birth: [birth year] Sex: Female         Room/Bed: 4M07C/4M07C-01 Payor Info: Payor: MEDICARE / Plan: MEDICARE PART A AND B / Product Type: *No Product type* /     Admitting Diagnosis: CVA  Admit Date/Time:  01/30/2017  4:10 PM Admission Comments: No comment available    Primary Diagnosis:  Embolic stroke [facility]) Principal Problem: Embolic stroke [facility])       Patient Active Problem List    Diagnosis Date Noted  . S/P AKA (above knee amputation) unilateral, right ([name])    . Irritable bowel syndrome    . Diabetic peripheral neuropathy ([name])    . Embolic stroke ([name]) 01/30/2017  . Ataxia, post-stroke    . Diabetes mellitus type 2 in nonobese ([name])    . Stage 3 chronic kidney disease    . History of right above knee amputation ([name])    . History of CVA (cerebrovascular accident)    . Benign essential HTN    . Transient cerebral ischemia    . Cerebral thrombosis with cerebral infarction 01/28/2017  . Right arm weakness 01/26/2017  . Weakness 01/26/2017  . Hyperlipemia 10/05/2016  . Weakness of both arms    . Left pontine stroke ([name]) 07/28/2016  . 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 Slurred speech 07/27/2016  . CKD (chronic kidney disease) stage 3, GFR 30-59 ml/min 07/27/2016  . Diabetes mellitus ([name]) 07/27/2016  . Hypertension 07/27/2016  . Depression 07/27/2016  . Glaucoma 07/27/2016  . Neuropathic pain 07/27/2016  . Asthma 07/27/2016  . Heart palpitations 07/05/2016  . Cardiac murmur 07/05/2016  . Phantom limb pain ([name]) 11/13/2014  . Cervical disc disorder with radiculopathy of  cervical region 11/13/2014      Expected Discharge Date: Expected Discharge Date: 02/07/17   Team Members Present: Physician leading conference: Dr. Claudette Laws Social Worker Present: [name], LCSW Nurse Present: [name], RN PT Present: [name], PT OT Present: [name], OT SLP Present: [name], SLP PPS Coordinator present : [name], RN, CRRN       Current Status/Progress Goal Weekly Team Focus  Medical     Excellent progress with PT and OT, medically remaining stable  Home with family assistance  Discharge planning   Bowel/Bladder     continent of bowel and bladder; LBM 9/3 after suppository  Mod I   Assess and treat for constipation as needed   Swallow/Nutrition/ Hydration               ADL's     Mod I/supervision overall  Mod I/supervision   Pt/family education, dc planning, home fine motor program    Mobility     supervision/mod I  mod I w/c level  d/c planning   Communication               Safety/Cognition/ Behavioral Observations             Pain     Denies pain  < 3  Assess and treat for pain q shift and prn   Skin  Skin intact  Mod I assist  Assess skin q shift and prn     *See Care Plan and progress notes for long and short-term goals.      Barriers to Discharge   Current Status/Progress Possible Resolutions Date Resolved   Physician     Other (comments)  Balance issues  Good progress towards goals at a wheelchair level  Planning discharge today      Nursing                 PT                    OT                 SLP            SW              Discharge Planning/Teaching Needs:  HOme with daughter-aide and to return to adult day care next week. Daughter will be here around 1:00pm to transport home      Team Discussion:  Reached goals of mod/i wheelchair level and medically stable for discharge today. Pt did very well and is ready to go home and pleased with her progress.  Revisions to Treatment  Plan:  DC 9/5    Continued Need for Acute Rehabilitation Level of Care: The patient requires daily medical management by a physician with specialized training in physical medicine and rehabilitation for the following conditions: Daily direction of a multidisciplinary physical rehabilitation program to ensure safe treatment while eliciting the highest outcome that is of practical value to the patient.: Yes Daily medical management of patient stability for increased activity during participation in an intensive rehabilitation regime.: Yes Daily analysis of laboratory values and/or radiology reports with any subsequent need for medication adjustment of medical intervention for : Neurological problems;Other   Texanna Hilburn, Lemar Livingsebecca G 02/07/2017, 1:52 PM          Mi Balla, Lemar Livingsebecca G, LCSW Social Worker Signed   Patient Care Conference Date of Service: 01/31/2017 12:03 PM      Hide copied text Hover for attribution information Inpatient RehabilitationTeam Conference and Plan of Care Update Date: 01/31/2017   Time: 11:30 AM      Patient Name: Carolyn IvoryJuanita Elbaum      Medical Record Number: [id number]  Date of Birth: 11/11/1921 Sex: Female         Room/Bed: 4M07C/4M07C-01 Payor Info: Payor: MEDICARE / Plan: MEDICARE PART A AND B / Product Type: *No Product type* /     Admitting Diagnosis: CVA  Admit Date/Time:  01/30/2017  4:10 PM Admission Comments: No comment available    Primary Diagnosis:  <principal problem not specified> Principal Problem: <principal problem not specified>       Patient Active Problem List    Diagnosis Date Noted  . Embolic stroke ([name]) 01/30/2017  . Ataxia, post-stroke    . Diabetes mellitus type 2 in nonobese ([name])    . Stage 3 chronic kidney disease    . History of right above knee amputation ([name])    . History of CVA (cerebrovascular accident)    . Benign essential HTN    . Transient cerebral ischemia    . Cerebral thrombosis with cerebral infarction 01/28/2017  .  Right arm weakness 01/26/2017  . Weakness 01/26/2017  . Hyperlipemia 10/05/2016  . Weakness of both arms    . Left pontine stroke ([name]) 07/28/2016  . Cerebral infarction due to occlusion of left  vertebral artery ([name])    . Slurred speech 07/27/2016  . CKD (chronic kidney disease) stage 3, GFR 30-59 ml/min 07/27/2016  . Diabetes mellitus ([name]) 07/27/2016  . Hypertension 07/27/2016  . Depression 07/27/2016  . Glaucoma 07/27/2016  . Neuropathic pain 07/27/2016  . Asthma 07/27/2016  . Heart palpitations 07/05/2016  . Cardiac murmur 07/05/2016  . Phantom limb pain ([name]) 11/13/2014  . Cervical disc disorder with radiculopathy of cervical region 11/13/2014      Expected Discharge Date:     Team Members Present: Physician leading conference: Dr. Claudette Laws Social Worker Present: [name], LCSW Nurse Present: Tennis Must, RN PT Present: [name], PT OT Present: [name], OT SLP Present: Jackalyn Lombard, SLP PPS Coordinator present : [name], RN, CRRN       Current Status/Progress Goal Weekly Team Focus  Medical     OT eval pending, PT eval completed  maintain med stability, home with family assist  initiate therapy program   Bowel/Bladder     Continent of B&B.  Maintain continence.  Monitor need for toileting and assist as necessary.   Swallow/Nutrition/ Hydration               ADL's       eval pending        Mobility       eval pending        Communication               Safety/Cognition/ Behavioral Observations             Pain     No c/o pain.  <3.  Monitor for pain and treat as necessary   Skin     Boggy left heel.  Maintain skin integrity.  Keep heel on pillow while in bed.     *See Care Plan and progress notes for long and short-term goals.      Barriers to Discharge   Current Status/Progress Possible Resolutions Date Resolved   Physician     Other (comments)  balance issues     initiate rehab program, anticipate short LOS       Nursing                 PT  Decreased caregiver support;Home environment access/layout                 OT                 SLP            SW              Discharge Planning/Teaching Needs:    Home with daughter who works form home-pt attends adult day care 2x week and has an aide 9 hours per week     Team Discussion:  Evaluations pending-medically looking at BP . Main issue is UE weakness from CVA. Will need to be able to do transfer since alone some during the day.  Revisions to Treatment Plan:  New eval    Continued Need for Acute Rehabilitation Level of Care: The patient requires daily medical management by a physician with specialized training in physical medicine and rehabilitation for the following conditions: Daily direction of a multidisciplinary physical rehabilitation program to ensure safe treatment while eliciting the highest outcome that is of practical value to the patient.: Yes Daily medical management of patient stability for increased activity during participation in an intensive rehabilitation regime.: Yes Daily analysis of laboratory  values and/or radiology reports with any subsequent need for medication adjustment of medical intervention for : Neurological problems   Lucy Chris 01/31/2017, 12:03 PM           Patient ID: Carolyn Werner, female   DOB: 1922/02/12, 83 y.o.   MRN: [id number]

## 2017-02-07 NOTE — Discharge Summary (Signed)
Physician Discharge Summary  Patient ID: Carolyn Werner MRN: [id number] DOB/AGE: Oct 23, 1921 83 y.o.  Admit date: 01/30/2017 Discharge date: 02/07/2017  Discharge Diagnoses:  Principal Problem:   Embolic stroke [facility]) Active Problems:   CKD (chronic kidney disease) stage 3, GFR 30-59 ml/min   Asthma   Ataxia, post-stroke   Diabetes mellitus type 2 in nonobese ([name])   Benign essential HTN   Irritable bowel syndrome   Diabetic peripheral neuropathy ([name])   S/P AKA (above knee amputation) unilateral, right [facility])   Discharged Condition: stable   Significant Diagnostic Studies: N/A   Labs:  Basic Metabolic Panel: BMP Latest Ref Rng & Units 02/06/2017 01/31/2017 01/27/2017  Glucose 65 - 99 mg/dL 409(W) 119(J) 478(G)  BUN 6 - 20 mg/dL 95(A) 15 16  Creatinine 0.44 - 1.00 mg/dL 2.13(Y) 8.65(H) 8.46(N)  Sodium 135 - 145 mmol/L 135 135 137  Potassium 3.5 - 5.1 mmol/L 4.2 4.2 3.9  Chloride 101 - 111 mmol/L 103 102 105  CO2 22 - 32 mmol/L 23 24 24   Calcium 8.9 - 10.3 mg/dL 62.9 52.8 9.8    CBC: CBC Latest Ref Rng & Units 02/06/2017 01/31/2017 01/26/2017  WBC 4.0 - 10.5 K/uL 6.3 6.3 -  Hemoglobin 12.0 - 15.0 g/dL 41.3 24.4 01.0  Hematocrit 36.0 - 46.0 % 41.3 40.2 40.0  Platelets 150 - 400 K/uL 158 166 -    CBG:  Recent Labs Lab 02/06/17 0620 02/06/17 1135 02/06/17 1705 02/06/17 2103 02/07/17 0637  GLUCAP 125* 239* 106* 174* 140*    Brief HPI:   Carolyn Washingtonis a 83 y.o.RH-femalewith history of  T2DM with peripheral neuropathy, CKD-baseline SCr-1.3, R-AKA- wheelchair bound, asthma, chronic back pain/HA, bilateral cerebellar/pontine strokes 07/2016 due to high grade L-ICA stenosis; who was admitted on 01/26/17 with increasing right sided weakness and slurred speech when getting up from sleep. MRI/MRA head done revealing acute infarct in left paramedian pons with concern for acute PCA thrombosis and significant flow limiting stenosis Left supraclinoid and non diagnostic  neck--limited exam. Patient with improving symptoms post admission. CTA  Head/neck recommended but family declined.  2D echo with EF 60-65% with grade one diastolic dysfunction, calcified mitral annulus and lipomatous atrial septal hypertrophy. Dr. Pearlean Brownie recommended ASA and Brilinta for stroke prophylaxis and BP goal 130-160 due to severe vascular stenosis.    Hospital Course: Carolyn Werner was admitted to rehab 01/30/2017 for inpatient therapies to consist of PT and OT at least three hours five days a week. Past admission physiatrist, therapy team and rehab RN have worked together to provide customized collaborative inpatient rehab. Blood pressures have been monitored on bid basis and have been relatively controlled. Po intake has been good and she is continent of bowel and bladder. Renal status has been monitored with serial checks and SCr- is up to 1.47.  CBC showed H/H and platelets are stable. She has had issues with phantom pain at nights which is being managed with low dose ultram prn. She has made good progress during her rehab stay and is modified independent at [facility] level. She will continue to receive follow up HHPT and HHOT by [facility] at home after discharge.    Rehab course: During patient's stay in rehab team conference was held to monitor patient's progress, set goals and discuss barriers to discharge. At admission, patient required min assist with basic self care tasks and mobility. She has had improvement in activity tolerance, balance, postural control, as well as ability to compensate for deficits. She is has had improvement  in functional use RUE  and RLE as well as improved awareness.  She is able to complete ADL tasks at modified independent level with use of AD. She is modified independent for level transfers and is able to maintain dynamic balance at edge of mat without support for > 20 minutes. She is able to stand with supervision for 3 minutes X 2.     Disposition: 01-Home or  Self Care  Diet: heart healthy/diabetic.   Special Instructions: 1. Drink plenty of fluids.  2. Needs BMET rechecked in 1-2 weeks.   Discharge Instructions    Ambulatory referral to Physical Medicine Rehab    Complete by:  As directed    1-2 weeks transitional care appt     Allergies as of 02/07/2017      Reactions   Atacand Hct [candesartan Cilexetil-hctz] Other (See Comments)   Kidney disease   Carbamazepine Other (See Comments)   Changes in ability to read, think clearly, unsteady gait   Erythromycin Diarrhea   Glucophage [metformin Hcl] Other (See Comments)   Chronic low blood sugar   Lactose Intolerance (gi) Diarrhea, Nausea And Vomiting   Nausea & Vomiting; Diarrhea   Lipitor [atorvastatin] Other (See Comments)   Muscle weakness   Nsaids Nausea And Vomiting   Motrin, Naprosyn, Voltaren, Celebrex, Prevacid, Mobic -- abdominal pain and burning, cramps, loose stool   Penicillins Shortness Of Breath, Rash   Has patient had a PCN reaction causing immediate rash, facial/tongue/throat swelling, SOB or lightheadedness with hypotension: Yes Has patient had a PCN reaction causing severe rash involving mucus membranes or skin necrosis: No Has patient had a PCN reaction that required hospitalization: No Has patient had a PCN reaction occurring within the last 10 years: No If all of the above answers are "NO", then may proceed with Cephalosporin use.   Sulfa Antibiotics Anaphylaxis   Ceclor [cefaclor] Hives   Clindamycin/lincomycin Other (See Comments)   Abdominal pain, loss of appetite   Milk-related Compounds    Bloating, abdominal discomfort.   Rosuvastatin Calcium Other (See Comments)   Lethargy, malaise   Dilaudid [hydromorphone Hcl] Rash   Plavix [clopidogrel] Rash, Other (See Comments)   Skin peeled, hair fell out      Medication List    STOP taking these medications   amLODipine 2.5 MG tablet Commonly known as:  NORVASC     TAKE these medications   acetaminophen  500 MG tablet Commonly known as:  TYLENOL Take 1 tablet (500 mg total) by mouth every 6 (six) hours as needed (pain). What changed:  how much to take   aspirin 81 MG EC tablet Take 1 tablet (81 mg total) by mouth daily.   Biotin 5 MG Tabs Take 5 mg by mouth daily with supper.   budesonide 0.25 MG/2ML nebulizer solution Commonly known as:  PULMICORT Take 0.25 mg by nebulization See admin instructions. Inhale 1 vial (0.25 mg) via nebulization every other night   docusate sodium 100 MG capsule Commonly known as:  COLACE Take 200 mg by mouth 2 (two) times daily.   DULoxetine 30 MG capsule Commonly known as:  CYMBALTA Take 30 mg by mouth daily.   Flaxseed Oil 1000 MG Caps Take 1,000 mg by mouth 3 (three) times daily.   fluticasone 50 MCG/ACT nasal spray Commonly known as:  FLONASE Place 1 spray into both nostrils daily as needed for allergies or rhinitis.   gabapentin 100 MG capsule Commonly known as:  NEURONTIN Take 1 capsule (100 mg total)  by mouth every morning. What changed:  how much to take  when to take this  additional instructions   latanoprost 0.005 % ophthalmic solution Commonly known as:  XALATAN Place 1 drop into both eyes at bedtime.   levalbuterol 45 MCG/ACT inhaler Commonly known as:  XOPENEX HFA Inhale 2 puffs into the lungs every 4 (four) hours as needed for wheezing.   loratadine 10 MG tablet Commonly known as:  CLARITIN Take 10 mg by mouth daily.   multivitamin with minerals Tabs tablet Take 1 tablet by mouth daily.   senna-docusate 8.6-50 MG tablet Commonly known as:  Senokot-S Take 1 tablet by mouth at bedtime. Available over the counter   sodium chloride 0.65 % Soln nasal spray Commonly known as:  OCEAN Place 1 spray into both nostrils daily as needed for congestion.   ticagrelor 90 MG Tabs tablet Commonly known as:  BRILINTA Take 1 tablet (90 mg total) by mouth 2 (two) times daily.   traMADol 50 MG tablet Commonly known as:   ULTRAM Take 0.5 tablets (25 mg total) by mouth 2 (two) times daily as needed (pain). What changed:  how much to take   VISINE OP Place 1 drop into both eyes 2 (two) times daily as needed (dry eyes).   Vitamin D 2000 units Caps Take 2,000 Units by mouth daily.            Discharge Care Instructions        Start     Ordered   02/07/17 0000  acetaminophen (TYLENOL) 500 MG tablet  Every 6 hours PRN    Question:  Supervising Provider  Answer:  Ranelle Oyster   02/07/17 0903   02/07/17 0000  senna-docusate (SENOKOT-S) 8.6-50 MG tablet  Daily at bedtime    Question:  Supervising Provider  Answer:  Faith Rogue T   02/07/17 0903   02/07/17 0000  traMADol (ULTRAM) 50 MG tablet  2 times daily PRN    Question:  Supervising Provider  Answer:  Faith Rogue T   02/07/17 0903   02/07/17 0000  ticagrelor (BRILINTA) 90 MG TABS tablet  2 times daily    Question:  Supervising Provider  Answer:  Ranelle Oyster   02/07/17 1545   02/02/17 0000  Ambulatory referral to Physical Medicine Rehab    Comments:  1-2 weeks transitional care appt   02/02/17 0909     Follow-up Information    Kirsteins, Victorino Sparrow, MD Follow up.   Specialty:  Physical Medicine and Rehabilitation Contact information: [address] [place] [address] [phone]        Micki Riley, MD Follow up.   Specialties:  Neurology, Radiology Contact information: [address] [place] [address] [phone]        Lynn Ito, MD Follow up on 02/12/2017.   Specialty:  Internal Medicine Why:  APPOINTMENT @ 3:00 PM Contact information: [address] [place] [address] [phone]           Signed: Jacquelynn Cree 02/07/2017, 3:57 PM

## 2017-02-07 NOTE — Patient Care Conference (Signed)
Inpatient RehabilitationTeam Conference and Plan of Care Update Date: 02/07/2017   Time: 11:20 AM    Patient Name: Carolyn Werner      Medical Record Number: [id number]  Date of Birth: [birth year] Sex: Female         Room/Bed: 4M07C/4M07C-01 Payor Info: Payor: MEDICARE / Plan: MEDICARE PART A AND B / Product Type: *No Product type* /    Admitting Diagnosis: CVA  Admit Date/Time:  01/30/2017  4:10 PM Admission Comments: No comment available   Primary Diagnosis:  Embolic stroke ([name]) Principal Problem: Embolic stroke [name])  Patient Active Problem List   Diagnosis Date Noted  . S/P AKA (above knee amputation) unilateral, right ([name])   . Irritable bowel syndrome   . Diabetic peripheral neuropathy ([name])   . Embolic stroke ([name]) 01/30/2017  . Ataxia, post-stroke   . Diabetes mellitus type 2 in nonobese ([name])   . Stage 3 chronic kidney disease   . History of right above knee amputation ([name])   . History of CVA (cerebrovascular accident)   . Benign essential HTN   . Transient cerebral ischemia   . Cerebral thrombosis with cerebral infarction 01/28/2017  . Right arm weakness 01/26/2017  . Weakness 01/26/2017  . Hyperlipemia 10/05/2016  . Weakness of both arms   . Left pontine stroke ([name]) 07/28/2016  . 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 Slurred speech 07/27/2016  . CKD (chronic kidney disease) stage 3, GFR 30-59 ml/min 07/27/2016  . Diabetes mellitus ([name]) 07/27/2016  . Hypertension 07/27/2016  . Depression 07/27/2016  . Glaucoma 07/27/2016  . Neuropathic pain 07/27/2016  . Asthma 07/27/2016  . Heart palpitations 07/05/2016  . Cardiac murmur 07/05/2016  . Phantom limb pain ([name]) 11/13/2014  . Cervical disc disorder with radiculopathy of cervical region 11/13/2014    Expected Discharge Date: Expected Discharge Date: 02/07/17  Team Members Present: Physician leading conference: Dr. Claudette Laws Social Worker Present: [name],  LCSW Nurse Present: [name], RN PT Present: [name], PT OT Present: [name], OT SLP Present: [name], SLP PPS Coordinator present : [name], RN, CRRN     Current Status/Progress Goal Weekly Team Focus  Medical   Excellent progress with PT and OT, medically remaining stable  Home with family assistance  Discharge planning   Bowel/Bladder   continent of bowel and bladder; LBM 9/3 after suppository  Mod I   Assess and treat for constipation as needed   Swallow/Nutrition/ Hydration             ADL's   Mod I/supervision overall  Mod I/supervision   Pt/family education, dc planning, home fine motor program    Mobility   supervision/mod I  mod I w/c level  d/c planning   Communication             Safety/Cognition/ Behavioral Observations            Pain   Denies pain  < 3  Assess and treat for pain q shift and prn   Skin   Skin intact  Mod I assist  Assess skin q shift and prn      *See Care Plan and progress notes for long and short-term goals.     Barriers to Discharge  Current Status/Progress Possible Resolutions Date Resolved   Physician    Other (comments)  Balance issues  Good progress towards goals at a wheelchair level  Planning discharge today      Nursing  PT                    OT                  SLP                SW                Discharge Planning/Teaching Needs:  HOme with daughter-aide and to return to adult day care next week. Daughter will be here around 1:00pm to transport home      Team Discussion:  Reached goals of mod/i wheelchair level and medically stable for discharge today. Pt did very well and is ready to go home and pleased with her progress.  Revisions to Treatment Plan:  DC 9/5    Continued Need for Acute Rehabilitation Level of Care: The patient requires daily medical management by a physician with specialized training in physical medicine and rehabilitation for the following  conditions: Daily direction of a multidisciplinary physical rehabilitation program to ensure safe treatment while eliciting the highest outcome that is of practical value to the patient.: Yes Daily medical management of patient stability for increased activity during participation in an intensive rehabilitation regime.: Yes Daily analysis of laboratory values and/or radiology reports with any subsequent need for medication adjustment of medical intervention for : Neurological problems;Other  [name], [name] 02/07/2017, 1:52 PM

## 2017-02-07 NOTE — Discharge Instructions (Signed)
Inpatient Rehab Discharge Instructions  [facility] Discharge date and time:  02/07/17  Activities/Precautions/ Functional Status: Activity: no lifting, driving, or strenuous exercise till cleared by MD Diet: cardiac diet Wound Care: none needed   Functional status:  ___ No restrictions     ___ Walk up steps independently ___ 24/7 supervision/assistance   ___ Walk up steps with assistance _X__ Intermittent supervision/assistance  ___ Bathe/dress independently ___ Walk with walker     _X__ Bathe/dress with assistance __ Walk Independently    ___ Shower independently ___ Walk with assistance    ___ Shower with assistance ___ No alcohol     ___ Return to work/school ________   Special Instructions: 1.  Drink plenty of fluids.    COMMUNITY REFERRALS UPON DISCHARGE:    Home Health:   PT & OT  Agency:[facility] [phone]   Date of last service:02/07/2017  Medical Equipment/Items Ordered:HAS ALL EQUIPMENT FROM PREVIOUS ADMITS    GENERAL COMMUNITY RESOURCES FOR PATIENT/FAMILY: Support Groups:[facility] SUPPORT GROUP EVERY SECOND Thursday @ 3:00-4:00 PM ON THE REHAB UNIT QUESTIONS [facility] [phone]   STROKE/TIA DISCHARGE INSTRUCTIONS SMOKING Cigarette smoking nearly doubles your risk of having a stroke & is the single most alterable risk factor  If you smoke or have smoked in the last 12 months, you are advised to quit smoking for your health.  Most of the excess cardiovascular risk related to smoking disappears within a year of stopping.  Ask you doctor about anti-smoking medications  [place] Quit Line: 1-800-QUIT NOW  Free Smoking Cessation Classes [phone]  CHOLESTEROL Know your levels; limit fat & cholesterol in your diet  Lipid Panel     Component Value Date/Time   CHOL 206 (H) 01/26/2017 2113   TRIG 217 (H) 01/26/2017 2113   HDL 29 (L) 01/26/2017 2113   CHOLHDL 7.1 01/26/2017 2113   VLDL 43 (H) 01/26/2017 2113   LDLCALC 134 (H) 01/26/2017  2113      Many patients benefit from treatment even if their cholesterol is at goal.  Goal: Total Cholesterol (CHOL) less than 160  Goal:  Triglycerides (TRIG) less than 150  Goal:  HDL greater than 40  Goal:  LDL (LDLCALC) less than 100   BLOOD PRESSURE American Stroke Association blood pressure target is less that 120/80 mm/Hg  Your discharge blood pressure is:  BP: 121/66  Monitor your blood pressure  Limit your salt and alcohol intake  Many individuals will require more than one medication for high blood pressure  DIABETES (A1c is a blood sugar average for last 3 months) Goal HGBA1c is under 7% (HBGA1c is blood sugar average for last 3 months)  Diabetes:     Lab Results  Component Value Date   HGBA1C 6.9 (H) 01/26/2017     Your HGBA1c can be lowered with medications, healthy diet, and exercise.  Check your blood sugar as directed by your physician  Call your physician if you experience unexplained or low blood sugars.  PHYSICAL ACTIVITY/REHABILITATION Goal is 30 minutes at least 4 days per week  Activity: No driving, Therapies: see above Return to work: N/A  Activity decreases your risk of heart attack and stroke and makes your heart stronger.  It helps control your weight and blood pressure; helps you relax and can improve your mood.  Participate in a regular exercise program.  Talk with your doctor about the best form of exercise for you (dancing, walking, swimming, cycling).  DIET/WEIGHT Goal is to maintain a healthy weight  Your discharge diet is: Diet Heart Room service appropriate? Yes; Fluid consistency: Thin liquids Your height is:  Height: 5\' 4"  (162.6 cm) Your current weight is: Weight: 58.4 kg (128 lb 11.2 oz) Your Body Mass Index (BMI) is:  BMI (Calculated): 22.08  Following the type of diet specifically designed for you will help prevent another stroke.  You are at goal weight.   Your goal Body Mass Index (BMI) is 19-24.  Healthy food habits can  help reduce 3 risk factors for stroke:  High cholesterol, hypertension, and excess weight.  RESOURCES Stroke/Support Group:  Call [phone]   STROKE EDUCATION PROVIDED/REVIEWED AND GIVEN TO PATIENT Stroke warning signs and symptoms How to activate emergency medical system (call 911). Medications prescribed at discharge. Need for follow-up after discharge. Personal risk factors for stroke. Pneumonia vaccine given:  Flu vaccine given:  My questions have been answered, the writing is legible, and I understand these instructions.  I will adhere to these goals & educational materials that have been provided to me after my discharge from the hospital.     My questions have been answered and I understand these instructions. I will adhere to these goals and the provided educational materials after my discharge from the hospital.  Patient/Caregiver Signature _______________________________ Date __________  Clinician Signature _______________________________________ Date __________  Please bring this form and your medication list with you to all your follow-up doctor's appointments.

## 2017-02-07 NOTE — Progress Notes (Signed)
Subjective/Complaints:  Discussed d/c followups  ROS: Denies CP, SOB, N/V/D  Objective: Vital Signs: Blood pressure 121/66, pulse 97, temperature 97.9 F (36.6 C), temperature source Oral, resp. rate 17, height '5\' 4"'  (1.626 m), weight 58.4 kg (128 lb 11.2 oz), SpO2 100 %. No results found. Results for orders placed or performed during the hospital encounter of 01/30/17 (from the past 72 hour(s))  Glucose, capillary     Status: Abnormal   Collection Time: 02/04/17 11:39 AM  Result Value Ref Range   Glucose-Capillary 243 (H) 65 - 99 mg/dL   Comment 1 Document in Chart   Glucose, capillary     Status: Abnormal   Collection Time: 02/04/17  4:48 PM  Result Value Ref Range   Glucose-Capillary 112 (H) 65 - 99 mg/dL  Glucose, capillary     Status: Abnormal   Collection Time: 02/04/17  8:49 PM  Result Value Ref Range   Glucose-Capillary 146 (H) 65 - 99 mg/dL  Glucose, capillary     Status: Abnormal   Collection Time: 02/05/17  6:41 AM  Result Value Ref Range   Glucose-Capillary 137 (H) 65 - 99 mg/dL  Glucose, capillary     Status: Abnormal   Collection Time: 02/05/17 11:44 AM  Result Value Ref Range   Glucose-Capillary 165 (H) 65 - 99 mg/dL  Glucose, capillary     Status: Abnormal   Collection Time: 02/05/17  4:43 PM  Result Value Ref Range   Glucose-Capillary 143 (H) 65 - 99 mg/dL  Glucose, capillary     Status: Abnormal   Collection Time: 02/05/17  8:50 PM  Result Value Ref Range   Glucose-Capillary 170 (H) 65 - 99 mg/dL  Glucose, capillary     Status: Abnormal   Collection Time: 02/06/17  6:20 AM  Result Value Ref Range   Glucose-Capillary 125 (H) 65 - 99 mg/dL  Basic metabolic panel     Status: Abnormal   Collection Time: 02/06/17  6:38 AM  Result Value Ref Range   Sodium 135 135 - 145 mmol/L   Potassium 4.2 3.5 - 5.1 mmol/L   Chloride 103 101 - 111 mmol/L   CO2 23 22 - 32 mmol/L   Glucose, Bld 133 (H) 65 - 99 mg/dL   BUN 21 (H) 6 - 20 mg/dL   Creatinine, Ser 1.47  (H) 0.44 - 1.00 mg/dL   Calcium 10.2 8.9 - 10.3 mg/dL   GFR calc non Af Amer 30 (L) >60 mL/min   GFR calc Af Amer 34 (L) >60 mL/min    Comment: (NOTE) The eGFR has been calculated using the CKD EPI equation. This calculation has not been validated in all clinical situations. eGFR's persistently <60 mL/min signify possible Chronic Kidney Disease.    Anion gap 9 5 - 15  CBC     Status: None   Collection Time: 02/06/17  6:38 AM  Result Value Ref Range   WBC 6.3 4.0 - 10.5 K/uL   RBC 4.63 3.87 - 5.11 MIL/uL   Hemoglobin 13.7 12.0 - 15.0 g/dL   HCT 41.3 36.0 - 46.0 %   MCV 89.2 78.0 - 100.0 fL   MCH 29.6 26.0 - 34.0 pg   MCHC 33.2 30.0 - 36.0 g/dL   RDW 12.4 11.5 - 15.5 %   Platelets 158 150 - 400 K/uL  Glucose, capillary     Status: Abnormal   Collection Time: 02/06/17 11:35 AM  Result Value Ref Range   Glucose-Capillary 239 (H) 65 - 99 mg/dL  Glucose, capillary     Status: Abnormal   Collection Time: 02/06/17  5:05 PM  Result Value Ref Range   Glucose-Capillary 106 (H) 65 - 99 mg/dL  Glucose, capillary     Status: Abnormal   Collection Time: 02/06/17  9:03 PM  Result Value Ref Range   Glucose-Capillary 174 (H) 65 - 99 mg/dL  Glucose, capillary     Status: Abnormal   Collection Time: 02/07/17  6:37 AM  Result Value Ref Range   Glucose-Capillary 140 (H) 65 - 99 mg/dL     HEENT: Normocephalic, atraumatic Cardio: RRR and no JVD Resp: CTA B/L and Unlabored GI: BS positive and ND Skin:   Healed R AKA.  Neuro: Alert and oriented  Motor 4+/5 in R delt, bi, tri, grip, 5/5 LUE and LLE, 4+/5 R HF  No ataxia HOH Musc/Skel:  R AKA. No edema, no tenderness Gen: NAD. Vital signs reviewed  Assessment/Plan: 1. Functional deficits secondary to Left pontine infarct Stable for D/C today F/u PCP in 3-4 weeks F/u PM&R 2 weeks See D/C summary See D/C instructions FIM: Function - Bathing Position: Wheelchair/chair at sink Body parts bathed by patient: Right arm, Left arm, Abdomen,  Chest, Buttocks, Front perineal area, Right upper leg, Left upper leg, Left lower leg Body parts bathed by helper: Back Bathing not applicable: Right lower leg Assist Level: Assistive device, Supervision or verbal cues Assistive Device Comment: long-handled sponge  Function- Upper Body Dressing/Undressing What is the patient wearing?: Bra, Button up shirt Bra - Perfomed by patient: Thread/unthread right bra strap, Thread/unthread left bra strap, Hook/unhook bra (pull down sports bra) Bra - Perfomed by helper: Hook/unhook bra (pull down sports bra) Pull over shirt/dress - Perfomed by patient: Thread/unthread right sleeve, Put head through opening, Pull shirt over trunk, Thread/unthread left sleeve Button up shirt - Perfomed by patient: Thread/unthread right sleeve, Thread/unthread left sleeve, Pull shirt around back, Button/unbutton shirt Assist Level: More than reasonable time Set up : To obtain clothing/put away Function - Lower Body Dressing/Undressing What is the patient wearing?: Underwear, Pants, Non-skid slipper socks Position: Wheelchair/chair at sink Underwear - Performed by patient: Thread/unthread right underwear leg, Thread/unthread left underwear leg, Pull underwear up/down Underwear - Performed by helper: Pull underwear up/down Pants- Performed by patient: Thread/unthread right pants leg, Thread/unthread left pants leg, Pull pants up/down Pants- Performed by helper: Pull pants up/down Non-skid slipper socks- Performed by patient: Don/doff left sock (R AKA) Assist for footwear: Independent Assist for lower body dressing: Assistive device Assistive Device Comment: sock-aid  Function - Toileting Toileting steps completed by patient: Performs perineal hygiene, Adjust clothing prior to toileting Toileting steps completed by helper: Adjust clothing after toileting Toileting Assistive Devices: Grab bar or rail Assist level: Touching or steadying assistance (Pt.75%)  Function -  Air cabin crew transfer assistive device: Grab bar, Elevated toilet seat/BSC over toilet Assist level to toilet: Touching or steadying assistance (Pt > 75%) Assist level from toilet: Touching or steadying assistance (Pt > 75%)  Function - Chair/bed transfer Chair/bed transfer method: Stand pivot Chair/bed transfer assist level: No Help, no cues, assistive device, takes more than a reasonable amount of time Chair/bed transfer assistive device: Armrests  Function - Locomotion: Wheelchair Will patient use wheelchair at discharge?: Yes Type: Manual Max wheelchair distance: 150' Assist Level: No help, No cues, assistive device, takes more than reasonable amount of time Assist Level: No help, No cues, assistive device, takes more than reasonable amount of time Assist Level: No help, No cues, assistive  device, takes more than reasonable amount of time Turns around,maneuvers to table,bed, and toilet,negotiates 3% grade,maneuvers on rugs and over doorsills: Yes Function - Locomotion: Ambulation Ambulation activity did not occur: Safety/medical concerns Assistive device: Walker-rolling Max distance: 5' Assist level: Touching or steadying assistance (Pt > 75%) Walk 10 feet activity did not occur: Safety/medical concerns Walk 50 feet with 2 turns activity did not occur: Safety/medical concerns Walk 150 feet activity did not occur: Safety/medical concerns Walk 10 feet on uneven surfaces activity did not occur: Safety/medical concerns  Function - Comprehension Comprehension: Auditory Comprehension assistive device: Hearing aids Comprehension assist level: Follows complex conversation/direction with extra time/assistive device  Function - Expression Expression: Verbal Expression assist level: Expresses complex ideas: With extra time/assistive device  Function - Social Interaction Social Interaction assist level: Interacts appropriately with others with medication or extra time  (anti-anxiety, antidepressant).  Function - Problem Solving Problem solving assist level: Solves complex problems: With extra time  Function - Memory Memory assist level: Recognizes or recalls 90% of the time/requires cueing < 10% of the time Patient normally able to recall (first 3 days only): Current season, Location of own room, Staff names and faces, That he or she is in a hospital  Medical Problem List and Plan: 1. Right hemiparesissecondary to left pontine infarct. Hx of right AKA -Cont CIR PT, OT d/c today -Not a prosthetic user AKA 2013    2. DVT Prophylaxis/Anticoagulation: Pharmaceutical: Lovenox 3. Chronic HA/back pain/Pain Management: cymbalta--unable to tolerate higher doses.  4. Mood: LCSW to follow for evaluation and support.  5. Neuropsych: This patient is capable of making decisions on herown behalf. 6. Skin/Wound Care: routine pressure relief measures. Maintain adequate nutritional and hydration status.  7. Fluids/Electrolytes/Nutrition: Monitor I/Os 8. T2DM: Monitor BS ac/hs. CBG (last 3)   Recent Labs  02/06/17 1705 02/06/17 2103 02/07/17 0637  GLUCAP 106* 174* 140*    controlled on 9/5 9. HTN: Monitor BP bid--goal 130-160 to avoid hypoperfusion due to severe ICA stenosis.  No BP meds, started IVF qhs Vitals:   02/06/17 1355 02/07/17 0315  BP: 133/70 121/66  Pulse: 89 97  Resp: 18 17  Temp: (!) 97.4 F (36.3 C) 97.9 F (36.6 C)  SpO2: 100% 100%   Minimal elevation this AM, otherwise, relatively controlled on 9/5 10. CKD: Monitor intake-- avoid nephrotoxic medications.    Creat Stable 1.47 9/4  Cont to monitor 11. Peripheral neuropathy/phantom pain: On gabapentin and duloxetine.  -pt does not wish to change dose 12. IBS-Chronic constipation: Change miralax (causing bloating/diarrhea) to Senna S.   LOS (Days) 8 A FACE TO FACE EVALUATION WAS PERFORMED  Simon Aaberg E 02/07/2017, 7:43 AM

## 2017-02-08 ENCOUNTER — Telehealth: Payer: Self-pay

## 2017-02-08 NOTE — Telephone Encounter (Signed)
Transitional Care call  Patient name: Carolyn Werner(Carolyn Werner ) DOB: (08/13/1921) 1. Are you/is patient experiencing any problems since coming home? (NO) a. Are there any questions regarding any aspect of care? (NO) 2. Are there any questions regarding medications administration/dosing? (NO) a. Are meds being taken as prescribed? (YES) b. "Patient should review meds with caller to confirm"  3. Have there been any falls? (NO) 4. Has Home Health been to the house and/or have they contacted you? (YES) a. If not, have you tried to contact them? (NA) b. Can we help you contact them? (NA) 5. Are bowels and bladder emptying properly? (YES) a. Are there any unexpected incontinence issues? (NO) b. If applicable, is patient following bowel/bladder programs? (NA) 6. Any fevers, problems with breathing, unexpected pain? (NO) 7. Are there any skin problems or new areas of breakdown? (NO) 8. Has the patient/family member arranged specialty MD follow up (ie cardiology/neurology/renal/surgical/etc.)?  (YES) a. Can we help arrange? (NO) 9. Does the patient need any other services or support that we can help arrange? (NO) 10. Are caregivers following through as expected in assisting the patient? (YES) 11. Has the patient quit smoking, drinking alcohol, or using drugs as recommended? (NA)  Appointment date/time (02-20-2017 / 1100AM), arrive time (1030AM) and who it is with here (DR, Wynn BankerKIRSTEINS) [address] [phone]

## 2017-02-13 ENCOUNTER — Telehealth: Payer: Self-pay | Admitting: Neurology

## 2017-02-13 NOTE — Telephone Encounter (Signed)
Dr Lawson FiscalLori Smith/[facility] [phone] called said the pt was d/c from the hospital on 8/28 for stroke. Pt was started on ticagrelor (BRILINTA) 90 MG TABS tablet at the hospital. Pt's daughter picked up RX at pharmacy bc she did not want her to be without the medication but it cost $170. This is not affordable. Pt is allergic to plavix and failed with agranaux . Dr Katrinka BlazingSmith wants to know what an alternative medication is going to be. She is asking for a call back today to discuss.

## 2017-02-13 NOTE — Telephone Encounter (Signed)
Dual antiplatelet therapies ideal but if she is allergic to Plavix and cannot afford Brilinta she may stay on aspirin 325 mg daily alone.

## 2017-02-14 NOTE — Telephone Encounter (Signed)
LEft vm for MRs.SMith at [phone] to call back with Dr .Pearlean BrownieSethi recommendations.

## 2017-02-14 NOTE — Telephone Encounter (Signed)
Left 2nd vm on Carolyn Werner number at [phone] to call back about her unable to afford brilinta.Rn was calling to get recommendations per Dr .Roda ShuttersXu.

## 2017-02-15 NOTE — Telephone Encounter (Signed)
Reva/Dr Katrinka BlazingSmith office returned RN's call. Please call her at [phone]

## 2017-02-15 NOTE — Telephone Encounter (Signed)
Rn return Reva call with Dr.Smith office. Rn stated Dr. Pearlean BrownieSethi reviewed the chart, and recommend aspirin 325, if she cant afford the brilinta.Also if she has allergy to plavix, and failed aggrenox. Reva will notify Dr. Katrinka BlazingSmith and verbalized understanding.

## 2017-02-20 ENCOUNTER — Encounter: Payer: Medicare Other | Admitting: Physical Medicine & Rehabilitation

## 2017-02-22 ENCOUNTER — Ambulatory Visit (HOSPITAL_BASED_OUTPATIENT_CLINIC_OR_DEPARTMENT_OTHER): Payer: Medicare Other | Admitting: Physical Medicine & Rehabilitation

## 2017-02-22 ENCOUNTER — Encounter: Payer: Self-pay | Admitting: Physical Medicine & Rehabilitation

## 2017-02-22 ENCOUNTER — Encounter: Payer: Medicare Other | Attending: Physical Medicine & Rehabilitation

## 2017-02-22 VITALS — BP 146/88 | HR 81 | Resp 15

## 2017-02-22 DIAGNOSIS — I69398 Other sequelae of cerebral infarction: Secondary | ICD-10-CM

## 2017-02-22 DIAGNOSIS — M5030 Other cervical disc degeneration, unspecified cervical region: Secondary | ICD-10-CM | POA: Diagnosis not present

## 2017-02-22 DIAGNOSIS — I5032 Chronic diastolic (congestive) heart failure: Secondary | ICD-10-CM | POA: Insufficient documentation

## 2017-02-22 DIAGNOSIS — I69351 Hemiplegia and hemiparesis following cerebral infarction affecting right dominant side: Secondary | ICD-10-CM

## 2017-02-22 DIAGNOSIS — I110 Hypertensive heart disease with heart failure: Secondary | ICD-10-CM | POA: Insufficient documentation

## 2017-02-22 DIAGNOSIS — I739 Peripheral vascular disease, unspecified: Secondary | ICD-10-CM | POA: Insufficient documentation

## 2017-02-22 DIAGNOSIS — E119 Type 2 diabetes mellitus without complications: Secondary | ICD-10-CM | POA: Insufficient documentation

## 2017-02-22 DIAGNOSIS — R269 Unspecified abnormalities of gait and mobility: Secondary | ICD-10-CM

## 2017-02-22 DIAGNOSIS — J4530 Mild persistent asthma, uncomplicated: Secondary | ICD-10-CM | POA: Diagnosis not present

## 2017-02-22 DIAGNOSIS — K589 Irritable bowel syndrome without diarrhea: Secondary | ICD-10-CM | POA: Diagnosis not present

## 2017-02-22 DIAGNOSIS — Z87891 Personal history of nicotine dependence: Secondary | ICD-10-CM | POA: Diagnosis not present

## 2017-02-22 DIAGNOSIS — Z89611 Acquired absence of right leg above knee: Secondary | ICD-10-CM | POA: Insufficient documentation

## 2017-02-22 DIAGNOSIS — Z86018 Personal history of other benign neoplasm: Secondary | ICD-10-CM | POA: Diagnosis not present

## 2017-02-22 NOTE — Progress Notes (Signed)
Subjective:    Patient ID: Carolyn Werner, female    DOB: 01-29-1922, 83 y.o.   MRN: [id number] Carolyn Werner is a 83 y.o.RH-female with history of  T2DM with peripheral neuropathy, CKD-baseline SCr-1.3, R-AKA- wheelchair bound, asthma, chronic back pain/HA, bilateral cerebellar/pontine strokes 07/2016 due to high grade L-ICA stenosis; who was admitted on 01/26/17 with increasing right sided weakness and slurred speech when getting up from sleep. 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 Patient with improving symptoms post admission. CTA  Head/neck recommended but family declined.  2D echo with EF 60-65% with grade one diastolic dysfunction, calcified mitral annulus and lipomatous atrial septal hypertrophy. Dr. Pearlean Brownie recommended ASA and Brilinta for stroke prophylaxis and BP goal 130-160 due to severe vascular stenosis.    Admit date: 01/30/2017 Discharge date: 02/07/2017  HPI  [facility] 4d per week 1 day a week with Home health Transfers Mod I to toilet Daughter adjusts work schedule to be with pt Never at home alone for more than 3 hours.  Goals of writingWith right hand. Has had follow-up with primary care. Has appointment set up with neurology October 11, 3 PM, Dr. Pearlean Brownie Has appointment with cardiology November 1, [place]  Discussed patient functional status with daughter. The patient lives with her daughter. Patient has been using a wheelchair since 2013  Pain Inventory Average Pain 4 Pain Right Now 0 My pain is intermittent, sharp, burning and stinging  In the last 24 hours, has pain interfered with the following? General activity 4 Relation with others 5 Enjoyment of life 5 What TIME of day is your pain at its worst? sporadic, varies Sleep (in general) Good  Pain is worse with:  dampness Pain improves with: rest, heat/ice and medication Relief from Meds: 6  Mobility ability to climb steps?  no do you drive?  no use a wheelchair Do you have any goals in this area?  no  Function retired I need assistance with the following:  feeding, dressing, bathing, toileting, meal prep, household duties and shopping  Neuro/Psych tingling trouble walking spasms confusion depression anxiety  Prior Studies hospital f/u  Physicians involved in your care hospital f/u   Family History  Problem Relation Age of Onset  . Dementia Unknown   . Hypertension Daughter    Social History   Social History  . Marital status: Widowed    Spouse name: N/A  . Number of children: 3  . Years of education: N/A   Social History Main Topics  . Smoking status: Former Smoker    Packs/day: 0.25    Years: 23.00    Quit date: 06/05/1973  . Smokeless tobacco: Never Used  . Alcohol use No  . Drug use: No  . Sexual activity: No   Other Topics Concern  . None   Social History Narrative   Lives at home w/ her daughter   Right-handed   Caffeine: decaf coffee   Past Surgical History:  Procedure Laterality Date  . ABOVE KNEE LEG AMPUTATION Right 2013  . APPENDECTOMY    . BYPASS GRAFT     ileofemoral   . PITUITARY EXCISION    . TONSILLECTOMY     Past Medical History:  Diagnosis Date  . DDD (degenerative disc disease), cervical   . Diabetes mellitus without complication ([name])   . Headache   . Hx of benign neoplasm  of pituitary gland   . IBS (irritable colon syndrome)-C   . Mild persistent asthma   . PVD (peripheral vascular disease) ([name])   . Shingles   . Stroke ([name])    BP (!) 146/88 (BP Location: Right Arm, Patient Position: Sitting, Cuff Size: Normal)   Pulse 81   Resp 15   SpO2 98%   Opioid Risk Score:   Fall Risk Score:  `1  Depression screen PHQ 2/9  No flowsheet data found.  Review of Systems  Constitutional: Positive for appetite change and  diaphoresis.  HENT: Negative.   Eyes: Negative.   Respiratory: Positive for cough and shortness of breath.   Cardiovascular: Negative.   Gastrointestinal: Positive for constipation.  Endocrine:       High blood sugar   Musculoskeletal: Positive for gait problem.       Spasms  Neurological:       Tingling  Psychiatric/Behavioral: Positive for confusion and dysphoric mood. The patient is nervous/anxious.        Objective:   Physical Exam  Constitutional: She appears well-developed and well-nourished.  HENT:  Head: Normocephalic and atraumatic.  Eyes: Pupils are equal, round, and reactive to light. Conjunctivae are normal.  Cardiovascular: Normal rate, regular rhythm and normal heart sounds.   No murmur heard. Pulmonary/Chest: Effort normal and breath sounds normal. No respiratory distress.  Abdominal: Soft. Bowel sounds are normal. She exhibits distension. There is no tenderness.  Neurological: She is alert.  Psychiatric: She has a normal mood and affect. Her behavior is normal. Thought content normal.  Nursing note and vitals reviewed. Motor strength is 5/5 left deltoid, bicep, tricep, grip, 4 plus in the right deltoid, biceps, triceps, grip  5/5 and left hip flexor, knee extensor, ankle dorsal flexor. 4/5 right hip flexor  Musculoskeletal exam, well-healed. Right AKA, nontender. No swelling  No range of motion limitations in the shoulders.      Assessment & Plan:  1. Left paramedian pontine infarct with residual mild right upper limb weakness. Lower extremity strength is difficult to assess secondary to right AKA She has fine motor deficits in the right upper extremity with goals of writing. She still has balance deficits on transfers. Walking is likely not a practical goal. Even though in the hospital she was able to ambulate short distances with the therapist using a walker. She would benefit from home health PT, OT She will continue with adult daycare, Do not  anticipate need for outpatient therapy  2. History of hypertension, per neurology recommended. Systolic blood pressure is 130-140mmHg Per daughter. PCP is aware  3. History of cardiac dysrhythmia as well as diastolic congestive heart failure, follow-up with cardiology  . 4. Secondary stroke prophylaxis neurology recommending Brillinta 90 mg twice a day plus aspirin 81 mg per day  Physical medicine and rehabilitation follow-up on as-needed basis

## 2017-02-22 NOTE — Patient Instructions (Signed)
No outpatient rehab recommended

## 2017-03-14 ENCOUNTER — Telehealth: Payer: Self-pay | Admitting: Neurology

## 2017-03-15 ENCOUNTER — Ambulatory Visit: Payer: Medicare Other | Admitting: Neurology

## 2017-03-21 ENCOUNTER — Emergency Department (HOSPITAL_COMMUNITY): Payer: Medicare Other

## 2017-03-21 ENCOUNTER — Encounter (HOSPITAL_COMMUNITY): Payer: Self-pay | Admitting: Emergency Medicine

## 2017-03-21 ENCOUNTER — Emergency Department (HOSPITAL_COMMUNITY)
Admission: EM | Admit: 2017-03-21 | Discharge: 2017-03-21 | Disposition: A | Discharge status: Home / Self Care | Payer: Medicare Other | Attending: Physician Assistant | Admitting: Physician Assistant

## 2017-03-21 DIAGNOSIS — Z79899 Other long term (current) drug therapy: Secondary | ICD-10-CM | POA: Insufficient documentation

## 2017-03-21 DIAGNOSIS — I6523 Occlusion and stenosis of bilateral carotid arteries: Secondary | ICD-10-CM | POA: Insufficient documentation

## 2017-03-21 DIAGNOSIS — N183 Chronic kidney disease, stage 3 (moderate): Secondary | ICD-10-CM | POA: Diagnosis not present

## 2017-03-21 DIAGNOSIS — Z7982 Long term (current) use of aspirin: Secondary | ICD-10-CM | POA: Insufficient documentation

## 2017-03-21 DIAGNOSIS — Z89611 Acquired absence of right leg above knee: Secondary | ICD-10-CM | POA: Insufficient documentation

## 2017-03-21 DIAGNOSIS — E1151 Type 2 diabetes mellitus with diabetic peripheral angiopathy without gangrene: Secondary | ICD-10-CM | POA: Insufficient documentation

## 2017-03-21 DIAGNOSIS — I6529 Occlusion and stenosis of unspecified carotid artery: Secondary | ICD-10-CM

## 2017-03-21 DIAGNOSIS — E1140 Type 2 diabetes mellitus with diabetic neuropathy, unspecified: Secondary | ICD-10-CM | POA: Diagnosis not present

## 2017-03-21 DIAGNOSIS — I129 Hypertensive chronic kidney disease with stage 1 through stage 4 chronic kidney disease, or unspecified chronic kidney disease: Secondary | ICD-10-CM | POA: Diagnosis not present

## 2017-03-21 DIAGNOSIS — I10 Essential (primary) hypertension: Secondary | ICD-10-CM | POA: Diagnosis not present

## 2017-03-21 DIAGNOSIS — J4530 Mild persistent asthma, uncomplicated: Secondary | ICD-10-CM | POA: Diagnosis not present

## 2017-03-21 DIAGNOSIS — Z87891 Personal history of nicotine dependence: Secondary | ICD-10-CM | POA: Diagnosis not present

## 2017-03-21 DIAGNOSIS — R55 Syncope and collapse: Secondary | ICD-10-CM | POA: Diagnosis present

## 2017-03-21 DIAGNOSIS — E1122 Type 2 diabetes mellitus with diabetic chronic kidney disease: Secondary | ICD-10-CM | POA: Diagnosis not present

## 2017-03-21 LAB — CBC WITH DIFFERENTIAL/PLATELET
BASOS ABS: 0 10*3/uL (ref 0.0–0.1)
BASOS PCT: 0 %
EOS ABS: 0.1 10*3/uL (ref 0.0–0.7)
EOS PCT: 1 %
HCT: 38.1 % (ref 36.0–46.0)
Hemoglobin: 12.9 g/dL (ref 12.0–15.0)
Lymphocytes Relative: 18 %
Lymphs Abs: 1.4 10*3/uL (ref 0.7–4.0)
MCH: 29.8 pg (ref 26.0–34.0)
MCHC: 33.9 g/dL (ref 30.0–36.0)
MCV: 88 fL (ref 78.0–100.0)
MONO ABS: 0.6 10*3/uL (ref 0.1–1.0)
Monocytes Relative: 8 %
Neutro Abs: 5.7 10*3/uL (ref 1.7–7.7)
Neutrophils Relative %: 73 %
PLATELETS: 177 10*3/uL (ref 150–400)
RBC: 4.33 MIL/uL (ref 3.87–5.11)
RDW: 11.9 % (ref 11.5–15.5)
WBC: 7.9 10*3/uL (ref 4.0–10.5)

## 2017-03-21 LAB — COMPREHENSIVE METABOLIC PANEL
ALBUMIN: 3.2 g/dL — AB (ref 3.5–5.0)
ALT: 17 U/L (ref 14–54)
AST: 25 U/L (ref 15–41)
Alkaline Phosphatase: 88 U/L (ref 38–126)
Anion gap: 8 (ref 5–15)
BUN: 13 mg/dL (ref 6–20)
CHLORIDE: 104 mmol/L (ref 101–111)
CO2: 21 mmol/L — AB (ref 22–32)
Calcium: 9.8 mg/dL (ref 8.9–10.3)
Creatinine, Ser: 1.15 mg/dL — ABNORMAL HIGH (ref 0.44–1.00)
GFR calc Af Amer: 46 mL/min — ABNORMAL LOW (ref >60)
GFR, EST NON AFRICAN AMERICAN: 40 mL/min — AB (ref >60)
GLUCOSE: 216 mg/dL — AB (ref 65–99)
POTASSIUM: 3.6 mmol/L (ref 3.5–5.1)
SODIUM: 133 mmol/L — AB (ref 135–145)
Total Bilirubin: 0.6 mg/dL (ref 0.3–1.2)
Total Protein: 6.7 g/dL (ref 6.5–8.1)

## 2017-03-21 LAB — URINALYSIS, ROUTINE W REFLEX MICROSCOPIC
Bilirubin Urine: NEGATIVE
Glucose, UA: NEGATIVE mg/dL
Hgb urine dipstick: NEGATIVE
KETONES UR: NEGATIVE mg/dL
LEUKOCYTES UA: NEGATIVE
NITRITE: NEGATIVE
PH: 6 (ref 5.0–8.0)
Protein, ur: NEGATIVE mg/dL
Specific Gravity, Urine: 1.011 (ref 1.005–1.030)

## 2017-03-21 LAB — I-STAT TROPONIN, ED: TROPONIN I, POC: 0 ng/mL (ref 0.00–0.08)

## 2017-03-21 MED ORDER — LORAZEPAM 2 MG/ML IJ SOLN: 0.5000 mg | Freq: Once | INTRAMUSCULAR | Status: DC | PRN

## 2017-03-21 NOTE — ED Triage Notes (Signed)
Pt arrives via [facility] from home, ems reports that pts daughter heard her scream from the other room, upon entering, pt was on the floor and was initially unable to respond to her. Pt came to and pts daughter stated that pts speech was slurred. Pt reports that she feels like she passed out. EMS stroke screen was negative, pt a/ox4, resp e/u, speech clear, no facial droop. No neuro deficits noted.

## 2017-03-21 NOTE — ED Notes (Signed)
Patient transported to CT 

## 2017-03-21 NOTE — Discharge Instructions (Addendum)
Please be mindful of fall risk. Please followup with Neurology and primary doctor as scheduled

## 2017-03-21 NOTE — H&P (Signed)
Medical Consultation   Carolyn Werner  ZOX:[id number]  DOB: 04/16/1922  DOA: 03/21/2017  PCP: Carolyn Werner, Lori, MD   Outpatient Specialists:  Podiatry, neurology  Requesting physician: Carolyn Werner - ED PA  Reason for consultation: Syncope and possible stroke   History of Present Illness: Carolyn Werner is an 83 y.o. female degenerative disc disease, diabetes, headaches, IBS, mild persistent asthma, PVD, CVA.  Patient was single syncopal episode prompting evaluation in the emergency room. Patient was in her normal state of health until episode on the morning of evaluation. Patient is wheelchair bound at baseline and reports having her breakfast and then going over to the first put the milk away. While she was reaching into the fridgeshe passed out. Patient's next recollection was waking up to her daughter talking to her. No loss of bowel or bladder function, seizure type activity, tongue biting, confusional state after syncopal episode. Patient denies any worsening of her previously known focal neurological deficits. Patient also denies any palpitations, lightheadedness, vertigo, neck stiffness, headache, fever, cough, chest pain, dysuria, frequency, abdominal pain, flank pain.  Patients previous strokes typically came with symptoms of general ill feeling, focal neurological deficit probably on the right. Patient with known significant carotid artery stenosis with 90% plus occlusion on the left.      Review of Systems:  ROS As per HPI otherwise all other systems reviewed and are negative   Past Medical History: Past Medical History:  Diagnosis Date  . DDD (degenerative disc disease), cervical   . Diabetes mellitus without complication ([name])   . Headache   . Hx of benign neoplasm of pituitary gland   . IBS (irritable colon syndrome)-C   . Mild persistent asthma   . PVD (peripheral vascular disease) ([name])   . Shingles   . Stroke [facility]([name])     Past Surgical  History: Past Surgical History:  Procedure Laterality Date  . ABOVE KNEE LEG AMPUTATION Right 2013  . APPENDECTOMY    . BYPASS GRAFT     ileofemoral   . PITUITARY EXCISION    . TONSILLECTOMY       Allergies:   Allergies  Allergen Reactions  . Atacand Hct [Candesartan Cilexetil-Hctz] Other (See Comments)    Kidney disease  . Carbamazepine Other (See Comments)    Changes in ability to read, think clearly, unsteady gait  . Erythromycin Diarrhea  . Glucophage [Metformin Hcl] Other (See Comments)    Chronic low blood sugar   . Lactose Intolerance (Gi) Diarrhea and Nausea And Vomiting    Nausea & Vomiting; Diarrhea  . Lipitor [Atorvastatin] Other (See Comments)    Muscle weakness  . Nsaids Nausea And Vomiting    Motrin, Naprosyn, Voltaren, Celebrex, Prevacid, Mobic -- abdominal pain and burning, cramps, loose stool  . Penicillins Shortness Of Breath and Rash    Has patient had a PCN reaction causing immediate rash, facial/tongue/throat swelling, SOB or lightheadedness with hypotension: Yes Has patient had a PCN reaction causing severe rash involving mucus membranes or skin necrosis: No Has patient had a PCN reaction that required hospitalization: No Has patient had a PCN reaction occurring within the last 10 years: No If all of the above answers are "NO", then may proceed with Cephalosporin use.  . Sulfa Antibiotics Anaphylaxis  . Ceclor [Cefaclor] Hives  . Clindamycin/Lincomycin Other (See Comments)    Abdominal pain, loss of appetite  . Milk-Related Compounds Other (See Comments)  Bloating, abdominal discomfort.  . Rosuvastatin Calcium Other (See Comments)    Lethargy, malaise  . Dilaudid [Hydromorphone Hcl] Rash  . Plavix [Clopidogrel] Rash and Other (See Comments)    Skin peeled, hair fell out     Social History:  reports that she quit smoking about 43 years ago. She has a 5.75 pack-year smoking history. She has never used smokeless tobacco. She reports that she  does not drink alcohol or use drugs.   Family History: Family History  Problem Relation Age of Onset  . Dementia Unknown   . Hypertension Daughter      Physical Exam: Vitals:   03/21/17 1345 03/21/17 1400 03/21/17 1415 03/21/17 1445  BP: 104/67 112/75 109/64 (!) 173/91  Pulse: 85   81  Resp: 15 12 17  (!) 21  Temp:      TempSrc:      SpO2: 100%   100%  Weight:      Height:        General: elderly appearing, no acute distress, resting in bed comfortably. Eyes:  PERRL, EOMI, normal lids, iris ENT:  grossly normal hearing, lips & tongue, mmm Neck:  no LAD, masses or thyromegaly Cardiovascular:  RRR, no m/r/g. No LE edema. Minimal bruit heard on left over the carotid artery with no bruit heard on right. Respiratory:  CTA bilaterally, no w/r/r. Normal respiratory effort. Abdomen:  soft, ntnd, NABS Skin:  no rash or induration seen on limited exam Musculoskeletal: right BKA appreciated.  grossly normal tone, good ROM, no bony abnormality Psychiatric:  grossly normal mood and affect, speech fluent and appropriate, AOx3 Neurologic: slightly diminished grip on right. CN 2-12 grossly intact, moves all remaining extremities in coordinated fashion, sensation intact  Data reviewed:  I have personally reviewed following labs and imaging studies Labs:  CBC:  Recent Labs Lab 03/21/17 1336  WBC 7.9  NEUTROABS 5.7  HGB 12.9  HCT 38.1  MCV 88.0  PLT 177    Basic Metabolic Panel:  Recent Labs Lab 03/21/17 1336  NA 133*  K 3.6  CL 104  CO2 21*  GLUCOSE 216*  BUN 13  CREATININE 1.15*  CALCIUM 9.8   GFR Estimated Creatinine Clearance: 26.4 mL/min (A) (by C-G formula based on SCr of 1.15 mg/dL (H)). Liver Function Tests:  Recent Labs Lab 03/21/17 1336  AST 25  ALT 17  ALKPHOS 88  BILITOT 0.6  PROT 6.7  ALBUMIN 3.2*   No results for input(s): LIPASE, AMYLASE in the last 168 hours. No results for input(s): AMMONIA in the last 168 hours. Coagulation profile No  results for input(s): INR, PROTIME in the last 168 hours.  Cardiac Enzymes: No results for input(s): CKTOTAL, CKMB, CKMBINDEX, TROPONINI in the last 168 hours. BNP: Invalid input(s): POCBNP CBG: No results for input(s): GLUCAP in the last 168 hours. D-Dimer No results for input(s): DDIMER in the last 72 hours. Hgb A1c No results for input(s): HGBA1C in the last 72 hours. Lipid Profile No results for input(s): CHOL, HDL, LDLCALC, TRIG, CHOLHDL, LDLDIRECT in the last 72 hours. Thyroid function studies No results for input(s): TSH, T4TOTAL, T3FREE, THYROIDAB in the last 72 hours.  Invalid input(s): FREET3 Anemia work up No results for input(s): VITAMINB12, FOLATE, FERRITIN, TIBC, IRON, RETICCTPCT in the last 72 hours. Urinalysis    Component Value Date/Time   COLORURINE YELLOW 07/27/2016 1210   APPEARANCEUR CLEAR 07/27/2016 1210   LABSPEC 1.010 07/27/2016 1210   PHURINE 6.0 07/27/2016 1210   GLUCOSEU NEGATIVE 07/27/2016 1210  HGBUR NEGATIVE 07/27/2016 1210   BILIRUBINUR NEGATIVE 07/27/2016 1210   KETONESUR NEGATIVE 07/27/2016 1210   PROTEINUR NEGATIVE 07/27/2016 1210   NITRITE NEGATIVE 07/27/2016 1210   LEUKOCYTESUR SMALL (A) 07/27/2016 1210     Microbiology No results found for this or any previous visit (from the past 240 hour(s)).     Inpatient Medications:   Scheduled Meds: Continuous Infusions:   Radiological Exams on Admission: Ct Head Wo Contrast  Result Date: 03/21/2017 CLINICAL DATA:  Slurred speech.  Altered level of consciousness. EXAM: CT HEAD WITHOUT CONTRAST TECHNIQUE: Contiguous axial images were obtained from the base of the skull through the vertex without intravenous contrast. COMPARISON:  MRI 01/27/2017 FINDINGS: Brain: Mild atrophy. Negative for hydrocephalus. Mild chronic white matter changes. Chronic infarct left occipital lobe. Negative for acute infarct, hemorrhage, or mass lesion. Vascular: Intracranial atherosclerotic calcification. Negative  for hyperdense vessel Skull: Negative Sinuses/Orbits: Chronic sinusitis with diffuse mucoperiosteal thickening in the maxillary sphenoid and ethmoid sinuses. Bilateral cataract removal. Other: None IMPRESSION: No acute intracranial abnormality Chronic sinusitis Electronically Signed   By: Marlan Palau M.D.   On: 03/21/2017 14:01    Impression/Recommendations Active Problems:   Benign essential HTN   Syncope, vasovagal   Carotid stenosis  Syncope: unclear etiology at this time but may have been from occlusive episode w/ prolonged craining of the neck given h/o 80-99% occlusion of the left internal carotid with 1-39% occlusion of the right internal carotid with numerous other areas of very stenosis throughout her neck and head. Patient without any prodrome or deficits appreciated after episode. Doubt seizure, arrhythmia, infectious process, metabolic derangement, or even further stroke. Patient recently stopped another one of her blood pressure medications in order to maintain a systolic pressure closer 160 which seems appropriate for this patient given her need for increased perfusion. Of note sometimes immediately postprandial pressure can drop. Current blood pressure readings (150-170 systolic) appear to be appropriate with some Barrett's/inaccurate readings recorded in the chart with a systolic pressure 109.of note orthostatic vital signs were normal - MRI to rule out recurrent stroke  - If MRI is positive for stroke patient will need to be admitted, neurology consultation, and further vascular workup undertaken. - patient should continue her current medical regimen including her aspirin and brought into which was started after her previous stroke.   Thank you for this consultation.  Our [facility] hospitalist team will follow the patient with you.   Time Spent: >45 min in direct pt care and counseling    Ozella Rocks M.D. [facility] 03/21/2017, 4:30 PM

## 2017-03-21 NOTE — ED Notes (Signed)
During orthostatic vitals, pt stated that she felt "kind of dizzy". Pt stated when she sat up and stood up, she "felt fine". Informed Dr. Corlis LeakMacKuen.

## 2017-03-21 NOTE — ED Notes (Addendum)
Pt stated that she had to defecate. Pt placed on bedside commode,prior to the EKG and vitals.

## 2017-03-21 NOTE — ED Notes (Signed)
Pt's urine and feces mixed together. Unable to get a urine sample at this time. Pt will try later.

## 2017-03-21 NOTE — ED Notes (Signed)
Pt and family understood dc material. nad NOTED

## 2017-03-21 NOTE — ED Provider Notes (Signed)
[facility] EMERGENCY DEPARTMENT Provider Note   CSN: [id number] Arrival date & time: 03/21/17  1224     History   Chief Complaint Chief Complaint  Patient presents with  . Loss of Consciousness    HPI Carolyn Werner is a 83 y.o. female.  HPI Carolyn Werner is a 83 y.o. female with history of diabetes, hypertension, peripheral vascular disease, history of CVA, presents to emergency department after a syncopal episode. Patient states that she was fixing herself breakfast, states she was making cereal, states she was putting the mild back on her refrigerator and that is the last thing she remembers. According to patient's daughter who lives with her, patient screamed loudly and then she felt something fall to the ground. When she got to the patient, patient was in her wheelchair but unconscious and was not responding. Patient spilled a carton of milk on the floor. She states that she tried to speak to her but she was not opening her eyes are moving. This lasted several seconds, after the patient woke up but was not aware of what happened. She was alert and oriented. Patient does remember putting the mild refrigerator, denies feeling dizzy or lightheaded. Patient currently denies any symptoms, denies any chest pain or shortness of breath, denies any headache other than chronic headache on the right side that she has had from her prior stroke. Denies any new changes in vision. Denies any difficulty speaking. No new numbness or weakness in her extremities. Patient has had right AKA.  Past Medical History:  Diagnosis Date  . DDD (degenerative disc disease), cervical   . Diabetes mellitus without complication ([name])   . Headache   . Hx of benign neoplasm of pituitary gland   . IBS (irritable colon syndrome)-C   . Mild persistent asthma   . PVD (peripheral vascular disease) ([name])   . Shingles   . Stroke [facility])     Patient Active Problem List   Diagnosis Date Noted   . S/P AKA (above knee amputation) unilateral, right ([name])   . Irritable bowel syndrome   . Diabetic peripheral neuropathy ([name])   . Embolic stroke ([name]) 01/30/2017  . Ataxia, post-stroke   . Diabetes mellitus type 2 in nonobese ([name])   . Stage 3 chronic kidney disease ([name])   . History of right above knee amputation ([name])   . History of CVA (cerebrovascular accident)   . Benign essential HTN   . Transient cerebral ischemia   . Cerebral thrombosis with cerebral infarction 01/28/2017  . Right arm weakness 01/26/2017  . Weakness 01/26/2017  . Hyperlipemia 10/05/2016  . Weakness of both arms   . Left pontine stroke ([name]) 07/28/2016  . 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 Slurred speech 07/27/2016  . CKD (chronic kidney disease) stage 3, GFR 30-59 ml/min ([name]) 07/27/2016  . Diabetes mellitus ([name]) 07/27/2016  . Hypertension 07/27/2016  . Depression 07/27/2016  . Glaucoma 07/27/2016  . Neuropathic pain 07/27/2016  . Asthma 07/27/2016  . Heart palpitations 07/05/2016  . Cardiac murmur 07/05/2016  . Phantom limb pain ([name]) 11/13/2014  . Cervical disc disorder with radiculopathy of cervical region 11/13/2014    Past Surgical History:  Procedure Laterality Date  . ABOVE KNEE LEG AMPUTATION Right 2013  . APPENDECTOMY    . BYPASS GRAFT     ileofemoral   . PITUITARY EXCISION    . TONSILLECTOMY      OB History    No  data available       Home Medications    Prior to Admission medications   Medication Sig Start Date End Date Taking? Authorizing Provider  acetaminophen (TYLENOL) 500 MG tablet Take 1 tablet (500 mg total) by mouth every 6 (six) hours as needed (pain). 02/07/17   Love, Evlyn Kanner, PA-C  aspirin EC 81 MG EC tablet Take 1 tablet (81 mg total) by mouth daily. 08/01/16   Valentino Nose, MD  Biotin 5 MG TABS Take 5 mg by mouth daily with supper.     [provider]  budesonide (PULMICORT) 0.25 MG/2ML nebulizer solution Take  0.25 mg by nebulization See admin instructions. Inhale 1 vial (0.25 mg) via nebulization every other night    [provider]  Cholecalciferol (VITAMIN D) 2000 units CAPS Take 2,000 Units by mouth daily.    [provider]  DULoxetine (CYMBALTA) 30 MG capsule Take 30 mg by mouth daily.    [provider]  Flaxseed, Linseed, (FLAXSEED OIL) 1000 MG CAPS Take 1,000 mg by mouth 3 (three) times daily.    [provider]  fluticasone (FLONASE) 50 MCG/ACT nasal spray Place 1 spray into both nostrils daily as needed for allergies or rhinitis.    [provider]  gabapentin (NEURONTIN) 100 MG capsule Take 1 capsule (100 mg total) by mouth every morning. 08/01/16   Valentino Nose, MD  latanoprost (XALATAN) 0.005 % ophthalmic solution Place 1 drop into both eyes at bedtime.    [provider]  levalbuterol Pauline Aus HFA) 45 MCG/ACT inhaler Inhale 2 puffs into the lungs every 4 (four) hours as needed for wheezing.    [provider]  loratadine (CLARITIN) 10 MG tablet Take 10 mg by mouth daily.     [provider]  Multiple Vitamin (MULTIVITAMIN WITH MINERALS) TABS tablet Take 1 tablet by mouth daily.    [provider]  senna-docusate (SENOKOT-S) 8.6-50 MG tablet Take 1 tablet by mouth at bedtime. Available over the counter 02/07/17   Love, Evlyn Kanner, PA-C  sodium chloride (OCEAN) 0.65 % SOLN nasal spray Place 1 spray into both nostrils daily as needed for congestion.    [provider]  Tetrahydrozoline HCl (VISINE OP) Place 1 drop into both eyes 2 (two) times daily as needed (dry eyes).    [provider]  ticagrelor (BRILINTA) 90 MG TABS tablet Take 1 tablet (90 mg total) by mouth 2 (two) times daily. 02/07/17   Love, Evlyn Kanner, PA-C  traMADol (ULTRAM) 50 MG tablet Take 0.5 tablets (25 mg total) by mouth 2 (two) times daily as needed (pain). 02/07/17   Jacquelynn Cree, PA-C    Family History Family History  Problem  Relation Age of Onset  . Dementia Unknown   . Hypertension Daughter     Social History Social History  Substance Use Topics  . Smoking status: Former Smoker    Packs/day: 0.25    Years: 23.00    Quit date: 06/05/1973  . Smokeless tobacco: Never Used  . Alcohol use No     Allergies   Atacand hct [candesartan cilexetil-hctz]; Carbamazepine; Erythromycin; Glucophage [metformin hcl]; Lactose intolerance (gi); Lipitor [atorvastatin]; Nsaids; Penicillins; Sulfa antibiotics; Ceclor [cefaclor]; Clindamycin/lincomycin; Milk-related compounds; Rosuvastatin calcium; Dilaudid [hydromorphone hcl]; and Plavix [clopidogrel]   Review of Systems Review of Systems  Constitutional: Negative for chills and fever.  Respiratory: Negative for cough, chest tightness and shortness of breath.   Cardiovascular: Negative for chest pain, palpitations and leg swelling.  Gastrointestinal: Negative  for abdominal pain, diarrhea, nausea and vomiting.  Genitourinary: Negative for dysuria, flank pain and pelvic pain.  Musculoskeletal: Negative for arthralgias, myalgias, neck pain and neck stiffness.  Skin: Negative for rash.  Neurological: Positive for syncope and weakness. Negative for dizziness and headaches.  All other systems reviewed and are negative.    Physical Exam Updated Vital Signs BP (!) 150/96   Pulse 89   Temp 97.6 F (36.4 C) (Oral)   Resp 20   Ht 5\' 4"  (1.626 m)   Wt 61.7 kg (136 lb)   SpO2 100%   BMI 23.34 kg/m   Physical Exam  Constitutional: She is oriented to person, place, and time. She appears well-developed and well-nourished. No distress.  HENT:  Head: Normocephalic.  Eyes: Conjunctivae are normal.  Neck: Neck supple.  Cardiovascular: Normal rate, regular rhythm and normal heart sounds.   Pulmonary/Chest: Effort normal and breath sounds normal. No respiratory distress. She has no wheezes. She has no rales.  Abdominal: Soft. Bowel sounds are normal. She exhibits no  distension. There is no tenderness. There is no rebound.  Musculoskeletal: She exhibits no edema.  Right aka  Neurological: She is alert and oriented to person, place, and time. She displays normal reflexes. No cranial nerve deficit. Coordination normal.  5/5 and equal upper and lower extremity strength bilaterally. Equal grip strength bilaterally. Normal finger to nose and heel to shin. No pronator drift.  Skin: Skin is warm and dry.  Psychiatric: She has a normal mood and affect. Her behavior is normal.  Nursing note and vitals reviewed.    ED Treatments / Results  Labs (all labs ordered are listed, but only abnormal results are displayed) Labs Reviewed  COMPREHENSIVE METABOLIC PANEL - Abnormal; Notable for the following:       Result Value   Sodium 133 (*)    CO2 21 (*)    Glucose, Bld 216 (*)    Creatinine, Ser 1.15 (*)    Albumin 3.2 (*)    GFR calc non Af Amer 40 (*)    GFR calc Af Amer 46 (*)    All other components within normal limits  CBC WITH DIFFERENTIAL/PLATELET  URINALYSIS, ROUTINE W REFLEX MICROSCOPIC  I-STAT TROPONIN, ED    EKG  EKG Interpretation None       Radiology Ct Head Wo Contrast  Result Date: 03/21/2017 CLINICAL DATA:  Slurred speech.  Altered level of consciousness. EXAM: CT HEAD WITHOUT CONTRAST TECHNIQUE: Contiguous axial images were obtained from the base of the skull through the vertex without intravenous contrast. COMPARISON:  MRI 01/27/2017 FINDINGS: Brain: Mild atrophy. Negative for hydrocephalus. Mild chronic white matter changes. Chronic infarct left occipital lobe. Negative for acute infarct, hemorrhage, or mass lesion. Vascular: Intracranial atherosclerotic calcification. Negative for hyperdense vessel Skull: Negative Sinuses/Orbits: Chronic sinusitis with diffuse mucoperiosteal thickening in the maxillary sphenoid and ethmoid sinuses. Bilateral cataract removal. Other: None IMPRESSION: No acute intracranial abnormality Chronic sinusitis  Electronically Signed   By: Marlan Palauharles  Clark M.D.   On: 03/21/2017 14:01    Procedures Procedures (including critical care time)  Medications Ordered in ED Medications - No data to display   Initial Impression / Assessment and Plan / ED Course  I have reviewed the triage vital signs and the nursing notes.  Pertinent labs & imaging results that were available during my care of the patient were reviewed by me and considered in my medical decision making (see chart for details).     Pt with syncopal episode  at home, now asymptomatic. Prior CVAs. Will get labs, CT head, monitor. BS normal other than htn. Normal neurological exam. Will monitor.   CT negative. Not orthostatic. Continues to be hypertensive. Asymptomatic. Daughter is concerned about possible acute stroke.  Uncomfortable with dc home. Will speak with [facility] for admission for observation.   4:11 PM Seen By Dr. Konrad Dolores with [facility]. Will get MRI. If negative OK to go home.   Pt signed out pending MRI and Urine  Final Clinical Impressions(s) / ED Diagnoses   Final diagnoses:  Syncope, unspecified syncope type    New Prescriptions New Prescriptions   No medications on file     Jaynie Crumble, PA-C 03/21/17 1623    Mackuen, Cindee Salt, MD 03/26/17 [phone]

## 2017-04-05 ENCOUNTER — Other Ambulatory Visit: Payer: Self-pay | Admitting: Physical Medicine and Rehabilitation

## 2017-04-05 ENCOUNTER — Ambulatory Visit: Payer: Medicare Other | Admitting: Cardiovascular Disease

## 2017-04-07 ENCOUNTER — Other Ambulatory Visit: Payer: Self-pay | Admitting: Physical Medicine and Rehabilitation

## 2017-04-09 ENCOUNTER — Other Ambulatory Visit: Payer: Self-pay

## 2017-04-09 ENCOUNTER — Observation Stay (HOSPITAL_COMMUNITY): Payer: Medicare Other

## 2017-04-09 ENCOUNTER — Inpatient Hospital Stay (HOSPITAL_COMMUNITY)
Admission: EM | Admit: 2017-04-09 | Discharge: 2017-04-13 | DRG: 378 | Disposition: A | Discharge status: Skilled Nursing Facility | Payer: Medicare Other | Attending: Internal Medicine | Admitting: Internal Medicine

## 2017-04-09 ENCOUNTER — Encounter (HOSPITAL_COMMUNITY): Payer: Self-pay | Admitting: *Deleted

## 2017-04-09 DIAGNOSIS — Z886 Allergy status to analgesic agent status: Secondary | ICD-10-CM

## 2017-04-09 DIAGNOSIS — K920 Hematemesis: Secondary | ICD-10-CM | POA: Diagnosis not present

## 2017-04-09 DIAGNOSIS — Z8249 Family history of ischemic heart disease and other diseases of the circulatory system: Secondary | ICD-10-CM

## 2017-04-09 DIAGNOSIS — Z881 Allergy status to other antibiotic agents status: Secondary | ICD-10-CM

## 2017-04-09 DIAGNOSIS — Z89611 Acquired absence of right leg above knee: Secondary | ICD-10-CM

## 2017-04-09 DIAGNOSIS — K581 Irritable bowel syndrome with constipation: Secondary | ICD-10-CM | POA: Diagnosis present

## 2017-04-09 DIAGNOSIS — Z515 Encounter for palliative care: Secondary | ICD-10-CM

## 2017-04-09 DIAGNOSIS — I129 Hypertensive chronic kidney disease with stage 1 through stage 4 chronic kidney disease, or unspecified chronic kidney disease: Secondary | ICD-10-CM | POA: Diagnosis present

## 2017-04-09 DIAGNOSIS — J4530 Mild persistent asthma, uncomplicated: Secondary | ICD-10-CM | POA: Diagnosis present

## 2017-04-09 DIAGNOSIS — Z23 Encounter for immunization: Secondary | ICD-10-CM

## 2017-04-09 DIAGNOSIS — Z888 Allergy status to other drugs, medicaments and biological substances status: Secondary | ICD-10-CM

## 2017-04-09 DIAGNOSIS — K922 Gastrointestinal hemorrhage, unspecified: Secondary | ICD-10-CM | POA: Diagnosis present

## 2017-04-09 DIAGNOSIS — Z87891 Personal history of nicotine dependence: Secondary | ICD-10-CM

## 2017-04-09 DIAGNOSIS — Z66 Do not resuscitate: Secondary | ICD-10-CM | POA: Diagnosis not present

## 2017-04-09 DIAGNOSIS — Z8673 Personal history of transient ischemic attack (TIA), and cerebral infarction without residual deficits: Secondary | ICD-10-CM

## 2017-04-09 DIAGNOSIS — Z880 Allergy status to penicillin: Secondary | ICD-10-CM

## 2017-04-09 DIAGNOSIS — Z7951 Long term (current) use of inhaled steroids: Secondary | ICD-10-CM

## 2017-04-09 DIAGNOSIS — R402362 Coma scale, best motor response, obeys commands, at arrival to emergency department: Secondary | ICD-10-CM | POA: Diagnosis present

## 2017-04-09 DIAGNOSIS — M5030 Other cervical disc degeneration, unspecified cervical region: Secondary | ICD-10-CM | POA: Diagnosis present

## 2017-04-09 DIAGNOSIS — R402142 Coma scale, eyes open, spontaneous, at arrival to emergency department: Secondary | ICD-10-CM | POA: Diagnosis present

## 2017-04-09 DIAGNOSIS — Z7901 Long term (current) use of anticoagulants: Secondary | ICD-10-CM

## 2017-04-09 DIAGNOSIS — E1122 Type 2 diabetes mellitus with diabetic chronic kidney disease: Secondary | ICD-10-CM | POA: Diagnosis present

## 2017-04-09 DIAGNOSIS — E1151 Type 2 diabetes mellitus with diabetic peripheral angiopathy without gangrene: Secondary | ICD-10-CM | POA: Diagnosis present

## 2017-04-09 DIAGNOSIS — Z882 Allergy status to sulfonamides status: Secondary | ICD-10-CM

## 2017-04-09 DIAGNOSIS — E785 Hyperlipidemia, unspecified: Secondary | ICD-10-CM | POA: Diagnosis present

## 2017-04-09 DIAGNOSIS — R402252 Coma scale, best verbal response, oriented, at arrival to emergency department: Secondary | ICD-10-CM | POA: Diagnosis present

## 2017-04-09 DIAGNOSIS — N183 Chronic kidney disease, stage 3 unspecified: Secondary | ICD-10-CM | POA: Diagnosis present

## 2017-04-09 DIAGNOSIS — Z7982 Long term (current) use of aspirin: Secondary | ICD-10-CM

## 2017-04-09 DIAGNOSIS — G43909 Migraine, unspecified, not intractable, without status migrainosus: Secondary | ICD-10-CM | POA: Diagnosis present

## 2017-04-09 DIAGNOSIS — Z7189 Other specified counseling: Secondary | ICD-10-CM

## 2017-04-09 DIAGNOSIS — Z885 Allergy status to narcotic agent status: Secondary | ICD-10-CM

## 2017-04-09 DIAGNOSIS — D696 Thrombocytopenia, unspecified: Secondary | ICD-10-CM | POA: Diagnosis present

## 2017-04-09 DIAGNOSIS — H409 Unspecified glaucoma: Secondary | ICD-10-CM | POA: Diagnosis present

## 2017-04-09 DIAGNOSIS — D62 Acute posthemorrhagic anemia: Secondary | ICD-10-CM | POA: Diagnosis present

## 2017-04-09 DIAGNOSIS — R55 Syncope and collapse: Secondary | ICD-10-CM

## 2017-04-09 DIAGNOSIS — E119 Type 2 diabetes mellitus without complications: Secondary | ICD-10-CM

## 2017-04-09 HISTORY — DX: Unspecified cataract: H26.9

## 2017-04-09 HISTORY — DX: Adverse effect of unspecified anesthetic, initial encounter: T41.45XA

## 2017-04-09 HISTORY — DX: Other complications of anesthesia, initial encounter: T88.59XA

## 2017-04-09 HISTORY — DX: Chronic kidney disease, stage 3 unspecified: N18.30

## 2017-04-09 HISTORY — DX: Chronic kidney disease, stage 3 (moderate): N18.3

## 2017-04-09 HISTORY — DX: Unspecified abdominal hernia without obstruction or gangrene: K46.9

## 2017-04-09 LAB — CBC
HCT: 34.3 % — ABNORMAL LOW (ref 36.0–46.0)
HCT: 33.1 % — ABNORMAL LOW (ref 36.0–46.0)
HEMATOCRIT: 29.2 % — AB (ref 36.0–46.0)
HEMOGLOBIN: 10.9 g/dL — AB (ref 12.0–15.0)
HEMOGLOBIN: 9.7 g/dL — AB (ref 12.0–15.0)
Hemoglobin: 10.6 g/dL — ABNORMAL LOW (ref 12.0–15.0)
MCH: 28.5 pg (ref 26.0–34.0)
MCH: 28.6 pg (ref 26.0–34.0)
MCH: 28.9 pg (ref 26.0–34.0)
MCHC: 31.8 g/dL (ref 30.0–36.0)
MCHC: 32 g/dL (ref 30.0–36.0)
MCHC: 33.2 g/dL (ref 30.0–36.0)
MCV: 89.8 fL (ref 78.0–100.0)
MCV: 89.2 fL (ref 78.0–100.0)
MCV: 86.9 fL (ref 78.0–100.0)
Platelets: 142 10*3/uL — ABNORMAL LOW (ref 150–400)
Platelets: 130 10*3/uL — ABNORMAL LOW (ref 150–400)
Platelets: 142 10*3/uL — ABNORMAL LOW (ref 150–400)
RBC: 3.82 MIL/uL — ABNORMAL LOW (ref 3.87–5.11)
RBC: 3.71 MIL/uL — ABNORMAL LOW (ref 3.87–5.11)
RBC: 3.36 MIL/uL — ABNORMAL LOW (ref 3.87–5.11)
RDW: 12.5 % (ref 11.5–15.5)
RDW: 12.8 % (ref 11.5–15.5)
RDW: 12.5 % (ref 11.5–15.5)
WBC: 22.6 10*3/uL — ABNORMAL HIGH (ref 4.0–10.5)
WBC: 20.4 10*3/uL — AB (ref 4.0–10.5)
WBC: 25.2 10*3/uL — ABNORMAL HIGH (ref 4.0–10.5)

## 2017-04-09 LAB — CBC WITH DIFFERENTIAL/PLATELET
BASOS ABS: 0 10*3/uL (ref 0.0–0.1)
Basophils Relative: 0 %
Eosinophils Absolute: 0.1 10*3/uL (ref 0.0–0.7)
Eosinophils Relative: 1 %
HEMATOCRIT: 37.9 % (ref 36.0–46.0)
Hemoglobin: 12.6 g/dL (ref 12.0–15.0)
LYMPHS PCT: 13 %
Lymphs Abs: 1.8 10*3/uL (ref 0.7–4.0)
MCH: 29.5 pg (ref 26.0–34.0)
MCHC: 33.2 g/dL (ref 30.0–36.0)
MCV: 88.8 fL (ref 78.0–100.0)
MONO ABS: 0.8 10*3/uL (ref 0.1–1.0)
Monocytes Relative: 6 %
NEUTROS ABS: 11.6 10*3/uL — AB (ref 1.7–7.7)
Neutrophils Relative %: 80 %
Platelets: 173 10*3/uL (ref 150–400)
RBC: 4.27 MIL/uL (ref 3.87–5.11)
RDW: 12.5 % (ref 11.5–15.5)
WBC: 14.3 10*3/uL — ABNORMAL HIGH (ref 4.0–10.5)

## 2017-04-09 LAB — URINALYSIS, ROUTINE W REFLEX MICROSCOPIC
Bilirubin Urine: NEGATIVE
Glucose, UA: NEGATIVE mg/dL
HGB URINE DIPSTICK: NEGATIVE
KETONES UR: NEGATIVE mg/dL
NITRITE: NEGATIVE
PROTEIN: NEGATIVE mg/dL
Specific Gravity, Urine: 1.013 (ref 1.005–1.030)
pH: 7 (ref 5.0–8.0)

## 2017-04-09 LAB — COMPREHENSIVE METABOLIC PANEL
ALT: 16 U/L (ref 14–54)
AST: 25 U/L (ref 15–41)
Albumin: 2.9 g/dL — ABNORMAL LOW (ref 3.5–5.0)
Alkaline Phosphatase: 87 U/L (ref 38–126)
Anion gap: 10 (ref 5–15)
BILIRUBIN TOTAL: 1 mg/dL (ref 0.3–1.2)
BUN: 35 mg/dL — AB (ref 6–20)
CALCIUM: 9.4 mg/dL (ref 8.9–10.3)
CO2: 23 mmol/L (ref 22–32)
Chloride: 100 mmol/L — ABNORMAL LOW (ref 101–111)
Creatinine, Ser: 1.25 mg/dL — ABNORMAL HIGH (ref 0.44–1.00)
GFR calc Af Amer: 42 mL/min — ABNORMAL LOW (ref >60)
GFR, EST NON AFRICAN AMERICAN: 36 mL/min — AB (ref >60)
Glucose, Bld: 238 mg/dL — ABNORMAL HIGH (ref 65–99)
Potassium: 4.6 mmol/L (ref 3.5–5.1)
Sodium: 133 mmol/L — ABNORMAL LOW (ref 135–145)
Total Protein: 6.4 g/dL — ABNORMAL LOW (ref 6.5–8.1)

## 2017-04-09 LAB — PROTIME-INR
INR: 1.02
PROTHROMBIN TIME: 13.3 s (ref 11.4–15.2)

## 2017-04-09 LAB — I-STAT CG4 LACTIC ACID, ED: Lactic Acid, Venous: 2.01 mmol/L (ref 0.5–1.9)

## 2017-04-09 LAB — PREPARE RBC (CROSSMATCH)

## 2017-04-09 LAB — CBG MONITORING, ED: GLUCOSE-CAPILLARY: 238 mg/dL — AB (ref 65–99)

## 2017-04-09 LAB — I-STAT TROPONIN, ED: TROPONIN I, POC: 0 ng/mL (ref 0.00–0.08)

## 2017-04-09 LAB — LIPASE, BLOOD: LIPASE: 17 U/L (ref 11–51)

## 2017-04-09 LAB — ABO/RH: ABO/RH(D): A POS

## 2017-04-09 LAB — GLUCOSE, CAPILLARY
GLUCOSE-CAPILLARY: 114 mg/dL — AB (ref 65–99)
Glucose-Capillary: 157 mg/dL — ABNORMAL HIGH (ref 65–99)

## 2017-04-09 LAB — POC OCCULT BLOOD, ED: FECAL OCCULT BLD: POSITIVE — AB

## 2017-04-09 MED ORDER — TRAMADOL HCL 50 MG PO TABS
25.0000 mg | ORAL_TABLET | Freq: Two times a day (BID) | ORAL | Status: DC | PRN
  Administered 2017-04-09 – 2017-04-12 (×4): 25 mg via ORAL
  Filled 2017-04-09 (×6): qty 1

## 2017-04-09 MED ORDER — GABAPENTIN 100 MG PO CAPS: 100.0000 mg | ORAL_CAPSULE | ORAL | Status: DC

## 2017-04-09 MED ORDER — GABAPENTIN 100 MG PO CAPS
200.0000 mg | ORAL_CAPSULE | Freq: Every day | ORAL | Status: DC
  Administered 2017-04-09 – 2017-04-12 (×4): 200 mg via ORAL
  Filled 2017-04-09 (×4): qty 2

## 2017-04-09 MED ORDER — ACETAMINOPHEN 325 MG PO TABS
650.0000 mg | ORAL_TABLET | Freq: Four times a day (QID) | ORAL | Status: DC | PRN
  Administered 2017-04-09 – 2017-04-12 (×3): 650 mg via ORAL
  Filled 2017-04-09 (×3): qty 2

## 2017-04-09 MED ORDER — SODIUM CHLORIDE 0.9 % IV SOLN
80.0000 mg | Freq: Once | INTRAVENOUS | Status: AC
  Administered 2017-04-09: 07:00:00 80 mg via INTRAVENOUS
  Filled 2017-04-09: qty 80

## 2017-04-09 MED ORDER — ONDANSETRON HCL 4 MG PO TABS: 4.0000 mg | ORAL_TABLET | Freq: Four times a day (QID) | ORAL | Status: DC | PRN

## 2017-04-09 MED ORDER — LEVALBUTEROL TARTRATE 45 MCG/ACT IN AERO: 2.0000 | INHALATION_SPRAY | RESPIRATORY_TRACT | Status: DC | PRN

## 2017-04-09 MED ORDER — SODIUM CHLORIDE 0.9 % IV SOLN
INTRAVENOUS | Status: AC
  Administered 2017-04-09 – 2017-04-10 (×3): via INTRAVENOUS

## 2017-04-09 MED ORDER — SODIUM CHLORIDE 0.9 % IV SOLN: INTRAVENOUS | Status: DC

## 2017-04-09 MED ORDER — INFLUENZA VAC SPLIT HIGH-DOSE 0.5 ML IM SUSY
0.5000 mL | PREFILLED_SYRINGE | INTRAMUSCULAR | Status: AC
  Administered 2017-04-10: 0.5 mL via INTRAMUSCULAR
  Filled 2017-04-09: qty 0.5

## 2017-04-09 MED ORDER — SENNOSIDES-DOCUSATE SODIUM 8.6-50 MG PO TABS
1.0000 | ORAL_TABLET | Freq: Every evening | ORAL | Status: DC | PRN
  Administered 2017-04-09: 1 via ORAL
  Administered 2017-04-11: 2 via ORAL
  Filled 2017-04-09: qty 1
  Filled 2017-04-09: qty 2
  Filled 2017-04-09: qty 1

## 2017-04-09 MED ORDER — SODIUM CHLORIDE 0.9 % IV BOLUS (SEPSIS)
1000.0000 mL | Freq: Once | INTRAVENOUS | Status: AC
  Administered 2017-04-09: 1000 mL via INTRAVENOUS

## 2017-04-09 MED ORDER — GABAPENTIN 100 MG PO CAPS
100.0000 mg | ORAL_CAPSULE | Freq: Every day | ORAL | Status: DC
  Administered 2017-04-09 – 2017-04-13 (×5): 100 mg via ORAL
  Filled 2017-04-09 (×5): qty 1

## 2017-04-09 MED ORDER — ACETAMINOPHEN 650 MG RE SUPP: 650.0000 mg | Freq: Four times a day (QID) | RECTAL | Status: DC | PRN

## 2017-04-09 MED ORDER — BUDESONIDE 0.25 MG/2ML IN SUSP
0.2500 mg | Freq: Every day | RESPIRATORY_TRACT | Status: DC
  Administered 2017-04-09 – 2017-04-11 (×3): 0.25 mg via RESPIRATORY_TRACT
  Filled 2017-04-09 (×3): qty 2

## 2017-04-09 MED ORDER — ONDANSETRON HCL 4 MG/2ML IJ SOLN: 4.0000 mg | Freq: Four times a day (QID) | INTRAMUSCULAR | Status: DC | PRN

## 2017-04-09 MED ORDER — INSULIN ASPART 100 UNIT/ML ~~LOC~~ SOLN
0.0000 [IU] | Freq: Three times a day (TID) | SUBCUTANEOUS | Status: DC
  Administered 2017-04-11: 1 [IU] via SUBCUTANEOUS
  Administered 2017-04-13: 2 [IU] via SUBCUTANEOUS

## 2017-04-09 MED ORDER — DULOXETINE HCL 30 MG PO CPEP
30.0000 mg | ORAL_CAPSULE | Freq: Every day | ORAL | Status: DC
  Administered 2017-04-09 – 2017-04-13 (×5): 30 mg via ORAL
  Filled 2017-04-09 (×5): qty 1

## 2017-04-09 MED ORDER — LEVALBUTEROL HCL 0.63 MG/3ML IN NEBU: 0.6300 mg | INHALATION_SOLUTION | RESPIRATORY_TRACT | Status: DC | PRN

## 2017-04-09 MED ORDER — ONDANSETRON HCL 4 MG/2ML IJ SOLN
4.0000 mg | Freq: Once | INTRAMUSCULAR | Status: AC
  Administered 2017-04-09: 4 mg via INTRAVENOUS
  Filled 2017-04-09: qty 2

## 2017-04-09 MED ORDER — SODIUM CHLORIDE 0.9 % IV SOLN
8.0000 mg/h | INTRAVENOUS | Status: DC
  Administered 2017-04-09 – 2017-04-10 (×2): 8 mg/h via INTRAVENOUS
  Filled 2017-04-09 (×5): qty 80

## 2017-04-09 MED ORDER — PANTOPRAZOLE SODIUM 40 MG IV SOLR: 40.0000 mg | Freq: Two times a day (BID) | INTRAVENOUS | Status: DC

## 2017-04-09 NOTE — Progress Notes (Signed)
Paged by RN at 1300. Patient now with low grade fever 99.8 and tachycardia with HR 130s. RN stated patient appears tired and fatigued, but is otherwise stable.  Currently normotensive.  Went to see patient at bedside.  She was accompanied by her daughter.  Patient stated that she did not feel like herself and felt that something was wrong but was unable to elaborate on her symptoms.  Denies chest pain, shortness of breath, headache, abdominal pain, N/V, and changed in bowel movements.  Did not report any other symptoms other than not feeling like herself.  She also stated that if it is her time to go, to let her go.  She was resting comfortably in bed when seen and able to speak in full sentences.  She was noted to be tachycardic on cardiac exam, but no murmurs, rubs, or gallops.  Pulmonary exam was normal.  Her abdomen was soft, nondistended, and nontender. She was alert and oriented x3 and able to voice concerns and needs. Patient was reassured.  Discussed with daughter patient's management and plan. She voiced understanding and is agreement with plan.    Plan: Chest x-ray, urine culture, and blood cultures ordered to evaluate source of infection given new leukocytosis and new low grade fever.  We will continue to monitor CBCs every 4 hours and plan to transfuse if hemoglobin less than 7. Currently on NS @ 14500mL/hr. Will bolus if she becomes hypotensive.

## 2017-04-09 NOTE — H&P (Signed)
Date: 04/09/2017               Patient Name:  Carolyn Werner MRN: [id number]  DOB: [birth year] Age / Sex: 83 y.o., female   PCP: Lynn Ito, MD         Medical Service: Internal Medicine Teaching Service         Attending Physician: Dr. Burns Spain, MD    First Contact: Dr. Evelene Croon Pager: [phone]  Second Contact: Dr. Antony Contras Pager: [phone]       After Hours (After 5p/  First Contact Pager: [phone]  weekends / holidays): Second Contact Pager: [phone]   Chief Complaint: Spit up blood  History of Present Illness:  [name] is a 83 year old female with PMH significant for at least 2 left sided CVAs (on DAPT with ASA/Brillinta), Left ICA Stenosis (80-99%), PVD s/p Right AKA, T2DM, HTN, HLD, and Asthma who presents with complaint of spitting up blood.   She says she was in her usual state of health and felt fine when she went to bed last night (04/08/17). She noticed during the night that she was sweating extensively and had to change her night gown 3 times. She felt weak and lightheaded. She became nauseous and went to the bathroom at which point she had at least 2 episodes of emesis which she described as a "dark raspberry" color. A family friend was staying with her as her daughter is out of town in [place] and reports seeing the same. She has been taking ASA 81 mg daily and Brillinta 90 mg BID consistently until yesterday. Patient denies any similar episodes in the past or other obvious bleeding. She takes occasional Tylenol and Tramadol for pain and denies use of NSAIDs. She was seen in the Urgent Care on 03/29/17 for cough and chest congestion and treated with Prednisone and Doxycycline for suspected bronchopneumonia which she has completed.  In the ED, her vitals were: BP 129/70   Pulse 98   Temp (!) 97.4 F (36.3 C)   Resp (!) 22   SpO2 98%  An FOBT was positive for occult blood without melena on DRE per ED provider. Lab work was notable for Hgb  12.6 at her baseline, WBC 14.3, BUN 35, Cr 1.25 (b/l 1.2 -1.4), Lipase 17, I-stat Troponin 0.00, and Lactic acid 2.01. She was given zofran for nausea and started on a Protonix drip after bolus.   Meds:  Current Meds  Medication Sig  . acetaminophen (TYLENOL) 500 MG tablet Take 1 tablet (500 mg total) by mouth every 6 (six) hours as needed (pain).  Marland Kitchen aspirin EC 81 MG EC tablet Take 1 tablet (81 mg total) by mouth daily.  . Biotin 5 MG TABS Take 5 mg by mouth daily with supper.   . budesonide (PULMICORT) 0.25 MG/2ML nebulizer solution Take 0.25 mg by nebulization daily.   . Cholecalciferol (VITAMIN D) 2000 units CAPS Take 2,000 Units by mouth daily.  . DULoxetine (CYMBALTA) 30 MG capsule Take 30 mg by mouth daily.  . Flaxseed, Linseed, (FLAXSEED OIL) 1000 MG CAPS Take 1,000 mg by mouth 3 (three) times daily.  . fluticasone (FLONASE) 50 MCG/ACT nasal spray Place 1 spray into both nostrils daily as needed for allergies or rhinitis.  Marland Kitchen gabapentin (NEURONTIN) 100 MG capsule Take 1 capsule (100 mg total) by mouth every morning. (Patient taking differently: Take 100-200 mg by mouth See admin instructions. 100mg  in am, 200mg  in pm)  . latanoprost (XALATAN) 0.005 %  ophthalmic solution Place 1 drop into both eyes at bedtime.  . levalbuterol (XOPENEX HFA) 45 MCG/ACT inhaler Inhale 2 puffs into the lungs every 4 (four) hours as needed for wheezing.  Marland Kitchen loratadine (CLARITIN) 10 MG tablet Take 10 mg by mouth daily.   . Multiple Vitamin (MULTIVITAMIN WITH MINERALS) TABS tablet Take 1 tablet by mouth daily.  Bertram Gala Glycol-Propyl Glycol (SYSTANE ULTRA OP) Place 1 drop into both eyes 2 (two) times daily as needed (dry eyes).  Marland Kitchen senna-docusate (SENOKOT-S) 8.6-50 MG tablet Take 1 tablet by mouth at bedtime. Available over the counter (Patient taking differently: Take 2 tablets by mouth at bedtime. Available over the counter)  . sodium chloride (OCEAN) 0.65 % SOLN nasal spray Place 1 spray into both nostrils  daily as needed for congestion.  . ticagrelor (BRILINTA) 90 MG TABS tablet Take 1 tablet (90 mg total) by mouth 2 (two) times daily.  . traMADol (ULTRAM) 50 MG tablet Take 0.5 tablets (25 mg total) by mouth 2 (two) times daily as needed (pain).     Allergies: Allergies as of 04/09/2017 - Review Complete 04/09/2017  Allergen Reaction Noted  . Atacand hct [candesartan cilexetil-hctz] Other (See Comments) 07/27/2016  . Carbamazepine Other (See Comments) 07/27/2016  . Erythromycin Diarrhea 07/27/2016  . Glucophage [metformin hcl] Other (See Comments) 07/27/2016  . Lactose intolerance (gi) Diarrhea and Nausea And Vomiting 01/27/2017  . Lipitor [atorvastatin] Other (See Comments) 07/27/2016  . Nsaids Nausea And Vomiting 07/27/2016  . Penicillins Shortness Of Breath and Rash 09/16/2014  . Sulfa antibiotics Anaphylaxis 07/03/2016  . Ceclor [cefaclor] Hives 09/16/2014  . Clindamycin/lincomycin Other (See Comments) 07/27/2016  . Milk-related compounds Other (See Comments) 01/31/2017  . Rosuvastatin calcium Other (See Comments) 07/28/2016  . Dilaudid [hydromorphone hcl] Rash 09/16/2014  . Plavix [clopidogrel] Rash and Other (See Comments) 09/16/2014   Past Medical History:  Diagnosis Date  . DDD (degenerative disc disease), cervical   . Diabetes mellitus without complication ([name])   . Headache   . Hx of benign neoplasm of pituitary gland   . IBS (irritable colon syndrome)-C   . Mild persistent asthma   . PVD (peripheral vascular disease) ([name])   . Shingles   . Stroke [facility])     Family History:  Mother with Alzheimer's. She does not know her father's medical history.  Social History:  Social History   Socioeconomic History  . Marital status: Widowed    Spouse name: None  . Number of children: 3  . Years of education: None  . Highest education level: None  Social Needs  . Financial resource strain: None  . Food insecurity - worry: None  . Food insecurity - inability: None  .  Transportation needs - medical: None  . Transportation needs - non-medical: None  Occupational History  . None  Tobacco Use  . Smoking status: Former Smoker    Packs/day: 0.25    Years: 23.00    Pack years: 5.75    Last attempt to quit: 06/05/1973    Years since quitting: 43.8  . Smokeless tobacco: Never Used  Substance and Sexual Activity  . Alcohol use: No    Alcohol/week: 0.0 oz  . Drug use: No  . Sexual activity: No  Other Topics Concern  . None  Social History Narrative   Lives at home w/ her daughter. Accepted to [facility] in [place], [place]; on waitlist pending bed availability.   Right-handed   Caffeine: decaf coffee      Review of  Systems: A complete ROS was negative except as per HPI.   Physical Exam: Blood pressure 118/73, pulse (!) 110, temperature (!) 97.4 F (36.3 C), resp. rate (!) 23, SpO2 97 %. Physical Exam  Constitutional: She is oriented to person, place, and time. She appears well-developed and well-nourished. No distress.  HENT:  Head: Normocephalic and atraumatic.  Oropharynx clear, tongue appears dry. Bilateral hearing aids in place.  Eyes: Conjunctivae are normal.  Neck: Neck supple.  Cardiovascular: Regular rhythm.  Tachycardic, 2/6 systolic murmur loudest at the RUS border  Pulmonary/Chest: Effort normal. No stridor. No respiratory distress. She has no wheezes.  Abdominal: Soft. Bowel sounds are normal. She exhibits no distension.  Mild epigastric tenderness on palpation  Musculoskeletal: She exhibits no edema.  S/p right AKA  Neurological: She is alert and oriented to person, place, and time.  Skin: Skin is warm. She is not diaphoretic.  Psychiatric: She has a normal mood and affect.     EKG: personally reviewed my interpretation is sinus rhythm, rate 96 bpm, no acute ischemic changes  CXR: not obtained  Assessment & Plan by Problem: Principal Problem:   Acute upper GI bleed Active Problems:   CKD (chronic kidney disease) stage  3, GFR 30-59 ml/min ([name])   Diabetes mellitus ([name])   History of stroke  83 year old female with PMH significant for at least 2 left sided CVAs (on DAPT with ASA/Brillinta), Left ICA Stenosis (80-99%), PVD s/p Right AKA, T2DM, HTN, HLD, and Asthma who presents with suspected acute upper GI bleed.  Upper GI Bleed: Patient reports that her symptoms were of emesis and not hemoptysis. She has a suspected upper GI bleed in the setting of dual antiplatelet therapy and recent prednisone use. Elevated BUN would support upper GI bleed as well. Her Hgb was at baseline on arrival, however this can be the case in early acute upper GI bleeds. She was initially stable when seen but did have a syncopal episode when transferred to the bedside toilet with assist afterwards. She was diaphoretic and did lose consciousness per nursing staff before regaining consciousness. She did not experience any chest pain, palpitations, or dyspnea during this episode. Her CBG was 238 and SBP was in the 90s with MAP 80s. She did not have a bowel movement. Will obtain STAT CBC and start IV fluids. Admit to telemetry floor. She has been typed and screened. I did discuss the possibility of further evaluation for upper GI bleeding with an EGD with her and her daughter, for which they are agreeable if necessary. - STAT CBC; transfuse if Hgb with significant drop from prior - IV NS 1000 mL bolus now followed by 100 mL/hr - NPO for now - Continue Protonix gtt for now - Zofran prn - GI consulted - Hold ASA and Brillinta  Hx of CVA: Patient with at least 2 left sided CVAs, most recently in August 2018 with a new left paramedian pontine stroke. She has been on DAPT with ASA 81 mg daily and Brillinta 90 mg BID since then. Will hold antiplatelets for now in setting of suspected upper GI bleed, however will need to discuss long-term risks/benefits for secondary prevention of stroke versus further bleeding risk.  T2DM: Diet controlled at  home. - SSI-S   Dispo: Admit patient to Observation with expected length of stay less than 2 midnights.  Signed: Darreld McleanPatel, Affan Callow, MD 04/09/2017, 10:13 AM

## 2017-04-09 NOTE — ED Triage Notes (Signed)
Pt arrived by EMS from home. Pt woke up around 3am feeling nausea and weak; had an episode of coffee ground emesis, denies abd pain. Pt is on Brilinta for a previous stroke.

## 2017-04-09 NOTE — Consult Note (Signed)
[facility] Gastroenterology Consultation Note  Referring Provider: Dr. Blanch Media (Teaching Service) Primary Care Physician:  Lynn Ito, MD  Reason for Consultation:  hematemesis  HPI: Carolyn Werner is a 83 y.o. female with two strokes within the past one year, first in February with placement on aggrenox, second in August 2018 despite Aggrenox and placed on ticagrelor.  Presents with one day history of lethargy, altered mentation and dark emesis.  No witnessed episodes by patient's daughter or while in hospital, and no episodes of red hematemesis, hematochezia or melena.  No prior GI bleeding; was not on PPI prior to admission.   Past Medical History:  Diagnosis Date  . Abdominal hernia   . Cataracts, both eyes   . CKD (chronic kidney disease), stage III ([name])   . Complication of anesthesia    CONFUSION   . DDD (degenerative disc disease), cervical   . Diabetes mellitus without complication ([name])   . Headache   . Hx of benign neoplasm of pituitary gland   . IBS (irritable colon syndrome)-C   . Mild persistent asthma   . PVD (peripheral vascular disease) ([name])   . Shingles   . Stroke [facility])     Past Surgical History:  Procedure Laterality Date  . ABOVE KNEE LEG AMPUTATION Right 2013  . APPENDECTOMY    . BYPASS GRAFT     ileofemoral   . PITUITARY EXCISION    . TONSILLECTOMY      Prior to Admission medications   Medication Sig Start Date End Date Taking? Authorizing Provider  acetaminophen (TYLENOL) 500 MG tablet Take 1 tablet (500 mg total) by mouth every 6 (six) hours as needed (pain). 02/07/17  Yes Love, Evlyn Kanner, PA-C  aspirin EC 81 MG EC tablet Take 1 tablet (81 mg total) by mouth daily. 08/01/16  Yes Valentino Nose, MD  Biotin 5 MG TABS Take 5 mg by mouth daily with supper.    Yes [provider]  budesonide (PULMICORT) 0.25 MG/2ML nebulizer solution Take 0.25 mg by nebulization daily.    Yes [provider]  Cholecalciferol (VITAMIN D) 2000  units CAPS Take 2,000 Units by mouth daily.   Yes [provider]  DULoxetine (CYMBALTA) 30 MG capsule Take 30 mg by mouth daily.   Yes [provider]  Flaxseed, Linseed, (FLAXSEED OIL) 1000 MG CAPS Take 1,000 mg by mouth 3 (three) times daily.   Yes [provider]  fluticasone (FLONASE) 50 MCG/ACT nasal spray Place 1 spray into both nostrils daily as needed for allergies or rhinitis.   Yes [provider]  gabapentin (NEURONTIN) 100 MG capsule Take 1 capsule (100 mg total) by mouth every morning. Patient taking differently: Take 100-200 mg by mouth See admin instructions. 100mg  in am, 200mg  in pm 08/01/16  Yes Valentino Nose, MD  latanoprost (XALATAN) 0.005 % ophthalmic solution Place 1 drop into both eyes at bedtime.   Yes [provider]  levalbuterol (XOPENEX HFA) 45 MCG/ACT inhaler Inhale 2 puffs into the lungs every 4 (four) hours as needed for wheezing.   Yes [provider]  loratadine (CLARITIN) 10 MG tablet Take 10 mg by mouth daily.    Yes [provider]  Multiple Vitamin (MULTIVITAMIN WITH MINERALS) TABS tablet Take 1 tablet by mouth daily.   Yes [provider]  Polyethyl Glycol-Propyl Glycol (SYSTANE ULTRA OP) Place 1 drop into both eyes 2 (two) times daily as needed (dry eyes).   Yes [provider]  senna-docusate (SENOKOT-S) 8.6-50 MG  tablet Take 1 tablet by mouth at bedtime. Available over the counter Patient taking differently: Take 2 tablets by mouth at bedtime. Available over the counter 02/07/17  Yes Love, Evlyn Kanneramela S, PA-C  sodium chloride (OCEAN) 0.65 % SOLN nasal spray Place 1 spray into both nostrils daily as needed for congestion.   Yes [provider]  ticagrelor (BRILINTA) 90 MG TABS tablet Take 1 tablet (90 mg total) by mouth 2 (two) times daily. 02/07/17  Yes Love, Evlyn KannerPamela S, PA-C  traMADol (ULTRAM) 50 MG tablet Take 0.5 tablets (25 mg total) by mouth 2 (two) times daily as needed  (pain). 02/07/17  Yes Love, Evlyn KannerPamela S, PA-C    Current Facility-Administered Medications  Medication Dose Route Frequency Provider Last Rate Last Dose  . 0.9 %  sodium chloride infusion   Intravenous Continuous Darreld McleanPatel, Vishal, MD 100 mL/hr at 04/09/17 1038    . acetaminophen (TYLENOL) tablet 650 mg  650 mg Oral Q6H PRN Darreld McleanPatel, Vishal, MD   650 mg at 04/09/17 1219   Or  . acetaminophen (TYLENOL) suppository 650 mg  650 mg Rectal Q6H PRN Darreld McleanPatel, Vishal, MD      . budesonide (PULMICORT) nebulizer solution 0.25 mg  0.25 mg Nebulization Daily Darreld McleanPatel, Vishal, MD   0.25 mg at 04/09/17 1004  . DULoxetine (CYMBALTA) DR capsule 30 mg  30 mg Oral Daily Santos-Sanchez, Idalys, MD   30 mg at 04/09/17 1301  . gabapentin (NEURONTIN) capsule 100 mg  100 mg Oral Daily Burns SpainButcher, Elizabeth A, MD   100 mg at 04/09/17 1301   And  . gabapentin (NEURONTIN) capsule 200 mg  200 mg Oral QHS Burns SpainButcher, Elizabeth A, MD      . Melene Muller[START ON 04/10/2017] Influenza vac split quadrivalent PF (FLUZONE HIGH-DOSE) injection 0.5 mL  0.5 mL Intramuscular Tomorrow-1000 Burns SpainButcher, Elizabeth A, MD      . insulin aspart (novoLOG) injection 0-9 Units  0-9 Units Subcutaneous TID WC Darreld McleanPatel, Vishal, MD      . levalbuterol (XOPENEX) nebulizer solution 0.63 mg  0.63 mg Nebulization Q4H PRN Burns SpainButcher, Elizabeth A, MD      . ondansetron [facility](ZOFRAN) tablet 4 mg  4 mg Oral Q6H PRN Darreld McleanPatel, Vishal, MD       Or  . ondansetron (ZOFRAN) injection 4 mg  4 mg Intravenous Q6H PRN Darreld McleanPatel, Vishal, MD      . pantoprazole (PROTONIX) 80 mg in sodium chloride 0.9 % 250 mL (0.32 mg/mL) infusion  8 mg/hr Intravenous Continuous Gilda CreasePollina, Christopher J, MD 25 mL/hr at 04/09/17 0749 8 mg/hr at 04/09/17 0749  . [START ON 04/12/2017] pantoprazole (PROTONIX) injection 40 mg  40 mg Intravenous Q12H Pollina, Canary Brimhristopher J, MD      . traMADol Janean Sark(ULTRAM) tablet 25 mg  25 mg Oral BID PRN Burna CashSantos-Sanchez, Idalys, MD   25 mg at 04/09/17 1301    Allergies as of 04/09/2017 - Review Complete 04/09/2017   Allergen Reaction Noted  . Atacand hct [candesartan cilexetil-hctz] Other (See Comments) 07/27/2016  . Carbamazepine Other (See Comments) 07/27/2016  . Erythromycin Diarrhea 07/27/2016  . Glucophage [metformin hcl] Other (See Comments) 07/27/2016  . Lactose intolerance (gi) Diarrhea and Nausea And Vomiting 01/27/2017  . Lipitor [atorvastatin] Other (See Comments) 07/27/2016  . Nsaids Nausea And Vomiting 07/27/2016  . Penicillins Shortness Of Breath and Rash 09/16/2014  . Sulfa antibiotics Anaphylaxis 07/03/2016  . Ceclor [cefaclor] Hives 09/16/2014  . Clindamycin/lincomycin Other (See Comments) 07/27/2016  . Milk-related compounds Other (See Comments) 01/31/2017  . Rosuvastatin calcium Other (See  Comments) 07/28/2016  . Dilaudid [hydromorphone hcl] Rash 09/16/2014  . Plavix [clopidogrel] Rash and Other (See Comments) 09/16/2014    Family History  Problem Relation Age of Onset  . Dementia Unknown   . Hypertension Daughter     Social History   Socioeconomic History  . Marital status: Widowed    Spouse name: Not on file  . Number of children: 3  . Years of education: Not on file  . Highest education level: Not on file  Social Needs  . Financial resource strain: Not on file  . Food insecurity - worry: Not on file  . Food insecurity - inability: Not on file  . Transportation needs - medical: Not on file  . Transportation needs - non-medical: Not on file  Occupational History  . Not on file  Tobacco Use  . Smoking status: Former Smoker    Packs/day: 0.25    Years: 23.00    Pack years: 5.75    Last attempt to quit: 06/05/1973    Years since quitting: 43.8  . Smokeless tobacco: Never Used  Substance and Sexual Activity  . Alcohol use: No    Alcohol/week: 0.0 oz  . Drug use: No  . Sexual activity: No  Other Topics Concern  . Not on file  Social History Narrative   Lives at home w/ her daughter. Accepted to [facility] in [place], [place]; on waitlist pending bed  availability.   Right-handed   Caffeine: decaf coffee    Review of Systems: Unable to obtain due to patient's altered mentation.  Physical Exam: Vital signs in last 24 hours: Temp:  [97.4 F (36.3 C)-99.8 F (37.7 C)] 99.8 F (37.7 C) (11/05 1300) Pulse Rate:  [98-117] 110 (11/05 0900) Resp:  [18-26] 23 (11/05 0900) BP: (94-129)/(68-77) 111/71 (11/05 1300) SpO2:  [96 %-100 %] 100 % (11/05 1300)   General:   Awake, variable alertness, NAD Head:  Normocephalic and atraumatic. Eyes:  Sclera clear, no icterus.   Conjunctiva pink. Ears:  Normal auditory acuity. Nose:  No deformity, discharge,  or lesions. Mouth:  No deformity or lesions.  Oropharynx pink & moist. Neck:  Supple; no masses or thyromegaly. Abdomen:  Soft, nontender and nondistended. No masses, hepatosplenomegaly or hernias noted. Normal bowel sounds, without guarding, and without rebound.     Msk:  Right above knee amputation, otherwise symmetrical without gross deformities. Normal posture. Pulses:  Normal pulses noted. Extremities:  Without clubbing or edema. Neurologic: Variable and fluctuating level of alertness Skin:  Intact without significant lesions or rashes. Psych:  Variable alertness and level of alertness   Lab Results: Recent Labs    04/09/17 0557 04/09/17 1011  WBC 14.3* 22.6*  HGB 12.6 10.9*  HCT 37.9 34.3*  PLT 173 142*   BMET Recent Labs    04/09/17 0557  NA 133*  K 4.6  CL 100*  CO2 23  GLUCOSE 238*  BUN 35*  CREATININE 1.25*  CALCIUM 9.4   LFT Recent Labs    04/09/17 0557  PROT 6.4*  ALBUMIN 2.9*  AST 25  ALT 16  ALKPHOS 87  BILITOT 1.0   PT/INR Recent Labs    04/09/17 0557  LABPROT 13.3  INR 1.02    Studies/Results: No results found.  Impression:  1.  Dark emesis. 2.  Mild anemia. 3.  Recurrent stroke, last August 2018. 4.  Chronic anticoagulation, aspirin and ticagrelor.  Plan:  1.  I had long talk with patient (whom I doubt understood the bulk  of our  conversation0 and her daughter.  We are in agreement that, in light of patient's age, comorbidities (especially recent stroke) and recent altered mentation, we should not pursue aggressive interventions, including endoscopy, in absence of (a) failure of conservative therapy and (b) destabilizing bleeding despite anticoagulant and antiplatelet cessation. 2.  Accordingly, would treat conservatively with PPI, IVF, serial CBCs. 3.  Will hold anticoagulation for now; daughter is not sure whether or not they would want this restarted regardless. 4.  [facility] will follow.     LOS: 0 days   Dyanara Cozza M  04/09/2017, 2:19 PM  Cell [phone] If no answer or after 5 PM call [phone]

## 2017-04-09 NOTE — ED Provider Notes (Signed)
[facility] Provider Note   CSN: [id number] Arrival date & time: 04/09/17  [id number]     History   Chief Complaint Chief Complaint  Patient presents with  . GI Bleeding    HPI Carolyn Werner is a 83 y.o. female.  Patient brought to the emergency department from home with apparent GI bleed.  Patient reports that she was feeling well when she went to bed last night.  She woke up at 3 AM feeling sick.  She went to the bathroom and had several episodes of emesis.  EMS report that the emesis was coffee grounds.  Patient brought to the ER for further evaluation.  On arrival she reports nausea, upset stomach with weakness.  No abdominal pain.  No history of GI bleed.  Patient is on Brilinta.      Past Medical History:  Diagnosis Date  . DDD (degenerative disc disease), cervical   . Diabetes mellitus without complication ([name])   . Headache   . Hx of benign neoplasm of pituitary gland   . IBS (irritable colon syndrome)-C   . Mild persistent asthma   . PVD (peripheral vascular disease) ([name])   . Shingles   . Stroke [facility]([name])     Patient Active Problem List   Diagnosis Date Noted  . Syncope, vasovagal 03/21/2017  . Carotid stenosis 03/21/2017  . S/P AKA (above knee amputation) unilateral, right ([name])   . Irritable bowel syndrome   . Diabetic peripheral neuropathy ([name])   . Embolic stroke ([name]) 01/30/2017  . Ataxia, post-stroke   . Diabetes mellitus type 2 in nonobese ([name])   . Stage 3 chronic kidney disease ([name])   . History of right above knee amputation ([name])   . History of stroke   . Benign essential HTN   . Transient cerebral ischemia   . Cerebral thrombosis with cerebral infarction 01/28/2017  . Right arm weakness 01/26/2017  . Weakness 01/26/2017  . Hyperlipemia 10/05/2016  . Weakness of both arms   . Left pontine stroke ([name]) 07/28/2016  . 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 Slurred speech  07/27/2016  . CKD (chronic kidney disease) stage 3, GFR 30-59 ml/min ([name]) 07/27/2016  . Diabetes mellitus ([name]) 07/27/2016  . Essential hypertension 07/27/2016  . Depression 07/27/2016  . Glaucoma 07/27/2016  . Neuropathic pain 07/27/2016  . Asthma 07/27/2016  . Heart palpitations 07/05/2016  . Cardiac murmur 07/05/2016  . Phantom limb pain ([name]) 11/13/2014  . Cervical disc disorder with radiculopathy of cervical region 11/13/2014    Past Surgical History:  Procedure Laterality Date  . ABOVE KNEE LEG AMPUTATION Right 2013  . APPENDECTOMY    . BYPASS GRAFT     ileofemoral   . PITUITARY EXCISION    . TONSILLECTOMY      OB History    No data available       Home Medications    Prior to Admission medications   Medication Sig Start Date End Date Taking? Authorizing Provider  acetaminophen (TYLENOL) 500 MG tablet Take 1 tablet (500 mg total) by mouth every 6 (six) hours as needed (pain). 02/07/17  Yes Love, Evlyn Kanneramela S, PA-C  aspirin EC 81 MG EC tablet Take 1 tablet (81 mg total) by mouth daily. 08/01/16  Yes Valentino NoseBoswell, Nathan, MD  Biotin 5 MG TABS Take 5 mg by mouth daily with supper.    Yes [provider]  budesonide (PULMICORT) 0.25 MG/2ML nebulizer solution Take 0.25  mg by nebulization daily.    Yes [provider]  Cholecalciferol (VITAMIN D) 2000 units CAPS Take 2,000 Units by mouth daily.   Yes [provider]  DULoxetine (CYMBALTA) 30 MG capsule Take 30 mg by mouth daily.   Yes [provider]  Flaxseed, Linseed, (FLAXSEED OIL) 1000 MG CAPS Take 1,000 mg by mouth 3 (three) times daily.   Yes [provider]  fluticasone (FLONASE) 50 MCG/ACT nasal spray Place 1 spray into both nostrils daily as needed for allergies or rhinitis.   Yes [provider]  gabapentin (NEURONTIN) 100 MG capsule Take 1 capsule (100 mg total) by mouth every morning. Patient taking differently: Take 100-200 mg by mouth See admin instructions. 100mg  in  am, 200mg  in pm 08/01/16  Yes Valentino Nose, MD  latanoprost (XALATAN) 0.005 % ophthalmic solution Place 1 drop into both eyes at bedtime.   Yes [provider]  levalbuterol (XOPENEX HFA) 45 MCG/ACT inhaler Inhale 2 puffs into the lungs every 4 (four) hours as needed for wheezing.   Yes [provider]  loratadine (CLARITIN) 10 MG tablet Take 10 mg by mouth daily.    Yes [provider]  Multiple Vitamin (MULTIVITAMIN WITH MINERALS) TABS tablet Take 1 tablet by mouth daily.   Yes [provider]  Polyethyl Glycol-Propyl Glycol (SYSTANE ULTRA OP) Place 1 drop into both eyes 2 (two) times daily as needed (dry eyes).   Yes [provider]  senna-docusate (SENOKOT-S) 8.6-50 MG tablet Take 1 tablet by mouth at bedtime. Available over the counter Patient taking differently: Take 2 tablets by mouth at bedtime. Available over the counter 02/07/17  Yes Love, Evlyn Kanner, PA-C  sodium chloride (OCEAN) 0.65 % SOLN nasal spray Place 1 spray into both nostrils daily as needed for congestion.   Yes [provider]  ticagrelor (BRILINTA) 90 MG TABS tablet Take 1 tablet (90 mg total) by mouth 2 (two) times daily. 02/07/17  Yes Love, Evlyn Kanner, PA-C  traMADol (ULTRAM) 50 MG tablet Take 0.5 tablets (25 mg total) by mouth 2 (two) times daily as needed (pain). 02/07/17  Yes Love, Evlyn Kanner, PA-C    Family History Family History  Problem Relation Age of Onset  . Dementia Unknown   . Hypertension Daughter     Social History Social History   Tobacco Use  . Smoking status: Former Smoker    Packs/day: 0.25    Years: 23.00    Pack years: 5.75    Last attempt to quit: 06/05/1973    Years since quitting: 43.8  . Smokeless tobacco: Never Used  Substance Use Topics  . Alcohol use: No    Alcohol/week: 0.0 oz  . Drug use: No     Allergies   Atacand hct [candesartan cilexetil-hctz]; Carbamazepine; Erythromycin; Glucophage [metformin hcl]; Lactose intolerance (gi);  Lipitor [atorvastatin]; Nsaids; Penicillins; Sulfa antibiotics; Ceclor [cefaclor]; Clindamycin/lincomycin; Milk-related compounds; Rosuvastatin calcium; Dilaudid [hydromorphone hcl]; and Plavix [clopidogrel]   Review of Systems Review of Systems  Gastrointestinal: Positive for nausea and vomiting.  All other systems reviewed and are negative.    Physical Exam Updated Vital Signs BP 129/70   Pulse 98   Temp (!) 97.4 F (36.3 C)   Resp (!) 22   SpO2 98%   Physical Exam  Constitutional: She is oriented to person, place, and time. She appears well-developed and well-nourished. No distress.  HENT:  Head: Normocephalic and atraumatic.  Right Ear: Hearing normal.  Left Ear: Hearing normal.  Nose: Nose  normal.  Mouth/Throat: Oropharynx is clear and moist and mucous membranes are normal.  Eyes: Conjunctivae and EOM are normal. Pupils are equal, round, and reactive to light.  Neck: Normal range of motion. Neck supple.  Cardiovascular: Regular rhythm, S1 normal and S2 normal. Exam reveals no gallop and no friction rub.  No murmur heard. Pulmonary/Chest: Effort normal and breath sounds normal. No respiratory distress. She exhibits no tenderness.  Abdominal: Soft. Normal appearance and bowel sounds are normal. There is no hepatosplenomegaly. There is no tenderness. There is no rebound, no guarding, no tenderness at McBurney's point and negative Murphy's sign. No hernia.  Musculoskeletal: Normal range of motion.  Neurological: She is alert and oriented to person, place, and time. She has normal strength. No cranial nerve deficit or sensory deficit. Coordination normal. GCS eye subscore is 4. GCS verbal subscore is 5. GCS motor subscore is 6.  Skin: Skin is warm, dry and intact. No rash noted. No cyanosis.  Psychiatric: She has a normal mood and affect. Her speech is normal and behavior is normal. Thought content normal.  Nursing note and vitals reviewed.    ED Treatments / Results   Labs (all labs ordered are listed, but only abnormal results are displayed) Labs Reviewed  CBC WITH DIFFERENTIAL/PLATELET - Abnormal; Notable for the following components:      Result Value   WBC 14.3 (*)    Neutro Abs 11.6 (*)    All other components within normal limits  COMPREHENSIVE METABOLIC PANEL - Abnormal; Notable for the following components:   Sodium 133 (*)    Chloride 100 (*)    Glucose, Bld 238 (*)    BUN 35 (*)    Creatinine, Ser 1.25 (*)    Total Protein 6.4 (*)    Albumin 2.9 (*)    GFR calc non Af Amer 36 (*)    GFR calc Af Amer 42 (*)    All other components within normal limits  I-STAT CG4 LACTIC ACID, ED - Abnormal; Notable for the following components:   Lactic Acid, Venous 2.01 (*)    All other components within normal limits  POC OCCULT BLOOD, ED - Abnormal; Notable for the following components:   Fecal Occult Bld POSITIVE (*)    All other components within normal limits  LIPASE, BLOOD  PROTIME-INR  I-STAT TROPONIN, ED  TYPE AND SCREEN    EKG  EKG Interpretation  Date/Time:  Monday April 09 2017 05:43:52 EST Ventricular Rate:  95 PR Interval:    QRS Duration: 89 QT Interval:  377 QTC Calculation: 474 R Axis:   34 Text Interpretation:  Sinus rhythm Borderline T abnormalities, lateral leads Baseline wander in lead(s) V1 V5 Confirmed by Gilda Crease [phone]) on 04/09/2017 6:04:03 AM Also confirmed by Gilda Crease [phone]), editor Elita Quick ([id number])  on 04/09/2017 6:48:29 AM       Radiology No results found.  Procedures Procedures (including critical care time)  Medications Ordered in ED Medications  pantoprazole (PROTONIX) 80 mg in sodium chloride 0.9 % 100 mL IVPB (not administered)  pantoprazole (PROTONIX) 80 mg in sodium chloride 0.9 % 250 mL (0.32 mg/mL) infusion (not administered)  pantoprazole (PROTONIX) injection 40 mg (not administered)  ondansetron (ZOFRAN) injection 4 mg (4 mg Intravenous Given  04/09/17 0600)     Initial Impression / Assessment and Plan / ED Course  I have reviewed the triage vital signs and the nursing notes.  Pertinent labs & imaging results that were available during my  care of the patient were reviewed by me and considered in my medical decision making (see chart for details).     Patient presents to the emergency department for evaluation of coffee-ground emesis, generalized weakness.  Symptoms began several hours ago.  She has not had vomiting here in the ER, however, rectal exam was heme positive.  She is not anemic but is tachycardic.  I discussed this with the patient's daughter who is in [place], she informs me that she was just on a course of prednisone.  Must consider the possibility of gastric ulcer.  As the patient is on Brilinta, will recommend observation for serial hemoglobin checks and will ultimately need EGD.  Final Clinical Impressions(s) / ED Diagnoses   Final diagnoses:  Upper GI bleed    ED Discharge Orders    None       Gilda Crease, MD 04/09/17 [phone]

## 2017-04-09 NOTE — ED Notes (Signed)
Pt was being assisted to bedside commode when she had a syncopal episode for approx 10 seconds.  Pt was assisted to bed and was immediately ao x 4, though diaphoretic with hr of 125 and bp of 99/46.  Dr Allena KatzPatel at bedside and L bolus initiated.

## 2017-04-09 NOTE — Progress Notes (Signed)
Pt HR sustaining in 130s. Pt denies specific pain but reports weakness. MD made aware. Temp 99.8

## 2017-04-09 NOTE — Plan of Care (Signed)
Pt aware of need for fluids and to monitor hgb. MD aware of HR in 130s

## 2017-04-09 NOTE — ED Notes (Signed)
Admitting MD at bedside.

## 2017-04-09 NOTE — ED Notes (Signed)
Per Steward DroneBrenda, RN pt was able to use bedside toilet, stood pt up to use bedside toilet with one assist. This tech was fixing pt's bed when pt then yelled out, "help me, help me, help me!" Pt started looking off and having seizure like activity. Steward DroneBrenda, RN came in to assess pt. We were able to get pt back in bed and doctor came in. Pt's CBG 238, RN was notified.

## 2017-04-10 DIAGNOSIS — R710 Precipitous drop in hematocrit: Secondary | ICD-10-CM

## 2017-04-10 DIAGNOSIS — G8929 Other chronic pain: Secondary | ICD-10-CM | POA: Diagnosis not present

## 2017-04-10 DIAGNOSIS — Z87891 Personal history of nicotine dependence: Secondary | ICD-10-CM | POA: Diagnosis not present

## 2017-04-10 DIAGNOSIS — Z888 Allergy status to other drugs, medicaments and biological substances status: Secondary | ICD-10-CM | POA: Diagnosis not present

## 2017-04-10 DIAGNOSIS — Z885 Allergy status to narcotic agent status: Secondary | ICD-10-CM | POA: Diagnosis not present

## 2017-04-10 DIAGNOSIS — Z880 Allergy status to penicillin: Secondary | ICD-10-CM | POA: Diagnosis not present

## 2017-04-10 DIAGNOSIS — Z7189 Other specified counseling: Secondary | ICD-10-CM | POA: Diagnosis not present

## 2017-04-10 DIAGNOSIS — Z8249 Family history of ischemic heart disease and other diseases of the circulatory system: Secondary | ICD-10-CM | POA: Diagnosis not present

## 2017-04-10 DIAGNOSIS — Z79891 Long term (current) use of opiate analgesic: Secondary | ICD-10-CM | POA: Diagnosis not present

## 2017-04-10 DIAGNOSIS — J45909 Unspecified asthma, uncomplicated: Secondary | ICD-10-CM | POA: Diagnosis not present

## 2017-04-10 DIAGNOSIS — Z7902 Long term (current) use of antithrombotics/antiplatelets: Secondary | ICD-10-CM

## 2017-04-10 DIAGNOSIS — K922 Gastrointestinal hemorrhage, unspecified: Secondary | ICD-10-CM | POA: Diagnosis not present

## 2017-04-10 DIAGNOSIS — E1151 Type 2 diabetes mellitus with diabetic peripheral angiopathy without gangrene: Secondary | ICD-10-CM | POA: Diagnosis present

## 2017-04-10 DIAGNOSIS — G43909 Migraine, unspecified, not intractable, without status migrainosus: Secondary | ICD-10-CM | POA: Diagnosis present

## 2017-04-10 DIAGNOSIS — Z8673 Personal history of transient ischemic attack (TIA), and cerebral infarction without residual deficits: Secondary | ICD-10-CM

## 2017-04-10 DIAGNOSIS — E785 Hyperlipidemia, unspecified: Secondary | ICD-10-CM | POA: Diagnosis present

## 2017-04-10 DIAGNOSIS — Z515 Encounter for palliative care: Secondary | ICD-10-CM

## 2017-04-10 DIAGNOSIS — E119 Type 2 diabetes mellitus without complications: Secondary | ICD-10-CM

## 2017-04-10 DIAGNOSIS — Z89611 Acquired absence of right leg above knee: Secondary | ICD-10-CM | POA: Diagnosis not present

## 2017-04-10 DIAGNOSIS — H409 Unspecified glaucoma: Secondary | ICD-10-CM | POA: Diagnosis present

## 2017-04-10 DIAGNOSIS — Z7951 Long term (current) use of inhaled steroids: Secondary | ICD-10-CM | POA: Diagnosis not present

## 2017-04-10 DIAGNOSIS — Z9889 Other specified postprocedural states: Secondary | ICD-10-CM

## 2017-04-10 DIAGNOSIS — Z882 Allergy status to sulfonamides status: Secondary | ICD-10-CM | POA: Diagnosis not present

## 2017-04-10 DIAGNOSIS — D62 Acute posthemorrhagic anemia: Secondary | ICD-10-CM | POA: Diagnosis present

## 2017-04-10 DIAGNOSIS — D696 Thrombocytopenia, unspecified: Secondary | ICD-10-CM

## 2017-04-10 DIAGNOSIS — Z7982 Long term (current) use of aspirin: Secondary | ICD-10-CM

## 2017-04-10 DIAGNOSIS — J4530 Mild persistent asthma, uncomplicated: Secondary | ICD-10-CM | POA: Diagnosis present

## 2017-04-10 DIAGNOSIS — K920 Hematemesis: Secondary | ICD-10-CM | POA: Diagnosis present

## 2017-04-10 DIAGNOSIS — I129 Hypertensive chronic kidney disease with stage 1 through stage 4 chronic kidney disease, or unspecified chronic kidney disease: Secondary | ICD-10-CM | POA: Diagnosis present

## 2017-04-10 DIAGNOSIS — E1122 Type 2 diabetes mellitus with diabetic chronic kidney disease: Secondary | ICD-10-CM | POA: Diagnosis present

## 2017-04-10 DIAGNOSIS — I10 Essential (primary) hypertension: Secondary | ICD-10-CM | POA: Diagnosis not present

## 2017-04-10 DIAGNOSIS — Z881 Allergy status to other antibiotic agents status: Secondary | ICD-10-CM | POA: Diagnosis not present

## 2017-04-10 DIAGNOSIS — D72829 Elevated white blood cell count, unspecified: Secondary | ICD-10-CM | POA: Diagnosis not present

## 2017-04-10 DIAGNOSIS — Z23 Encounter for immunization: Secondary | ICD-10-CM | POA: Diagnosis present

## 2017-04-10 DIAGNOSIS — N183 Chronic kidney disease, stage 3 (moderate): Secondary | ICD-10-CM | POA: Diagnosis present

## 2017-04-10 DIAGNOSIS — Z79899 Other long term (current) drug therapy: Secondary | ICD-10-CM | POA: Diagnosis not present

## 2017-04-10 DIAGNOSIS — Z886 Allergy status to analgesic agent status: Secondary | ICD-10-CM | POA: Diagnosis not present

## 2017-04-10 DIAGNOSIS — M5030 Other cervical disc degeneration, unspecified cervical region: Secondary | ICD-10-CM | POA: Diagnosis present

## 2017-04-10 LAB — CBC
HCT: 31.1 % — ABNORMAL LOW (ref 36.0–46.0)
HCT: 28.3 % — ABNORMAL LOW (ref 36.0–46.0)
HEMATOCRIT: 29.2 % — AB (ref 36.0–46.0)
HEMOGLOBIN: 9.4 g/dL — AB (ref 12.0–15.0)
Hemoglobin: 10.6 g/dL — ABNORMAL LOW (ref 12.0–15.0)
Hemoglobin: 9.5 g/dL — ABNORMAL LOW (ref 12.0–15.0)
MCH: 30 pg (ref 26.0–34.0)
MCH: 29.7 pg (ref 26.0–34.0)
MCH: 28.7 pg (ref 26.0–34.0)
MCHC: 34.1 g/dL (ref 30.0–36.0)
MCHC: 33.6 g/dL (ref 30.0–36.0)
MCHC: 32.2 g/dL (ref 30.0–36.0)
MCV: 88.1 fL (ref 78.0–100.0)
MCV: 88.4 fL (ref 78.0–100.0)
MCV: 89 fL (ref 78.0–100.0)
PLATELETS: 114 10*3/uL — AB (ref 150–400)
Platelets: 111 10*3/uL — ABNORMAL LOW (ref 150–400)
Platelets: 118 10*3/uL — ABNORMAL LOW (ref 150–400)
RBC: 3.53 MIL/uL — AB (ref 3.87–5.11)
RBC: 3.2 MIL/uL — ABNORMAL LOW (ref 3.87–5.11)
RBC: 3.28 MIL/uL — AB (ref 3.87–5.11)
RDW: 13.3 % (ref 11.5–15.5)
RDW: 13.1 % (ref 11.5–15.5)
RDW: 13.3 % (ref 11.5–15.5)
WBC: 27 10*3/uL — ABNORMAL HIGH (ref 4.0–10.5)
WBC: 21.2 10*3/uL — AB (ref 4.0–10.5)
WBC: 20.4 10*3/uL — ABNORMAL HIGH (ref 4.0–10.5)

## 2017-04-10 LAB — BASIC METABOLIC PANEL
Anion gap: 6 (ref 5–15)
BUN: 28 mg/dL — ABNORMAL HIGH (ref 6–20)
CALCIUM: 8.8 mg/dL — AB (ref 8.9–10.3)
CO2: 19 mmol/L — AB (ref 22–32)
Chloride: 113 mmol/L — ABNORMAL HIGH (ref 101–111)
Creatinine, Ser: 1.14 mg/dL — ABNORMAL HIGH (ref 0.44–1.00)
GFR calc Af Amer: 47 mL/min — ABNORMAL LOW (ref >60)
GFR calc non Af Amer: 40 mL/min — ABNORMAL LOW (ref >60)
GLUCOSE: 105 mg/dL — AB (ref 65–99)
Potassium: 4.3 mmol/L (ref 3.5–5.1)
Sodium: 138 mmol/L (ref 135–145)

## 2017-04-10 LAB — GLUCOSE, CAPILLARY
GLUCOSE-CAPILLARY: 101 mg/dL — AB (ref 65–99)
GLUCOSE-CAPILLARY: 88 mg/dL (ref 65–99)
GLUCOSE-CAPILLARY: 110 mg/dL — AB (ref 65–99)
Glucose-Capillary: 113 mg/dL — ABNORMAL HIGH (ref 65–99)

## 2017-04-10 MED ORDER — PANTOPRAZOLE SODIUM 40 MG PO TBEC
40.0000 mg | DELAYED_RELEASE_TABLET | Freq: Two times a day (BID) | ORAL | Status: DC
  Administered 2017-04-10 – 2017-04-13 (×6): 40 mg via ORAL
  Filled 2017-04-10 (×7): qty 1

## 2017-04-10 MED ORDER — FOOD THICKENER (SIMPLYTHICK)
1.0000 | Freq: Three times a day (TID) | ORAL | Status: DC
  Administered 2017-04-10 – 2017-04-11 (×3): 1 via ORAL
  Filled 2017-04-10 (×13): qty 1

## 2017-04-10 MED ORDER — SODIUM CHLORIDE 0.9 % IV SOLN
Freq: Once | INTRAVENOUS | Status: AC
  Administered 2017-04-10: via INTRAVENOUS

## 2017-04-10 NOTE — Progress Notes (Signed)
Subjective: Fevers and tachycardia and diminished mentation overnight. No reported hematemesis and no melena (no bowel movement since admission).  Objective: Vital signs in last 24 hours: Temp:  [97.7 F (36.5 C)-99.8 F (37.7 C)] 98.4 F (36.9 C) (11/06 0900) Pulse Rate:  [90-124] 90 (11/06 0431) Resp:  [16-18] 16 (11/06 0900) BP: (96-143)/(53-73) 132/63 (11/06 0900) SpO2:  [96 %-100 %] 100 % (11/06 0900) Weight change:  Last BM Date: 04/06/17  PE: GEN:  Elderly, somnolent (arousable but doesn't answer questions) ABD:  Soft, non-tender  Lab Results: CBC    Component Value Date/Time   WBC 27.0 (H) 04/10/2017 0718   RBC 3.53 (L) 04/10/2017 0718   HGB 10.6 (L) 04/10/2017 0718   HCT 31.1 (L) 04/10/2017 0718   PLT PENDING 04/10/2017 0718   MCV 88.1 04/10/2017 0718   MCH 30.0 04/10/2017 0718   MCHC 34.1 04/10/2017 0718   RDW 13.3 04/10/2017 0718   LYMPHSABS 1.8 04/09/2017 0557   MONOABS 0.8 04/09/2017 0557   EOSABS 0.1 04/09/2017 0557   BASOSABS 0.0 04/09/2017 0557   CMP     Component Value Date/Time   NA 138 04/10/2017 0718   K 4.3 04/10/2017 0718   CL 113 (H) 04/10/2017 0718   CO2 19 (L) 04/10/2017 0718   GLUCOSE 105 (H) 04/10/2017 0718   BUN 28 (H) 04/10/2017 0718   CREATININE 1.14 (H) 04/10/2017 0718   CALCIUM 8.8 (L) 04/10/2017 0718   PROT 6.4 (L) 04/09/2017 0557   ALBUMIN 2.9 (L) 04/09/2017 0557   AST 25 04/09/2017 0557   ALT 16 04/09/2017 0557   ALKPHOS 87 04/09/2017 0557   BILITOT 1.0 04/09/2017 0557   GFRNONAA 40 (L) 04/10/2017 0718   GFRAA 47 (L) 04/10/2017 0718   Assessment:  1.  Reported hematemesis.  No evidence of acute GI bleed at this time. 2.  New onset fevers, tachycardia, leukocytosis. 3.  Anemia, slightly worse overnight.  Possible patient could have had some type of as yet determined infection versus non-luminal bleed (intracranial, retroperitoneal) which could cause decrease in Hgb in setting of anticoagulation.   Plan:  1.  No  evidence of overt GI tract bleeding; both daughter and I don't feel there is compelling need for endoscopy at the present time. 2.  Hold anticoagulation.  Continue PPI and serial cbcs and volume repletion as needed. 3.  Defer to PCP regarding fever/leukocytosis work-up.  4.  Do not plan on pursuing endoscopic work-up. 5.  [facility] will sign-off; please call with questions; thank you for the consultation.  Carolyn JakschOUTLAW,Carolyn Baisch M 04/10/2017, 9:56 AM   Cell [phone] If no answer or after 5 PM call [phone]

## 2017-04-10 NOTE — Progress Notes (Signed)
   Subjective:  Hgb downtrending (9.7) overnight and required 1 unit of pRBCs.  She was hemodynamically stable at the time. This morning patient states she is feeling better but still feeling tired and sleepy when seen.  Daughter at bedside.  Discussed continuing aspirin and Brilinta after resolution of GI bleed.  Daughter would prefer continuing aspirin only at this time, but will discuss with family.  Per daughter, there is an ongoing discussion with patient's PCP and neurology on whether to continue Brilinta or not at this time.  Per daughter, [facility]  She would like to discuss further options and GOC with palliative care.  Objective:  Vital signs in last 24 hours: Vitals:   04/09/17 2340 04/10/17 0014 04/10/17 0245 04/10/17 0431  BP: 116/73 (!) 124/53 123/60 (!) 96/57  Pulse: 100 98 92 90  Resp: 18 17 18 16   Temp: 98.5 F (36.9 C) 98.1 F (36.7 C) 99 F (37.2 C) 97.7 F (36.5 C)  TempSrc: Oral Oral Oral Oral  SpO2: 99% 98% 100% 97%   General: very pleasant female, appears chronically ill, well-developed, lying in bed somnolent but in no acute distress Cardiac: regular rate and rhythm, nl S1/S2, no murmurs, rubs or gallops  Pulm: CTAB, no wheezes or crackles, no increased work of breathing  Abd: soft, NTND, bowel sounds present  Neuro: A&Ox3, able to move all 4 extremities  Ext: warm and well perfused, no peripheral edema, s/p R AKA     Assessment/Plan:  83 year old female with PMH significant for at least 2 left sided CVAs (on DAPT with ASA/Brillinta), Left ICA Stenosis (80-99%), PVD s/p Right AKA, T2DM, HTN, HLD, and Asthma who presents with suspected acute upper GI bleed.  Upper GI Bleed: GI consulted. Will not pursue aggressive intervention given patient's AMS, age, and comorbidities. Conservative management preferred at this time. Daughter in agreement with plan. She required 1 unit pRBCs overnight due to persistent bleeding  with Hgb 9.7 from 10.6. Today patient states she is feeling much better than yesterday but continues to endorse tiredness and fatigue. Will continue conservative therapy.  - GI following, appreciate recommendations  - CBC now then BID if Hgb stable  -  NS @ 100 mL/hr - Switch Protonix gtt to p.o. Protonix 40 mg twice daily - Zofran prn - Continue to hold ASA and Brillinta - Palliative care consulted for GOC discussion given patient's declined in health over the past year -Speech therapy consulted for swallow evaluation his daughter reports intermittent choking episodes when swallowing.  # Hx of CVA: Patient with at least 2 left sided CVAs, most recently in August 2018 with a new left paramedian pontine stroke. She has been on DAPT with ASA 81 mg daily and Brillinta 90 mg BID since then.  - Holding home ASA and Brillinta in the setting of GI bleed  - Will need to discuss long-term risks/benefits for secondary prevention of stroke versus further bleeding risk. Per GI note, seems like daughter is unsure whether she would like to continue this after resolution of GI bleed.   # T2DM: Diet controlled at home. - SSI-S   Dispo: Anticipated discharge in approximately 1-2 day(s).   Burna CashIdalys Santos-Sanchez, MD  Internal Medicine PGY-1  P [phone]

## 2017-04-10 NOTE — Plan of Care (Signed)
  [facility] Additional goals pending.   Lockie Bothun B. Murvin NatalBueche, MSP, CCC-SLP Speech Language Pathologist

## 2017-04-10 NOTE — Plan of Care (Signed)
Pt understands need to continue to monitor hemoglobin, vitals, and WBC count. Reports feeling some better. Pt alert and oriented x3 and responsive to voice.

## 2017-04-10 NOTE — Consult Note (Signed)
Consultation Note Date: 04/10/2017   Patient Name: Carolyn Werner  DOB: 12-Mar-1922  MRN: [id number]  Age / Sex: 83 y.o., female  PCP: Sinclair Ship, MD Referring Physician: Bartholomew Crews, MD  Reason for Consultation: Establishing goals of care and Hospice Evaluation  HPI/Patient Profile: 83 y.o. female  with past medical history of PMH significant for at least 2 left sided CVAs both in 2018 last CVA August (on ASA/Brillinta), Left ICA Stenosis (80-99%), PVD s/p Right AKA, T2DM, HTN, HLD, and asthma admitted on 04/09/2017 with bloody emesis. Has also had issues with syncope and declining functional status over past year. Has required 2 units PRBC and has declined endoscopy.   Carolyn Werner says she "feels bad." Appears weakened and fatigued - has no specific complaints of pain or discomfort.   Clinical Assessment and Goals of Care:  I met today with Carolyn Werner's daughter, Carolyn Werner. 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 They have had many conversations regarding EOL. Carolyn Werner believes that her mother is ready when her time comes for death. They are very spiritual people and find strength and comfort in their faith. Discussed hospice options to follow at home, nursing facility, as well as hospice facility (usually < 2 weeks prognosis). I do believe Carolyn Werner qualifies for hospice care. I am unsure if she qualifies for hospice facility (if appetite remains severely poor she may qualify). For now Carolyn Werner is hopeful for transition to [facility] with hospice to follow in [place]. This would be convenient for Carolyn Werner to return to her home and life in [place] but also be close with her mother.   Goals are clear for focus on comfort and QOL. For now they desire repeat blood transfusion as needed and  supportive care. Desire for DNR confirmed with Carolyn Werner as well as Carolyn Werner. No invasive/aggressive diagnostics or interventions desired. They both recognize that her time is likely limited and seem to have peace with EOL. CSW consulted to assist with discussions with [place] regarding disposition options. We will continue to monitor intake and progress for hospice inpatient eligibility.   Primary Decision Maker PATIENT, HCPOA daughter Carolyn Werner OF RECOMMENDATIONS   - DNR (confirmed with dtr and patient) - [facility] ([facility] with [facility] vs potentially inpt hospice if prognosis proves to poorer)  Code Status/Advance Care Planning:  DNR   Symptom Management:   SLP following for recent h/o "strangulation" with intake: Follow intake with recommended diet  Chronic arthritic pain controlled with Cymbalta, gabapentin, and tramadol  Adjusted medications per dtr request: budesonide BID, add Vit D, add latanoprost qhs. Also requested multivitamin but sounded like she takes renal vitamin??  Constipation: Dtr says she does really well with Miralax - added x 1 and daily prn.   Palliative Prophylaxis:   Aspiration, Bowel Regimen, Frequent Pain Assessment, Oral Care and Turn Reposition  Additional Recommendations (Limitations, Scope, Preferences):  Comfort focused care  Psycho-social/Spiritual:   Desire for further Chaplaincy support:yes  Additional Recommendations: Caregiving  Support/Resources, Education on Hospice and Grief/Bereavement Support  Prognosis:   < 6 months very likely (possibly less - TBD)  Discharge Planning: Ideally to [facility] with hospice to follow     Primary Diagnoses: Present on Admission: . Acute upper GI bleed . CKD (chronic kidney disease) stage 3, GFR 30-59 ml/min ([name])   I have reviewed the medical record, interviewed the patient and family, and examined the patient. The following aspects  are pertinent.  Past Medical History:  Diagnosis Date  . Abdominal hernia   . Cataracts, both eyes   . CKD (chronic kidney disease), stage III ([place])   . Complication of anesthesia    CONFUSION   . DDD (degenerative disc disease), cervical   . Diabetes mellitus without complication ([place])   . Headache   . Hx of benign neoplasm of pituitary gland   . IBS (irritable colon syndrome)-C   . Mild persistent asthma   . PVD (peripheral vascular disease) ([place])   . Shingles   . Stroke [facility])    Social History   Socioeconomic History  . Marital status: Widowed    Spouse name: None  . Number of children: 3  . Years of education: None  . Highest education level: None  Social Needs  . Financial resource strain: None  . Food insecurity - worry: None  . Food insecurity - inability: None  . Transportation needs - medical: None  . Transportation needs - non-medical: None  Occupational History  . None  Tobacco Use  . Smoking status: Former Smoker    Packs/day: 0.25    Years: 23.00    Pack years: 5.75    Last attempt to quit: 06/05/1973    Years since quitting: 43.8  . Smokeless tobacco: Never Used  Substance and Sexual Activity  . Alcohol use: No    Alcohol/week: 0.0 oz  . Drug use: No  . Sexual activity: No  Other Topics Concern  . None  Social History Narrative   Lives at home w/ her daughter. Accepted to [facility] in [place], [place]; on waitlist pending bed availability.   Right-handed   Caffeine: decaf coffee   Family History  Problem Relation Age of Onset  . Dementia Unknown   . Hypertension Daughter    Scheduled Meds: . budesonide  0.25 mg Nebulization Daily  . DULoxetine  30 mg Oral Daily  . gabapentin  100 mg Oral Daily   And  . gabapentin  200 mg Oral QHS  . insulin aspart  0-9 Units Subcutaneous TID WC  . pantoprazole  40 mg Oral BID   Continuous Infusions: PRN Meds:.acetaminophen **OR** acetaminophen, levalbuterol, ondansetron **OR** ondansetron (ZOFRAN)  IV, senna-docusate, traMADol Allergies  Allergen Reactions  . Atacand Hct [Candesartan Cilexetil-Hctz] Other (See Comments)    Kidney disease  . Carbamazepine Other (See Comments)    Changes in ability to read, think clearly, unsteady gait  . Erythromycin Diarrhea  . Glucophage [Metformin Hcl] Other (See Comments)    Chronic low blood sugar   . Lactose Intolerance (Gi) Diarrhea and Nausea And Vomiting    Nausea & Vomiting; Diarrhea  . Lipitor [Atorvastatin] Other (See Comments)    Muscle weakness  . Nsaids Nausea And Vomiting    Motrin, Naprosyn, Voltaren, Celebrex, Prevacid, Mobic -- abdominal pain and burning, cramps, loose stool  . Penicillins Shortness Of Breath and Rash    Has patient  had a PCN reaction causing immediate rash, facial/tongue/throat swelling, SOB or lightheadedness with hypotension: Yes Has patient had a PCN reaction causing severe rash involving mucus membranes or skin necrosis: No Has patient had a PCN reaction that required hospitalization: No Has patient had a PCN reaction occurring within the last 10 years: No If all of the above answers are "NO", then may proceed with Cephalosporin use.  . Sulfa Antibiotics Anaphylaxis  . Ceclor [Cefaclor] Hives  . Clindamycin/Lincomycin Other (See Comments)    Abdominal pain, loss of appetite  . Milk-Related Compounds Other (See Comments)    Bloating, abdominal discomfort.  . Rosuvastatin Calcium Other (See Comments)    Lethargy, malaise  . Dilaudid [Hydromorphone Hcl] Rash  . Plavix [Clopidogrel] Rash and Other (See Comments)    Skin peeled, hair fell out   Review of Systems  Constitutional: Positive for activity change, appetite change and fatigue.  Gastrointestinal: Positive for constipation.  Neurological: Positive for weakness.    Physical Exam  Constitutional: She is oriented to person, place, and time. She appears well-developed. She appears ill.  Elderly, frail  HENT:  Head: Normocephalic and atraumatic.   Cardiovascular: Normal rate and regular rhythm.  Pulmonary/Chest: Effort normal. No accessory muscle usage. No tachypnea. No respiratory distress.  Abdominal: Soft. Normal appearance. She exhibits no distension. There is no tenderness.  Neurological: She is alert and oriented to person, place, and time.  Nursing note and vitals reviewed.   Vital Signs: BP (!) 124/59 (BP Location: Right Arm)   Pulse 91   Temp 98.5 F (36.9 C) (Oral)   Resp (!) 22   SpO2 98%  Pain Assessment: 0-10   Pain Score: 4    SpO2: SpO2: 98 % O2 Device:SpO2: 98 % O2 Flow Rate: .   IO: Intake/output summary:   Intake/Output Summary (Last 24 hours) at 04/10/2017 1453 Last data filed at 04/10/2017 0900 Gross per 24 hour  Intake 2943.25 ml  Output 450 ml  Net 2493.25 ml    LBM: Last BM Date: 04/09/17 Baseline Weight:   Most recent weight:       Palliative Assessment/Data: 30%     Time In: 1000 Time Out: 1230 Time Total: 150 min Greater than 50%  of this time was spent counseling and coordinating care related to the above assessment and plan.  Signed by: Vinie Sill, NP Palliative Medicine Team Pager # [phone] (M-F 8a-5p) Team Phone # [phone] (Nights/Weekends)

## 2017-04-10 NOTE — Progress Notes (Signed)
Date: 04/10/2017  Patient name: Carolyn Werner  Medical record number: [id number]  Date of birth: 07/18/1921   [facility] and discussed their care with the Residency Team. [name] is a 83 year old woman with history of 2 CVAs, the most recent in August 2018 due to left paramedian pons infarct [facility]. She was sent out on aspirin and brillinta. She presented to the ED via EMS after waking up at 3 AM on the day of admission with nausea, weakness, and coffee ground emesis. Her daughter is with her and contributes to the history. After admission, her hemoglobin dropped about 2 units and she was prophylactically transfused 1 unit of packed red blood cells. This morning, she remains hemodynamically stable, tired, and fatigued.  GI has evaluated the patient and decided with the daughter's input that the patient was not a candidate for any endoscopy. We will be treating her symptomatically with PPIs, IV fluids, and transfusions as needed. The daughter and her family members have had ongoing discussions regarding their mother's care, needs, and plans. [facility] The daughter is concerned that she will need more care than the family can give at home. The daughter is open to having more in-depth discussions, including if her mother is appropriate for hospice, while the patient is in the hospital.  Soc Hx : the pt is a retired psychiatric nurse  Vitals:   04/10/17 1005 04/10/17 1153  BP:  (!) 124/59  Pulse:  91  Resp:  (!) 22  Temp:  98.5 F (36.9 C)  SpO2: 95% 98%  T max 99.8 HR 91 RR 22 BP 124/99 Gen :: lying in bed, NAD, appears fatigued and falls asleep easily,  HRRR no MRG Ext no edema ABD + BS, soft, NT  Cr 1.25 - 1.14 baseline Co2 23 - 19 Gap 6 LA 2 WBC 22.6 - 21.2 plts 114 HgB 12.6 - 9.7 - 1 unit - 10.6 - 9.5  I personally viewed the CXR images and confirmed my reading  with the official read. 2 view AP and lateral : rotated, no sig abnl  I personally viewed the EKG and confirmed my reading with the official read. Sinus, nl axis, rr' pattern in inf leads, unchanged  Assessment and Plan: I have seen and evaluated the patient as outlined above. I agree with the formulated Assessment and Plan as detailed in the residents' note, with the following changes: [name] is a 83 year old woman with chronic medical issues and declining recent health. She presents with a upper GI bleed while on both aspirin and brillinta for tertiary prevention of stroke. She is not a candidate for endoscopy so she will be treated symptomatically with PPIs, IV fluids, and transfusions if her hemoglobin drops. The daughter has a very good understanding that her mother's health is declining and the family has already had discussions about what next. She is in agreement for more in-depth discussions with our palliative care team.  1. Upper GI bleed - follow HgB and transfuse if HgB drops. Cont PPI but change to PO once she is able to swallow pills. Hold anti-plt agents.    2. H/O CVA - after her last stroke, she was discharged on dual antiplatelet agents. The patient, the patient's daughter, the patient's PCP, and neurology were all in discussions whether she needed to continue dual antiplatelets, the final decision today, which that the patient would be happy  with just aspirin as she has always thought that did quite well. Once her bleeding has resolved, we will resume aspirin 81 mg.  3. Thrombocytopenia - this is new this admission. She is not on any heparin products so HIT is unlikely. Not all cell lines are decreasing so dilutional from IV fluids is unlikely. She is not on any medication on to frequently cause thrombocytopenia. If her platelets continue to follow, which start with a peripheral blood smear to assess for platelet clumping or other abnl.  4. Decline in health status -  palliative care consult for GOC and options.  Burns SpainButcher, Elizabeth A, MD 11/6/20184:27 PM

## 2017-04-10 NOTE — Evaluation (Signed)
Clinical/Bedside Swallow Evaluation Patient Details  Name: Carolyn Werner MRN: [id number] Date of Birth: 11/14/1921  Today's Date: 04/10/2017 Time: SLP Start Time (ACUTE ONLY): 1512 SLP Stop Time (ACUTE ONLY): 1545 SLP Time Calculation (min) (ACUTE ONLY): 33 min  Past Medical History:  Past Medical History:  Diagnosis Date  . Abdominal hernia   . Cataracts, both eyes   . CKD (chronic kidney disease), stage III ([name])   . Complication of anesthesia    CONFUSION   . DDD (degenerative disc disease), cervical   . Diabetes mellitus without complication ([name])   . Headache   . Hx of benign neoplasm of pituitary gland   . IBS (irritable colon syndrome)-C   . Mild persistent asthma   . PVD (peripheral vascular disease) ([name])   . Shingles   . Stroke [facility]([name])    Past Surgical History:  Past Surgical History:  Procedure Laterality Date  . ABOVE KNEE LEG AMPUTATION Right 2013  . APPENDECTOMY    . BYPASS GRAFT     ileofemoral   . PITUITARY EXCISION    . TONSILLECTOMY     HPI:  83 year old female admitted 04/09/17 after throwing up blood. PMH significant for DM, CVA. CXR - LLL scarring, no edema or consolidation. MRI - no acute findings, multiple old cerebellar infarcts and old small left occipital lobe infarct. No evidence of GI bleed per GI.   Assessment / Plan / Recommendation Clinical Impression  Pt with advanced age and history of CVAs, reports she gets strangled "so much more easily" since last CVA. Baseline right weakness noted. Pt accepted trials of thin, nectar thick liquid, puree, and solids. Immediate congested, nonproductive cough followed thin liquid trials. Nectar thick liquid, puree, and solid appeared to be tolerated well, however, pt did exhibit extended oral prep of solid. She also indicated her daughter cuts her food up for her at home. Given increased difficulty since last CVA, as well as congested cough following thin liquid, will proceed with MBS to objectively assess  swallow function and safety, and identify least restrictive diet. In the interim, will change diet to dys 2 (fine chop) and nectar thick liquid.  Safe swallow precautions reviewed and posted. RN informed of results and recommendations.    SLP Visit Diagnosis: Dysphagia, unspecified (R13.10)    Aspiration Risk  Moderate aspiration risk;Mild aspiration risk    Diet Recommendation Dysphagia 2 (Fine chop);Nectar-thick liquid   Liquid Administration via: Cup;Straw Medication Administration: Whole meds with liquid Supervision: Patient able to self feed;Staff to assist with self feeding Compensations: Minimize environmental distractions;Slow rate;Small sips/bites Postural Changes: Seated upright at 90 degrees    Other  Recommendations Oral Care Recommendations: Oral care BID Other Recommendations: Order thickener from pharmacy   Follow up Recommendations   pending MBS results     Frequency and Duration   pending MBS results         Prognosis Prognosis for Safe Diet Advancement: Fair Barriers to Reach Goals: (advanced age, prior CVAs)      Swallow Study   General Date of Onset: 04/09/17 HPI: 83 year old female admitted 04/09/17 after throwing up blood. PMH significant for DM, CVA. CXR - LLL scarring, no edema or consolidation. MRI - no acute findings, multiple old cerebellar infarcts and old small left occipital lobe infarct. No evidence of GI bleed per GI. Type of Study: Bedside Swallow Evaluation Previous Swallow Assessment: 07/28/16 BSE - reg/thin, no follow up Diet Prior to this Study: Other (Comment)(clear liquid) Temperature Spikes Noted:  No Respiratory Status: Room air History of Recent Intubation: No Behavior/Cognition: Alert;Cooperative;Pleasant mood Oral Cavity Assessment: Within Functional Limits Oral Care Completed by SLP: No Oral Cavity - Dentition: Missing dentition Vision: Functional for self-feeding Self-Feeding Abilities: Needs assist;Needs set up Patient  Positioning: Upright in bed Baseline Vocal Quality: Normal Volitional Cough: Strong Volitional Swallow: Able to elicit    Oral/Motor/Sensory Function Overall Oral Motor/Sensory Function: Mild impairment(right weakness)   Ice Chips Ice chips: Not tested   Thin Liquid Thin Liquid: Impaired Presentation: Cup;Straw Pharyngeal  Phase Impairments: Cough - Immediate    Nectar Thick Nectar Thick Liquid: Within functional limits Presentation: Cup;Straw   Honey Thick Honey Thick Liquid: Not tested   Puree Puree: Within functional limits Presentation: Spoon   Solid   GO   Solid: Within functional limits    Functional Assessment Tool Used: asha noms, clinical judgment, BSE Functional Limitations: Swallowing Swallow Current Status ([id number](G8996): At least 40 percent but less than 60 percent impaired, limited or restricted Swallow Goal Status [phone]([id number]): At least 40 percent but less than 60 percent impaired, limited or restricted  Carolyn Werner B. Murvin NatalBueche, MSP, CCC-SLP Speech Language Pathologist  Carolyn AuroraBueche, Carolyn Werner 04/10/2017,3:53 PM

## 2017-04-11 ENCOUNTER — Inpatient Hospital Stay (HOSPITAL_COMMUNITY): Payer: Medicare Other

## 2017-04-11 DIAGNOSIS — Z79899 Other long term (current) drug therapy: Secondary | ICD-10-CM

## 2017-04-11 DIAGNOSIS — D72829 Elevated white blood cell count, unspecified: Secondary | ICD-10-CM

## 2017-04-11 DIAGNOSIS — G8929 Other chronic pain: Secondary | ICD-10-CM

## 2017-04-11 DIAGNOSIS — Z79891 Long term (current) use of opiate analgesic: Secondary | ICD-10-CM

## 2017-04-11 LAB — URINE CULTURE

## 2017-04-11 LAB — CBC
HCT: 32 % — ABNORMAL LOW (ref 36.0–46.0)
HEMATOCRIT: 28.1 % — AB (ref 36.0–46.0)
HEMATOCRIT: 32.3 % — AB (ref 36.0–46.0)
HEMOGLOBIN: 9.2 g/dL — AB (ref 12.0–15.0)
HEMOGLOBIN: 10.9 g/dL — AB (ref 12.0–15.0)
HEMOGLOBIN: 10.9 g/dL — AB (ref 12.0–15.0)
MCH: 29.4 pg (ref 26.0–34.0)
MCH: 29.9 pg (ref 26.0–34.0)
MCH: 30 pg (ref 26.0–34.0)
MCHC: 32.7 g/dL (ref 30.0–36.0)
MCHC: 33.7 g/dL (ref 30.0–36.0)
MCHC: 34.1 g/dL (ref 30.0–36.0)
MCV: 89.8 fL (ref 78.0–100.0)
MCV: 88.7 fL (ref 78.0–100.0)
MCV: 88.2 fL (ref 78.0–100.0)
Platelets: 129 10*3/uL — ABNORMAL LOW (ref 150–400)
Platelets: 127 10*3/uL — ABNORMAL LOW (ref 150–400)
Platelets: 116 10*3/uL — ABNORMAL LOW (ref 150–400)
RBC: 3.13 MIL/uL — ABNORMAL LOW (ref 3.87–5.11)
RBC: 3.64 MIL/uL — ABNORMAL LOW (ref 3.87–5.11)
RBC: 3.63 MIL/uL — AB (ref 3.87–5.11)
RDW: 13.4 % (ref 11.5–15.5)
RDW: 13.3 % (ref 11.5–15.5)
RDW: 13.5 % (ref 11.5–15.5)
WBC: 15.4 10*3/uL — AB (ref 4.0–10.5)
WBC: 14.4 10*3/uL — AB (ref 4.0–10.5)
WBC: 12.7 10*3/uL — ABNORMAL HIGH (ref 4.0–10.5)

## 2017-04-11 LAB — PREPARE RBC (CROSSMATCH)

## 2017-04-11 LAB — GLUCOSE, CAPILLARY
GLUCOSE-CAPILLARY: 100 mg/dL — AB (ref 65–99)
GLUCOSE-CAPILLARY: 119 mg/dL — AB (ref 65–99)
Glucose-Capillary: 80 mg/dL (ref 65–99)
Glucose-Capillary: 140 mg/dL — ABNORMAL HIGH (ref 65–99)
Glucose-Capillary: 70 mg/dL (ref 65–99)

## 2017-04-11 MED ORDER — POLYETHYLENE GLYCOL 3350 17 G PO PACK
17.0000 g | PACK | Freq: Once | ORAL | Status: AC
  Administered 2017-04-11: 17 g via ORAL
  Filled 2017-04-11: qty 1

## 2017-04-11 MED ORDER — SODIUM CHLORIDE 0.9 % IV SOLN
INTRAVENOUS | Status: AC
  Administered 2017-04-11 (×2): via INTRAVENOUS

## 2017-04-11 MED ORDER — LATANOPROST 0.005 % OP SOLN
1.0000 [drp] | Freq: Every day | OPHTHALMIC | Status: DC
  Administered 2017-04-11 – 2017-04-12 (×2): 1 [drp] via OPHTHALMIC
  Filled 2017-04-11 (×2): qty 2.5

## 2017-04-11 MED ORDER — SODIUM CHLORIDE 0.9 % IV SOLN
Freq: Once | INTRAVENOUS | Status: AC
  Administered 2017-04-11: 09:00:00 via INTRAVENOUS

## 2017-04-11 MED ORDER — VITAMIN D 1000 UNITS PO TABS
2000.0000 [IU] | ORAL_TABLET | Freq: Every day | ORAL | Status: DC
  Administered 2017-04-11 – 2017-04-13 (×3): 2000 [IU] via ORAL
  Filled 2017-04-11 (×3): qty 2

## 2017-04-11 MED ORDER — POLYETHYLENE GLYCOL 3350 17 G PO PACK: 17.0000 g | PACK | Freq: Every day | ORAL | Status: DC | PRN

## 2017-04-11 MED ORDER — BUDESONIDE 0.25 MG/2ML IN SUSP
0.2500 mg | Freq: Two times a day (BID) | RESPIRATORY_TRACT | Status: DC
  Administered 2017-04-11 – 2017-04-13 (×4): 0.25 mg via RESPIRATORY_TRACT
  Filled 2017-04-11 (×4): qty 2

## 2017-04-11 NOTE — Progress Notes (Signed)
Palliative:  Full note to follow. I met today with Ms. Spellman's daughter, Curt Bears. 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 They have had many conversations regarding EOL. Curt Bears believes that her mother is ready when her time comes for death. They are very spiritual people and find strength and comfort in their faith. Discussed hospice options to follow at home, nursing facility, as well as hospice facility (usually < 2 weeks prognosis). I do believe Ms. [place] qualifies for hospice care. I am unsure if she qualifies for hospice facility (if appetite remains severely poor she may qualify). For now Curt Bears is hopeful for transition to [facility] with hospice to follow in [place]. This would be convenient for Curt Bears to return to her home and life in [place] but also be close with her mother.   Goals are clear for focus on comfort and QOL. For now they desire repeat blood transfusion as needed and supportive care. Desire for DNR confirmed with Curt Bears as well as [name]. No invasive/aggressive diagnostics or interventions desired. They both recognize that her time is likely limited and seem to have peace with EOL. CSW consulted to assist with discussions with [facility] regarding disposition options. We will continue to monitor intake and progress for hospice inpatient eligibility.   Vinie Sill, NP Palliative Medicine Team Pager # [phone] (M-F 8a-5p) Team Phone # [phone] (Nights/Weekends)

## 2017-04-11 NOTE — Progress Notes (Signed)
   Subjective:  Hemoglobin continued to down trend overnight and it is 9.2 this morning.  1 unit of PRBCs ordered.  This morning patient stated that she was not feeling well and did not feel like herself. Denies N/V. Daughter not present in the room.  Objective:  Vital signs in last 24 hours: Vitals:   04/10/17 1653 04/10/17 2126 04/11/17 0513 04/11/17 0700  BP: 126/63 (!) 131/48 117/60   Pulse: 89 92 87 86  Resp: 20 19 17 18   Temp: 98.3 F (36.8 C) 98.3 F (36.8 C) 98.7 F (37.1 C)   TempSrc: Oral Oral Oral   SpO2: 98% 100% 97%    General: very pleasant female, well-nourished, well-developed, tired-appearing but in no acute distress  Cardiac: regular rate and rhythm, nl S1/S2, no murmurs, rubs or gallops  Pulm: CTAB, no wheezes or crackles, no increased work of breathing  Abd: soft, NTND, bowel sounds present Neuro: A&Ox3, able to move all 4 extremities  Ext: warm and well perfused, no peripheral edema, s/p R AKA     Assessment/Plan:  83 year old female with PMH significant for at least 2 left sided CVAs (on DAPT with ASA/Brillinta), Left ICA Stenosis (80-99%), PVD s/p Right AKA, T2DM, HTN, HLD, and Asthma who presents with suspected acute upper GI bleed.  Upper GI Bleed:  Patient not feeling well this morning but unable to elaborate.  Appears in no acute distress on exam.  No signs of hypovolemia on exam. Hgb continues to downtrend, 9.2 this AM. One unit of blood ordered. GI consulted and will not pursue aggressive intervention given patient's age, and comorbidities. Conservative management preferred at this time. Daughter in agreement with plan.  - 1 unit pRBCs ordered  - F/u post-transfusion CBC  -  NS @ 100 mL/hr - Continue Protonix 40 mg twice daily - Zofran prn - Continue to hold ASA and Brillinta - Palliative care consulted for GOC discussion given patient's declined in health over the past year  # Thrombocytopenia, mild: Plt count fluctuating between 120s- 170s  during this admission. Baseline appears to be 150s.Suspect this is secondary to GI bleed. Will continue to monitor and plan for further evaluation with peripheral blood smear if platelets continue to downtrend.  - F/u CBC    # Leukocytosis: Improving, 15.4 today. No signs/symptoms of infection at this time.   - will continue to monitor  # Hx of CVA: Patient with at least 2 left sided CVAs, most recently in August 2018 with a new left paramedian pontine stroke. She has been on DAPT with ASA 81 mg daily and Brillinta 90 mg BID since then. Per daughter, she would like to continue ASA 81 mg QD for secondary stroke prevention  - Holding home ASA and Brillinta in the setting of GI bleed   # T2DM: Diet controlled at home. - SSI-S  # Chronic pain:  - Continue home gabapentin 100 mg QD + Cymbalta 30 mg QD + Tramadol 25 mg BID PRN   F: NS @ 100cc/hr  E: will monitor  N: Dysphagia 2    Code status: Full code   Dispo: Anticipated discharge in approximately 1-2 day(s).   Burna CashIdalys Santos-Sanchez, MD  Internal Medicine PGY-1  P [phone]

## 2017-04-11 NOTE — Progress Notes (Signed)
  Date: 04/11/2017  Patient name: Carolyn Werner  Medical record number: [id number]  Date of birth: 09/04/1921   I have seen and evaluated this patient and I have discussed the plan of care with the house staff. Please see their note for complete details. I concur with their findings with the following additions/corrections: [name] was seen on morning rounds. Her daughter was not at the bedside. She is mentally sharp today, better than yesterday. However, she continues to endorse fatigue and not feeling well overall. Her hemoglobin continues to trend downward from a maximum of 10.6 post transfusion to 9.2. She is being transfused 1 unit of packed red blood cells preemptively due to the hemoglobin decline and also because she is symptomatic. We are waiting for palliative cares' input as it is not yet clear the best setting for her at discharge.  Burns SpainButcher, Elizabeth A, MD 04/11/2017, 1:23 PM

## 2017-04-11 NOTE — Progress Notes (Signed)
Modified Barium Swallow Progress Note  Patient Details  Name: Carolyn Werner MRN: [id number] Date of Birth: 03/13/1922  Today's Date: 04/11/2017  Modified Barium Swallow completed.  Full report located under Chart Review in the Imaging Section.  Brief recommendations include the following:  Clinical Impression  Pt exhibits mild oral and pharyngeal phase dysphagia noteably with premature spill with one trial thin, lingual residue spill post swallow and delayed oral transit to posterior oral cavity (noted saliva residue prior po's at rest). Pharyngeal phase deficits appear motor based with incomplete epiglottic inversion retaining mild and moderate volume of residue throughout study and laryngeal penetration (silent) with thin via cup and straw increasing in amount and depth in vestiuble as study progressed. Head position was in somewhat tucked position due to pt's posture. Verbal cues for second swallow removed moderate amount of residue. Overall swallow appeared more coordinated and with heavier volume with nectar and did not result in increased residue. No esophageal issues on brief non assessement scan. Recommend continue Dys 2 texture, nectar thick for all liquids excexpt water to increase comfort and decrease dehydration risk. May have small sips thin water following oral care. Pills crushed, full supervision/assist and continued ST.   Swallow Evaluation Recommendations       SLP Diet Recommendations: Dysphagia 2 (Fine chop) solids;Nectar thick liquid;Other (Comment)(allow thin water)   Liquid Administration via: Cup;No straw   Medication Administration: Crushed with puree   Supervision: Patient able to self feed;Full supervision/cueing for compensatory strategies   Compensations: Minimize environmental distractions;Small sips/bites;Slow rate;Clear throat intermittently   Postural Changes: Seated upright at 90 degrees   Oral Care Recommendations: Oral care BID         Royce MacadamiaLitaker, Dewaine Morocho Willis 04/11/2017,12:52 PM   Breck CoonsLisa Willis Lonell FaceLitaker M.Ed [employer] Pager [phone]

## 2017-04-12 ENCOUNTER — Ambulatory Visit: Payer: Medicare Other | Admitting: Nurse Practitioner

## 2017-04-12 DIAGNOSIS — Z515 Encounter for palliative care: Secondary | ICD-10-CM

## 2017-04-12 DIAGNOSIS — Z7189 Other specified counseling: Secondary | ICD-10-CM

## 2017-04-12 LAB — BPAM RBC
BLOOD PRODUCT EXPIRATION DATE: 201811152359
Blood Product Expiration Date: 201811232359
ISSUE DATE / TIME: 201811052344
ISSUE DATE / TIME: 201811070840
Unit Type and Rh: 6200
Unit Type and Rh: 6200

## 2017-04-12 LAB — CBC
HCT: 34.1 % — ABNORMAL LOW (ref 36.0–46.0)
Hemoglobin: 11.4 g/dL — ABNORMAL LOW (ref 12.0–15.0)
MCH: 29.7 pg (ref 26.0–34.0)
MCHC: 33.4 g/dL (ref 30.0–36.0)
MCV: 88.8 fL (ref 78.0–100.0)
Platelets: 129 10*3/uL — ABNORMAL LOW (ref 150–400)
RBC: 3.84 MIL/uL — AB (ref 3.87–5.11)
RDW: 13.4 % (ref 11.5–15.5)
WBC: 11.8 10*3/uL — ABNORMAL HIGH (ref 4.0–10.5)

## 2017-04-12 LAB — TYPE AND SCREEN
ABO/RH(D): A POS
ANTIBODY SCREEN: NEGATIVE
Unit division: 0
Unit division: 0

## 2017-04-12 LAB — GLUCOSE, CAPILLARY
GLUCOSE-CAPILLARY: 89 mg/dL (ref 65–99)
Glucose-Capillary: 86 mg/dL (ref 65–99)
Glucose-Capillary: 114 mg/dL — ABNORMAL HIGH (ref 65–99)
Glucose-Capillary: 129 mg/dL — ABNORMAL HIGH (ref 65–99)

## 2017-04-12 MED ORDER — RESOURCE THICKENUP CLEAR PO POWD
ORAL | Status: DC | PRN
  Administered 2017-04-12: 10:00:00 via ORAL
  Administered 2017-04-13: 1 via ORAL
  Administered 2017-04-13: 10:00:00 via ORAL
  Filled 2017-04-12: qty 125

## 2017-04-12 NOTE — Evaluation (Signed)
Physical Therapy Evaluation Patient Details Name: Carolyn Werner MRN: [id number] DOB: 09/30/1921 Today's Date: 04/12/2017   History of Present Illness  83 year old female with PMH significant for at least 2 left sided CVAs (on DAPT with ASA/Brillinta), Left ICA Stenosis (80-99%), PVD s/p Right AKA, T2DM, HTN, HLD, and Asthma who presents with suspected acute upper GI bleed.  Clinical Impression  Patient presents with decreased independence with mobility due to weakness and limited safety awareness.  Currently mod A for OOB transfers.  She cannot let me know her previous status and no family available.  Per chart [facility] [place].  PT to follow acutely for mobility for QOL.    Follow Up Recommendations SNF([facility] [place])    Equipment Recommendations  None recommended by PT    Recommendations for Other Services       Precautions / Restrictions Precautions Precautions: Fall Restrictions Weight Bearing Restrictions: Yes      Mobility  Bed Mobility Overal bed mobility: Needs Assistance Bed Mobility: Supine to Sit     Supine to sit: Modified independent (Device/Increase time)     General bed mobility comments: sitting EOB when PT walked in room had set off bed alarm  Transfers Overall transfer level: Needs assistance   Transfers: Stand Pivot Transfers   Stand pivot transfers: +2 safety/equipment;Mod assist       General transfer comment: able to stand on R to pivot to [facility] and back to bed, NT able to assist for safety to make sure pt reached to armrest on BSC and to steady [facility]  Ambulation/Gait                Stairs            Wheelchair Mobility    Modified Rankin (Stroke Patients Only)       Balance Overall balance assessment: Needs assistance   Sitting balance-Leahy Scale: Fair       Standing balance-Leahy Scale: Poor Standing balance comment: assist needed in standing  on L LE during transfer                             Pertinent Vitals/Pain Pain Assessment: No/denies pain    Home Living Family/patient expects to be discharged to:: Skilled nursing facility Living Arrangements: Children Available Help at Discharge: Personal care attendant;Available PRN/intermittently;Family Type of Home: Apartment Home Access: Ramped entrance     Home Layout: One level Home Equipment: Bedside commode;Tub bench;Grab bars - toilet;Grab bars - tub/shower;Wheelchair - power;Hospital bed;Other (comment)      Prior Function Level of Independence: Needs assistance         Comments: 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      Hand Dominance   Dominant Hand: Right    Extremity/Trunk Assessment   Upper Extremity Assessment Upper Extremity Assessment: Generalized weakness    Lower Extremity Assessment Lower Extremity Assessment: Generalized weakness;RLE deficits/detail RLE Deficits / Details: R AKA       Communication   Communication: HOH(has hearing aides)  Cognition Arousal/Alertness: Awake/alert Behavior During Therapy: Impulsive Overall Cognitive Status: No family/caregiver present to determine baseline cognitive functioning Area of Impairment: Memory;Safety/judgement                     Memory: Decreased short-term memory   Safety/Judgement: Decreased awareness of safety     General Comments: did not recall  having been bathed earlier this morning, wanting to wash up,  attempting up unaided to toilet      General Comments General comments (skin integrity, edema, etc.): Able to attempt self care for toilet hygiene, but ineffective while sitting on BSC so NT assisted while PT assisted to stand    Exercises     Assessment/Plan    PT Assessment Patient needs continued PT services  PT Problem List Decreased strength;Decreased safety awareness;Decreased knowledge of precautions;Decreased cognition;Decreased activity  tolerance;Decreased balance       PT Treatment Interventions DME instruction;Therapeutic activities;Balance training;Wheelchair mobility training;Functional mobility training;Therapeutic exercise;Patient/family education    PT Goals (Current goals can be found in the Care Plan section)  Acute Rehab PT Goals Patient Stated Goal: Per Palliative note goals are comfort and QOL PT Goal Formulation: Patient unable to participate in goal setting Time For Goal Achievement: 04/26/17 Potential to Achieve Goals: Fair    Frequency Min 2X/week   Barriers to discharge        Co-evaluation               AM-PAC PT "6 Clicks" Daily Activity  Outcome Measure Difficulty turning over in bed (including adjusting bedclothes, sheets and blankets)?: A Little Difficulty moving from lying on back to sitting on the side of the bed? : A Little Difficulty sitting down on and standing up from a chair with arms (e.g., wheelchair, bedside commode, etc,.)?: Unable Help needed moving to and from a bed to chair (including a wheelchair)?: A Lot Help needed walking in hospital room?: Total Help needed climbing 3-5 steps with a railing? : Total 6 Click Score: 11    End of Session Equipment Utilized During Treatment: Gait belt Activity Tolerance: Patient tolerated treatment well Patient left: in bed;with bed alarm set   PT Visit Diagnosis: Muscle weakness (generalized) (M62.81);Other symptoms and signs involving the nervous system (R29.898)    Time: [id number] PT Time Calculation (min) (ACUTE ONLY): 28 min   Charges:   PT Evaluation $PT Eval Moderate Complexity: 1 Mod PT Treatments $Therapeutic Activity: 8-22 mins   PT G CodesSheran Lawless:        Carolyn Werner, Carolyn Werner 04/12/2017   Carolyn Werner 04/12/2017, 10:50 AM

## 2017-04-12 NOTE — Progress Notes (Signed)
  Date: 04/12/2017  Patient name: Carolyn IvoryJuanita Werner  Medical record number: [id number]  Date of birth: 04/19/1922   I have seen and evaluated this patient and I have discussed the plan of care with the house staff. Please see their note for complete details. I concur with their findings with the following additions/corrections: Alert and interactive but states not feeling well. HgB and Plts stable. For SNF once facility decided on.  Burns SpainButcher, Elizabeth A, MD 04/12/2017, 3:00 PM

## 2017-04-12 NOTE — Progress Notes (Signed)
  Speech Language Pathology Treatment: Dysphagia  Patient Details Name: Carolyn Werner MRN: [id number] DOB: 09/23/1921 Today's Date: 04/12/2017 Time: [id number] SLP Time Calculation (min) (ACUTE ONLY): 21 min  Assessment / Plan / Recommendation Clinical Impression  Pt alert, SLP reviewed MBS results from yesterday; uncertain how much she comprehended or retained (hearing loss and slightly slower processing). Pt stated she would like to use a straw and doesn't dislike the thick liquids but doesn't care for them when they are "too thick." Pt brushed teeth followed by trials thin water with delayed cough and delayed cough after one of two sip nectar thick cranberry juice.   Daughter not present. Palliative care documentation noted. Pt and family may choose to forgo thick liquids for quality of life or continue with nectar (if coughing is excessive with thin) and thin water in between meals with known risks. She may be leaving with hospice care. Will continue intervention.    HPI HPI: 83 year old female admitted 04/09/17 after throwing up blood. PMH significant for DM, CVA. CXR - LLL scarring, no edema or consolidation. MRI - no acute findings, multiple old cerebellar infarcts and old small left occipital lobe infarct. No evidence of GI bleed per GI.      SLP Plan  Continue with current plan of care       Recommendations  Diet recommendations: Dysphagia 2 (fine chop);Nectar-thick liquid(thin water) Liquids provided via: Cup;No straw Medication Administration: Crushed with puree Supervision: Patient able to self feed;Intermittent supervision to cue for compensatory strategies Compensations: Minimize environmental distractions;Small sips/bites;Slow rate;Clear throat intermittently Postural Changes and/or Swallow Maneuvers: Seated upright 90 degrees                Oral Care Recommendations: Oral care BID Follow up Recommendations: None SLP Visit Diagnosis: Dysphagia, oropharyngeal phase  (R13.12) Plan: Continue with current plan of care                       Royce MacadamiaLitaker, Pecola Haxton Willis 04/12/2017, 8:28 AM   Breck CoonsLisa Willis Lonell FaceLitaker M.Ed [employer] Pager [phone]

## 2017-04-12 NOTE — Progress Notes (Signed)
Patients BP 182/73 and complaining of severe throbbing headache, 10/10, on right side of head. RN administered PRN tramadol 25mg  and rechecked pain and BP.   Pain-10/10, severe, with patient continously moaning BP-177/75  RN notified on call MD, Nedrud. MD advised RN to administer Tylenol PRN and to continue to monitor patient.

## 2017-04-12 NOTE — Plan of Care (Signed)
Bed alarm on, non skid socks on, call light and belongings in reach, hourly rounds done 

## 2017-04-12 NOTE — Progress Notes (Signed)
   Subjective:  No acute events overnight. Patient continues to endorse not feeling well, but unable to elaborate. She did seem in better spirits and more awake this morning. Friend present in the room during encounter. She wants to go home.   Objective:  Vital signs in last 24 hours: Vitals:   04/11/17 2025 04/11/17 2032 04/12/17 0430 04/12/17 0824  BP:  (!) 147/64 (!) 162/59 (!) 172/58  Pulse: 75 73 80 79  Resp: 16 16 20 20   Temp:  98.3 F (36.8 C) 98.5 F (36.9 C) 98.8 F (37.1 C)  TempSrc:  Oral Oral Oral  SpO2: 100% 100% 100% 97%  Weight:  121 lb 14.6 oz (55.3 kg)     General: Very pleasant female, well-developed, appears more awake and alert this morning acute distress Cardiac: regular rate and rhythm, nl S1/S2, no murmurs, rubs or gallops  Abd: soft, NTND, bowel sounds present  Neuro: A&Ox3, no focal deficits noted, s/p R AKA  Ext: warm and well perfused, no peripheral edema   Assessment/Plan:   83 year old female with PMH significant for at least 2 left sided CVAs (on DAPT with ASA/Brillinta), Left ICA Stenosis (80-99%), PVD s/p Right AKA, T2DM, HTN, HLD, and Asthma who presents with suspected acute upper GI bleed.  Upper GI Bleed: Patient continues to not feel well this morning but unable to elaborate.  Appears in no acute distress on exam and no signs of hypovolemia. Hgb 11.4 this AM. GI consulted and will not pursue aggressive intervention given patient's age, and comorbidities. Conservative management preferred at this time. Daughter in agreement with plan.  We will continue conservative management as below.  Patient will be discharged tomorrow to a SNF facility per daughter request.  Case management aware. - CBC in AM  -Continue Protonix 40 mg twice daily - Zofran prn -Continue to hold ASA and Brillinta. Will plan to resume only ASA on discharge.  -Palliative care consulted, appreciate recommendations   # Thrombocytopenia, mild: Plt count fluctuating between  120s- 170s during this admission. Baseline appears to be 150s.Suspect this is secondary to GI bleed. Will continue to monitor and plan for further evaluation with peripheral blood smear if platelets continue to downtrend.  - will continue to monitor, currently stable at 129     # Leukocytosis: continues to improve, 11.8 today. No signs/symptoms of infection at this time.   - will continue to monitor  #Hx of CVA: Patient with at least 2 left sided CVAs, most recently in August 2018 with a new left paramedian pontine stroke. She has been on DAPT with ASA 81 mg daily and Brillinta 90 mg BID since then. Per daughter, she would like to continue ASA 81 mg QD for secondary stroke prevention  - Holding home ASA and Brillinta in the setting of GI bleed. Will plan to resume only ASA on discharge.   #T2DM: Diet controlled at home. - SSI-S  # Chronic pain:  - Continue home gabapentin 100 mg QD + Cymbalta 30 mg QD + Tramadol 25 mg BID PRN   F: none  E: will monitor  N: Dysphagia 2    Code status: Full code    Dispo: Anticipated discharge tomorrow pending SNF placement.   Burna CashIdalys Santos-Sanchez, MD  Internal Medicine PGY-1  P [phone]

## 2017-04-13 LAB — CBC
HEMATOCRIT: 34.8 % — AB (ref 36.0–46.0)
HEMOGLOBIN: 12 g/dL (ref 12.0–15.0)
MCH: 30.2 pg (ref 26.0–34.0)
MCHC: 34.5 g/dL (ref 30.0–36.0)
MCV: 87.7 fL (ref 78.0–100.0)
Platelets: 159 10*3/uL (ref 150–400)
RBC: 3.97 MIL/uL (ref 3.87–5.11)
RDW: 12.9 % (ref 11.5–15.5)
WBC: 10.7 10*3/uL — AB (ref 4.0–10.5)

## 2017-04-13 LAB — GLUCOSE, CAPILLARY
GLUCOSE-CAPILLARY: 183 mg/dL — AB (ref 65–99)
Glucose-Capillary: 89 mg/dL (ref 65–99)

## 2017-04-13 MED ORDER — PANTOPRAZOLE SODIUM 40 MG PO TBEC: 40.0000 mg | DELAYED_RELEASE_TABLET | Freq: Two times a day (BID) | ORAL | 0 refills | Status: DC

## 2017-04-13 MED ORDER — AMLODIPINE BESYLATE 5 MG PO TABS
2.5000 mg | ORAL_TABLET | Freq: Every day | ORAL | Status: DC
  Administered 2017-04-13: 2.5 mg via ORAL
  Filled 2017-04-13: qty 1

## 2017-04-13 MED ORDER — HYDRALAZINE HCL 20 MG/ML IJ SOLN
5.0000 mg | Freq: Once | INTRAMUSCULAR | Status: AC
  Administered 2017-04-13: 5 mg via INTRAVENOUS
  Filled 2017-04-13: qty 1

## 2017-04-13 MED ORDER — TRAMADOL HCL 50 MG PO TABS
25.0000 mg | ORAL_TABLET | Freq: Once | ORAL | Status: AC
  Administered 2017-04-13: 25 mg via ORAL
  Filled 2017-04-13: qty 1

## 2017-04-13 MED ORDER — AMLODIPINE BESYLATE 2.5 MG PO TABS: 2.5000 mg | ORAL_TABLET | Freq: Every day | ORAL | 2 refills | Status: DC

## 2017-04-13 NOTE — Progress Notes (Signed)
Report called to [facility]Ann at [facility]. Discharge instructions explained to daughter. Denies questions. Pt to be transported by ambulance service.

## 2017-04-13 NOTE — Plan of Care (Signed)
Patient aware of need to monitor hemoglobin. Will continue to encourage PO intake.

## 2017-04-13 NOTE — Discharge Instructions (Addendum)
[facility] due to bleeding from your GI tract that was likely caused by the combination aspirin and brillinta that you were on to prevent future strokes.   Please stop taking Brillinta.   You can continue taking aspirin 81 mg 1 tablet once a day. Please start taking Protonix 40 mg 2 tablets 2 times a day.   Please follow up with your regular doctor.

## 2017-04-13 NOTE — NC FL2 (Signed)
[place] MEDICAID FL2 LEVEL OF CARE SCREENING TOOL     IDENTIFICATION  Patient Name: Carolyn Werner Birthdate: 06/02/1922 Sex: female Admission Date (Current Location): 04/09/2017  [facility] [place]   Facility and Address:  [facility] [place], [address], [place], [place] [address]      Provider Number: [id number]  Attending Physician Name and Address:  Burns SpainButcher, Elizabeth A, MD  Relative Name and Phone Number:  Kerry HoughKathryn Addo - daughter; [phone]    Current Level of Care: Hospital Recommended Level of Care: Skilled Nursing Facility Prior Approval Number:    Date Approved/Denied:   PASRR Number: [id number](Eff. 07/29/16)  Discharge Plan: SNF    Current Diagnoses: Patient Active Problem List   Diagnosis Date Noted  . Goals of care, counseling/discussion   . Palliative care encounter   . Acute upper GI bleed 04/09/2017  . Syncope, vasovagal 03/21/2017  . Carotid stenosis 03/21/2017  . S/P AKA (above knee amputation) unilateral, right ([name])   . Irritable bowel syndrome   . Diabetic peripheral neuropathy ([name])   . Embolic stroke ([name]) 01/30/2017  . Ataxia, post-stroke   . Diabetes mellitus type 2 in nonobese ([name])   . Stage 3 chronic kidney disease ([name])   . History of right above knee amputation ([name])   . History of stroke   . Benign essential HTN   . Transient cerebral ischemia   . Cerebral thrombosis with cerebral infarction 01/28/2017  . Right arm weakness 01/26/2017  . Weakness 01/26/2017  . Hyperlipemia 10/05/2016  . Weakness of both arms   . Left pontine stroke ([name]) 07/28/2016  . 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 Slurred speech 07/27/2016  . CKD (chronic kidney disease) stage 3, GFR 30-59 ml/min ([name]) 07/27/2016  . Diabetes mellitus ([name]) 07/27/2016  . Essential hypertension 07/27/2016  . Depression 07/27/2016  . Glaucoma 07/27/2016  . Neuropathic pain 07/27/2016  .  Asthma 07/27/2016  . Heart palpitations 07/05/2016  . Cardiac murmur 07/05/2016  . Phantom limb pain ([name]) 11/13/2014  . Cervical disc disorder with radiculopathy of cervical region 11/13/2014    Orientation RESPIRATION BLADDER Height & Weight     Self, Time, Situation, Place  Normal Incontinent Weight: 120 lb (54.4 kg) Height:     BEHAVIORAL SYMPTOMS/MOOD NEUROLOGICAL BOWEL NUTRITION STATUS      Continent Diet(DYS 3)  AMBULATORY STATUS COMMUNICATION OF NEEDS Skin   Total Care(Patient not ambulated with PT during eval on 11/8)   Normal                       Personal Care Assistance Level of Assistance  Bathing, Feeding, Dressing Bathing Assistance: Limited assistance Feeding assistance: Independent Dressing Assistance: Limited assistance     Functional Limitations Info  Sight, Hearing, Speech Sight Info: Adequate Hearing Info: Impaired(Uses hearing aids) Speech Info: Adequate    SPECIAL CARE FACTORS FREQUENCY  PT (By licensed PT), Speech therapy     PT Frequency: Evaluated 11/8       Speech Therapy Frequency: Swallow eval on 11/6      Contractures      Additional Factors Info  Allergies   Allergies Info: Atacand hct candesartan cilexetil-hctz; Carbamazepine; Erythromycin; Glucophage metformin, hcl; Lactose intolerance (gi); Lipitor atorvastatin; Nsaids; Penicillins; Sulfa antibiotics; Ceclor, cefaclor; Clindamycin/lincomycin; Milk-related compounds; Rosuvastatin calcium; Dilaudid,  hydromorphone hcl; and Plavix clopidogrel           Current Medications (04/13/2017):  This is the current hospital active medication list Current Facility-Administered Medications  Medication Dose Route Frequency Provider Last Rate Last Dose  . acetaminophen (TYLENOL) tablet 650 mg  650 mg Oral Q6H PRN Darreld McleanPatel, Vishal, MD   650 mg at 04/12/17 2218   Or  . acetaminophen (TYLENOL) suppository 650 mg  650 mg Rectal Q6H PRN Darreld McleanPatel, Vishal, MD      . budesonide (PULMICORT) nebulizer  solution 0.25 mg  0.25 mg Nebulization BID Yong ChannelParker, Alicia C, NP   0.25 mg at 04/13/17 0909  . cholecalciferol (VITAMIN D) tablet 2,000 Units  2,000 Units Oral Daily Ulice BoldParker, Alicia C, NP   2,000 Units at 04/13/17 [phone]  . DULoxetine (CYMBALTA) DR capsule 30 mg  30 mg Oral Daily Burna CashSantos-Sanchez, Idalys, MD   30 mg at 04/13/17 0953  . food thickener (SIMPLYTHICK) packet 1 packet  1 packet Oral TID WC & HS Burns SpainButcher, Elizabeth A, MD   1 packet at 04/11/17 2156  . gabapentin (NEURONTIN) capsule 100 mg  100 mg Oral Daily Burns SpainButcher, Elizabeth A, MD   100 mg at 04/13/17 [phone]   And  . gabapentin (NEURONTIN) capsule 200 mg  200 mg Oral QHS Burns SpainButcher, Elizabeth A, MD   200 mg at 04/12/17 2102  . insulin aspart (novoLOG) injection 0-9 Units  0-9 Units Subcutaneous TID WC Darreld McleanPatel, Vishal, MD   1 Units at 04/11/17 1734  . latanoprost (XALATAN) 0.005 % ophthalmic solution 1 drop  1 drop Both Eyes QHS Ulice BoldParker, Alicia C, NP   1 drop at 04/12/17 2105  . levalbuterol (XOPENEX) nebulizer solution 0.63 mg  0.63 mg Nebulization Q4H PRN Burns SpainButcher, Elizabeth A, MD      . ondansetron [facility](ZOFRAN) tablet 4 mg  4 mg Oral Q6H PRN Darreld McleanPatel, Vishal, MD       Or  . ondansetron (ZOFRAN) injection 4 mg  4 mg Intravenous Q6H PRN Darreld McleanPatel, Vishal, MD      . pantoprazole (PROTONIX) EC tablet 40 mg  40 mg Oral BID Levora DredgeHelberg, Justin, MD   40 mg at 04/13/17 0953  . polyethylene glycol (MIRALAX / GLYCOLAX) packet 17 g  17 g Oral Daily PRN Ulice BoldParker, Alicia C, NP      . RESOURCE THICKENUP CLEAR   Oral PRN Burns SpainButcher, Elizabeth A, MD   1 Container at 04/13/17 [phone]  . senna-docusate (Senokot-S) tablet 1-2 tablet  1-2 tablet Oral QHS PRN Rozann LeschesNedrud, Marybeth, MD   2 tablet at 04/11/17 1113  . traMADol (ULTRAM) tablet 25 mg  25 mg Oral BID PRN Burna CashSantos-Sanchez, Idalys, MD   25 mg at 04/12/17 2100     Discharge Medications: Please see discharge summary for a list of discharge medications.  Relevant Imaging Results:  Relevant Lab Results:   Additional  Information ss#[id number]. Patient has right AKA and amputated toes on left foot  Okey Duprerawford, Lazaro ArmsVanessa Bradley, LCSW

## 2017-04-13 NOTE — Progress Notes (Signed)
  Date: 04/13/2017  Patient name: Carolyn Werner  Medical record number: [id number]  Date of birth: 03/20/1922   I have seen and evaluated this patient and I have discussed the plan of care with the house staff. Please see their note for complete details. I concur with their findings with the following additions/corrections: [name] is stable for D/C - HgB stable. Agree with resuming amlodipine 2.5. For SNF.   Burns SpainButcher, Elizabeth A, MD 04/13/2017, 2:54 PM

## 2017-04-13 NOTE — Clinical Social Work Note (Signed)
Clinical Social Work Assessment  Patient Details  Name: Carolyn Werner MRN: [id number] Date of Birth: 06/15/1921  Date of referral:  04/11/17               Reason for consult:  Facility Placement                Permission sought to share information with:  Family Supports Permission granted to share information::  Yes, Verbal Permission Granted  Name::     Kerry HoughKathryn Addo  Agency::     Relationship::  Daughter  Contact Information:  [phone]  Housing/Transportation Living arrangements for the past 2 months:  Apartment Source of Information:  Adult Children Patient Interpreter Needed:  None Criminal Activity/Legal Involvement Pertinent to Current Situation/Hospitalization:  No - Comment as needed Significant Relationships:  Adult Children Lives with:  Adult Children Do you feel safe going back to the place where you live?  No(Daughter reported that she cannot care for her mother at home at this time and is agreeable to rehab or Hospice care (if currently appropriate)) Need for family participation in patient care:  Yes (Comment)  Care giving concerns:  Daughter expressed keen awareness that her mother is declining and needs more care than she can provide at home.  Daughter expressed that she is very committed to her care and wants to make sure her mother is comfortable and safe.  Social Worker assessment / plan:  CSW talked with daughter by phone and patient and daughter at the bedside on Thursday, 11/8. Mrs. Gervais was sitting up in bed and was awake and alert and talked briefly with CSW. She is hard-of-hearing, so CSW spoke loudly enough for Mrs. Stamant to hear when comments were directed to her. 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 Patient is also being followed by Palliative Care and a hospice facility versus Palliative following at a SNF  was discussed. Per daughter, her mother has been receiving services through [employer] (an adult daycare) and the [facility] pays for 2 days and her mother has a scholarship that pays for 2 days. The daycare is staffed by an Charity fundraiserN and CNA per daughter. Ms. Mathis Budddo is agreeable to SNF and is hopeful that she can transition to [facility] [place] or a [facility] facility.   Employment status:  Retired Engineer, miningnsurance information:  Medicare, Managed Care PT Recommendations:  Skilled Nursing Facility Information / Referral to community resources:  Other (Comment Required)(Daughter provided CSW with facility preference)  Patient/Family's Response to care:  Daughter expressed no concerns regarding patient's care during hospitalization.  Patient/Family's Understanding of and Emotional Response to Diagnosis, Current Treatment, and Prognosis:  Daughter is knowledgeable about her mother's health status and has been very proactive in accessing resources to assist in her mother's care and safety. Ms. Osker Masonddo's  main goal is to have her mother placed appropriately and receive the care she needs.  Emotional Assessment Appearance:  Appears younger than stated age Attitude/Demeanor/Rapport:  Other(Appropriate) Affect (typically observed):  Calm, Pleasant, Appropriate Orientation:  Oriented to Self, Oriented to Place, Oriented to  Time, Oriented to Situation Alcohol / Substance use:  Tobacco Use, Alcohol Use, Illicit Drugs(Patient reported that she quit smoking and does not drink or use illicit drugs) Psych involvement (Current and /or in the community):  No (Comment)  Discharge Needs  Concerns to  be addressed:  Discharge Planning Concerns Readmission within the last 30 days:  No Current discharge risk:  None Barriers to Discharge:  No Barriers Identified   Cristobal GoldmannCrawford, Koy Lamp Bradley, LCSW 04/13/2017, 10:52 AM

## 2017-04-13 NOTE — Progress Notes (Signed)
MD, Nedrud, called RN asking about repeat BP at 2357. RN was not aware that MD wanted a repeat BP on patient, per previous verbals orders given. RN checked BP, 177/66. RN made MD aware.  MD at beside.  MD advised RN to given 5 mg of Hydralazine and another one time dose of Tramadol 25mg . As well as Q4 Neuro checks on patient. RN will implement orders and continue to monitor patient.   Veatrice KellsMahmoud,Eleen Litz I, RN

## 2017-04-13 NOTE — Progress Notes (Signed)
   Subjective:  Patient complained of HA associated with photophobia overnight and received Tylenol x2 and Tramadol with resolution. This morning she reports feeling well and much better than yesterday. Seems in better spirits. Denies HA and N/V. She would like to go home.   Objective:  Vital signs in last 24 hours: Vitals:   04/13/17 0018 04/13/17 0038 04/13/17 0100 04/13/17 0443  BP: (!) 182/60 (!) 169/57 (!) 155/51 (!) 174/65  Pulse: 75   77  Resp:    19  Temp:    98.4 F (36.9 C)  TempSrc:    Oral  SpO2:      Weight:       General: very pleasant female, well-nourished, well-developed, in no NAD  Cardiac: regular rate and rhythm, nl S1/S2, no murmurs, rubs or gallops  Pulm: CTAB, no wheezes or crackles, no increased work of breathing  Abd: soft, NTND, bowel sounds present  Neuro: A&Ox3, able to move all 4 extremities s/p R AKA  Ext: warm and well perfused, no peripheral edema bilaterally    Assessment/Plan:  83 year old female with PMH significant for at least 2 left sided CVAs (on DAPT with ASA/Brillinta), Left ICA Stenosis (80-99%), PVD s/p Right AKA, T2DM, HTN, HLD, and asthma who presents with suspected acute upper GI bleed.  Upper GI Bleed:suspect mucosal bleeding in the setting of DAPT for secondary stroke prevention. Patient states she is feeling well this morning. Hgb 12 this AM and remains HDS. Anticipate discharge today she is medically stable.  -ContinueProtonix 40 mg twice daily - Zofran prn -Continue to hold ASA and Brillinta. Will plan to resume only ASA on discharge.  -Palliative care consulted, appreciate recommendations   # HTN: Patient's BP persistently elevated during admission likely secondary to IVF. Does not appear to be on any BP medications at home. However, she was discharged on amlodipine 2.5 mg QD. Will restart.  - Start amlodipine 2.5 mg QD   # Thrombocytopenia, mild: Resolved     # Leukocytosis: Resolved.   #Hx of CVA: Patient  with at least 2 left sided CVAs, most recently in August 2018 with a new left paramedian pontine stroke. She has been on DAPT with ASA 81 mg daily and Brillinta 90 mg BID since then.Per daughter, she would like to continue ASA 81 mg QD for secondary stroke prevention - Holding home ASA and Brillinta in the setting of GI bleed. Will plan to resume only ASA on discharge.   #T2DM: Diet controlled at home. - SSI-S  # Chronic pain:  - Continue home gabapentin 100 mg QD + Cymbalta 30 mg QD + Tramadol 25 mg BID PRN   F: none  E: will monitor  N: Dysphagia 2    Code status: Full code   Dispo: Anticipated discharge in approximately today-1 day(s) pending SNF placement.   Burna CashIdalys Santos-Sanchez, MD  Internal Medicine PGY-1  P [phone]

## 2017-04-13 NOTE — Progress Notes (Addendum)
Paged about 10/10 headache with elevated SBP @ 2200. Repeat SBP @ 2400 still elevated >170 with reported 10/10 headache. Went to bedside to reevaluate patient.   Upon arrival patient sleeping comfortably in room. When awoken patient states that she has a throbbing headache on right forehead/side of head. Patient endorses photophobia. No nausea/vomiting. Patient reports history of migraine headaches, stating that she had them when she was a teenager but none were this severe. Tylenol and Tramadol helped a little but not too much. Patient states that her headache was improved this evening when she feel asleep.   Physical Exam: Gen: Patient sleeping comfortably in bed in no acute distress, aroused easily to voice Neuro: Pupils symmetric. EOM intact. Smile symmetric. Tongue midline. Sensation to light touch on forehead, cheeks, and mandible intact bilaterally. Grip strength 5/5 bilaterally. Gross motor and sensation of bilateral lower extremities intact bilaterally. CV/Pulm: RRR, no murmurs, anterior lung fields clear to auscultation bilaterally.   Patient has no current neuro deficits worrisome for stroke. Initially [facility] given patient's description of 10/10 pain, however PE is reassuring. While patient does not have nausea/vomiting, does endorse photophobia and history of migraine headaches. It is likely that current headache represents migraine. Will provide tylenol and additional dose of PRN tramadol. Given history of stroke, will write for q4 hour neuro checks.

## 2017-04-13 NOTE — Clinical Social Work Placement (Addendum)
   CLINICAL SOCIAL WORK PLACEMENT  NOTE 04/13/17 - DISCHARGED TO [facility]IA AMBULANCE.  Date:  04/13/2017  Patient Details  Name: Carolyn Werner MRN: [id number] Date of Birth: 03/19/1922  Clinical Social Work is seeking post-discharge placement for this patient at the Skilled  Nursing Facility level of care (*CSW will initial, date and re-position this form in  chart as items are completed):  No(Daughter provided CSW with facility preference)   Patient/family provided with [facility] Clinical Social Work Department's list of facilities offering this level of care within the geographic area requested by the patient (or if unable, by the patient's family).  Yes   Patient/family informed of their freedom to choose among providers that offer the needed level of care, that participate in Medicare, Medicaid or managed care program needed by the patient, have an available bed and are willing to accept the patient.  No   Patient/family informed of [place]'s ownership interest in [facility] and [facility], as well as of the fact that they are under no obligation to receive care at these facilities.  PASRR submitted to EDS on       PASRR number received on       Existing PASRR number confirmed on 04/13/17     FL2 transmitted to all facilities in geographic area requested by pt/family on 04/13/17     FL2 transmitted to all facilities within larger geographic area on       Patient informed that his/her managed care company has contracts with or will negotiate with certain facilities, including the following:         04/12/17 - Patient/family informed of bed offers received.  [facility]  [facility]     Physician recommends and patient chooses bed at      [facility]  04[facility].  Patient to be transferred to facility by  ambulance   Patient family notified on  04/13/17 of transfer.  Name of family member  notified:   Daughter Kerry HoughKathryn Addo at the bedside.     PHYSICIAN       Additional Comment:    _______________________________________________ Cristobal Goldmannrawford, Kyaire Gruenewald Bradley, LCSW 04/13/2017, 11:12 AM

## 2017-04-13 NOTE — Discharge Summary (Signed)
Name: Carolyn Werner MRN: [id number] DOB: 03/06/1922 83 y.o. PCP: Lynn ItoSmith, Lori, MD  Date of Admission: 04/09/2017  5:38 AM Date of Discharge: 04/13/2017 Attending Physician: Burns SpainButcher, Elizabeth A, MD  Discharge Diagnosis: 1. Acute upper GI bleed  2. Goals of care discussion   Principal Problem:   Acute upper GI bleed Active Problems:   CKD (chronic kidney disease) stage 3, GFR 30-59 ml/min ([name])   Diabetes mellitus ([name])   History of stroke   Goals of care, counseling/discussion   Palliative care encounter   Discharge Medications: Allergies as of 04/13/2017      Reactions   Atacand Hct [candesartan Cilexetil-hctz] Other (See Comments)   Kidney disease   Carbamazepine Other (See Comments)   Changes in ability to read, think clearly, unsteady gait   Erythromycin Diarrhea   Glucophage [metformin Hcl] Other (See Comments)   Chronic low blood sugar   Lactose Intolerance (gi) Diarrhea, Nausea And Vomiting   Nausea & Vomiting; Diarrhea   Lipitor [atorvastatin] Other (See Comments)   Muscle weakness   Nsaids Nausea And Vomiting   Motrin, Naprosyn, Voltaren, Celebrex, Prevacid, Mobic -- abdominal pain and burning, cramps, loose stool   Penicillins Shortness Of Breath, Rash   Has patient had a PCN reaction causing immediate rash, facial/tongue/throat swelling, SOB or lightheadedness with hypotension: Yes Has patient had a PCN reaction causing severe rash involving mucus membranes or skin necrosis: No Has patient had a PCN reaction that required hospitalization: No Has patient had a PCN reaction occurring within the last 10 years: No If all of the above answers are "NO", then may proceed with Cephalosporin use.   Sulfa Antibiotics Anaphylaxis   Ceclor [cefaclor] Hives   Clindamycin/lincomycin Other (See Comments)   Abdominal pain, loss of appetite   Milk-related Compounds Other (See Comments)   Bloating, abdominal discomfort.   Rosuvastatin Calcium Other (See Comments)   Lethargy, malaise   Dilaudid [hydromorphone Hcl] Rash   Plavix [clopidogrel] Rash, Other (See Comments)   Skin peeled, hair fell out      Medication List    STOP taking these medications   ticagrelor 90 MG Tabs tablet Commonly known as:  BRILINTA     TAKE these medications   acetaminophen 500 MG tablet Commonly known as:  TYLENOL Take 1 tablet (500 mg total) by mouth every 6 (six) hours as needed (pain).   amLODipine 2.5 MG tablet Commonly known as:  NORVASC Take 1 tablet (2.5 mg total) daily by mouth. Start taking on:  04/14/2017   aspirin 81 MG EC tablet Take 1 tablet (81 mg total) by mouth daily.   Biotin 5 MG Tabs Take 5 mg by mouth daily with supper.   budesonide 0.25 MG/2ML nebulizer solution Commonly known as:  PULMICORT Take 0.25 mg by nebulization daily.   DULoxetine 30 MG capsule Commonly known as:  CYMBALTA Take 30 mg by mouth daily.   Flaxseed Oil 1000 MG Caps Take 1,000 mg by mouth 3 (three) times daily.   fluticasone 50 MCG/ACT nasal spray Commonly known as:  FLONASE Place 1 spray into both nostrils daily as needed for allergies or rhinitis.   gabapentin 100 MG capsule Commonly known as:  NEURONTIN Take 1 capsule (100 mg total) by mouth every morning. What changed:    how much to take  when to take this  additional instructions   latanoprost 0.005 % ophthalmic solution Commonly known as:  XALATAN Place 1 drop into both eyes at bedtime.   levalbuterol 45 MCG/ACT  inhaler Commonly known as:  XOPENEX HFA Inhale 2 puffs into the lungs every 4 (four) hours as needed for wheezing.   loratadine 10 MG tablet Commonly known as:  CLARITIN Take 10 mg by mouth daily.   multivitamin with minerals Tabs tablet Take 1 tablet by mouth daily.   pantoprazole 40 MG tablet Commonly known as:  PROTONIX Take 1 tablet (40 mg total) 2 (two) times daily by mouth.   senna-docusate 8.6-50 MG tablet Commonly known as:  Senokot-S Take 1 tablet by mouth at  bedtime. Available over the counter What changed:    how much to take  additional instructions   sodium chloride 0.65 % Soln nasal spray Commonly known as:  OCEAN Place 1 spray into both nostrils daily as needed for congestion.   SYSTANE ULTRA OP Place 1 drop into both eyes 2 (two) times daily as needed (dry eyes).   traMADol 50 MG tablet Commonly known as:  ULTRAM Take 0.5 tablets (25 mg total) by mouth 2 (two) times daily as needed (pain).   Vitamin D 2000 units Caps Take 2,000 Units by mouth daily.       Disposition and follow-up:   [name] was discharged from [facility] in Stable condition.  At the hospital follow up visit please address:  1.  Please assess for ongoing symptoms of upper GI bleed (hematemesis, melena). Please check a CBC to assess for further blood loss.   2.  Labs / imaging needed at time of follow-up: CBC   3.  Pending labs/ test needing follow-up: None   Follow-up Appointments: Follow-up Information    Lynn Ito, MD. Schedule an appointment as soon as possible for a visit in 5 day(s).   Specialty:  Internal Medicine Why:  Please call and make an hospital follow up appointment within the next 7 days  Contact information: [address] [place] [address] [phone]           Hospital Course by problem list:  Carolyn Werner 83 year old female with PMH significant for at least 2 left sided CVAs (on DAPT with ASA/Brillinta), Left ICA Stenosis (80-99%), PVD s/p Right AKA, T2DM, HTN, HLD, and asthma who presents with suspected acute upper GI bleed.  1. Upper GI Bleed:Patient presented to the ED with 1-day history of hematemesis and syncopal episode in the setting of aspirin and Brilinta use for secondary stroke prevention. Hgb 12.6 (at baseline) and HDS on presentation. She continued to actively bleed during admission with Hgb downtrending to 9.2 and required 2 blood transfusions. GI  consulted who recommended no intervention given patient's age and comorbidities.Therefore she was managed with conservative therapy including IV fluids and blood transfusions. Hgb 12 on day of discharge. She was seen by palliative care team while admitted. Patient's goal is comfort and quality of time. For now they desire repeat blood transfusion as needed and supportive care. Desire for DNR confirmed. No invasive/aggressive diagnostics or interventions desired.   2. History of CVA: Patient has a history of CVA x2 secondary to left ICA stenosis for which patient declined treatment.  She was on aspirin and Brilinta on admission.  Per discussion with patient and with patient's daughter, they would like patient to restart aspirin only.  They would like to stop Brilinta in the setting of this new episode of upper GI bleed. ASA 81 mg restarted on day of discharge.   3. Thrombocytopenia: Mild thrombocytopenia noted during this admission.  Platelet count as  low as 110.  This is likely secondary to upper GI bleed. Platelets uptrended after resolution of GI bleed and were 159 on day of discharge.   4. Leukocytosis: Initial WBC 14 on presentation that uptrended to 25 during admission. Patient with no active signs and symptoms of infection. This was monitored during admission and resolved without intervention.   5. HTN: Patient's BP persistently elevated during admission likely secondary to IVF. She was discharged on amlodipine 2.5 mg daily 3 weeks ago and it is unclear why it was stopped. Amlodipine 2.5 mg daily was restarted on day of discharge.    Discharge Vitals:   BP (!) 144/60   Pulse 81   Temp 98 F (36.7 C) (Oral)   Resp 16   Wt 120 lb (54.4 kg)   SpO2 100%   BMI 20.60 kg/m   Pertinent Labs, Studies, and Procedures:   CXR: IMPRESSION: Mild scarring left base. No edema or consolidation. There is aortic atherosclerosis.  Discharge Instructions: Discharge Instructions    Call MD for:   persistant dizziness or light-headedness   Complete by:  As directed    Diet general   Complete by:  As directed    Increase activity slowly   Complete by:  As directed       Signed: Burna CashIdalys Santos-Sanchez, MD  Internal Medicine PGY-1  P [phone]

## 2017-04-14 LAB — CULTURE, BLOOD (ROUTINE X 2)
Culture: NO GROWTH
Culture: NO GROWTH
SPECIAL REQUESTS: ADEQUATE
Special Requests: ADEQUATE

## 2017-04-20 IMAGING — DX DG CHEST 2V
2 series · 2 of 2 positions shown · non-contrast
Comparison: None.

CLINICAL DATA: Acute onset of vertigo and generalized weakness.
Initial encounter.

EXAM:
CHEST  2 VIEW

[chest pa]
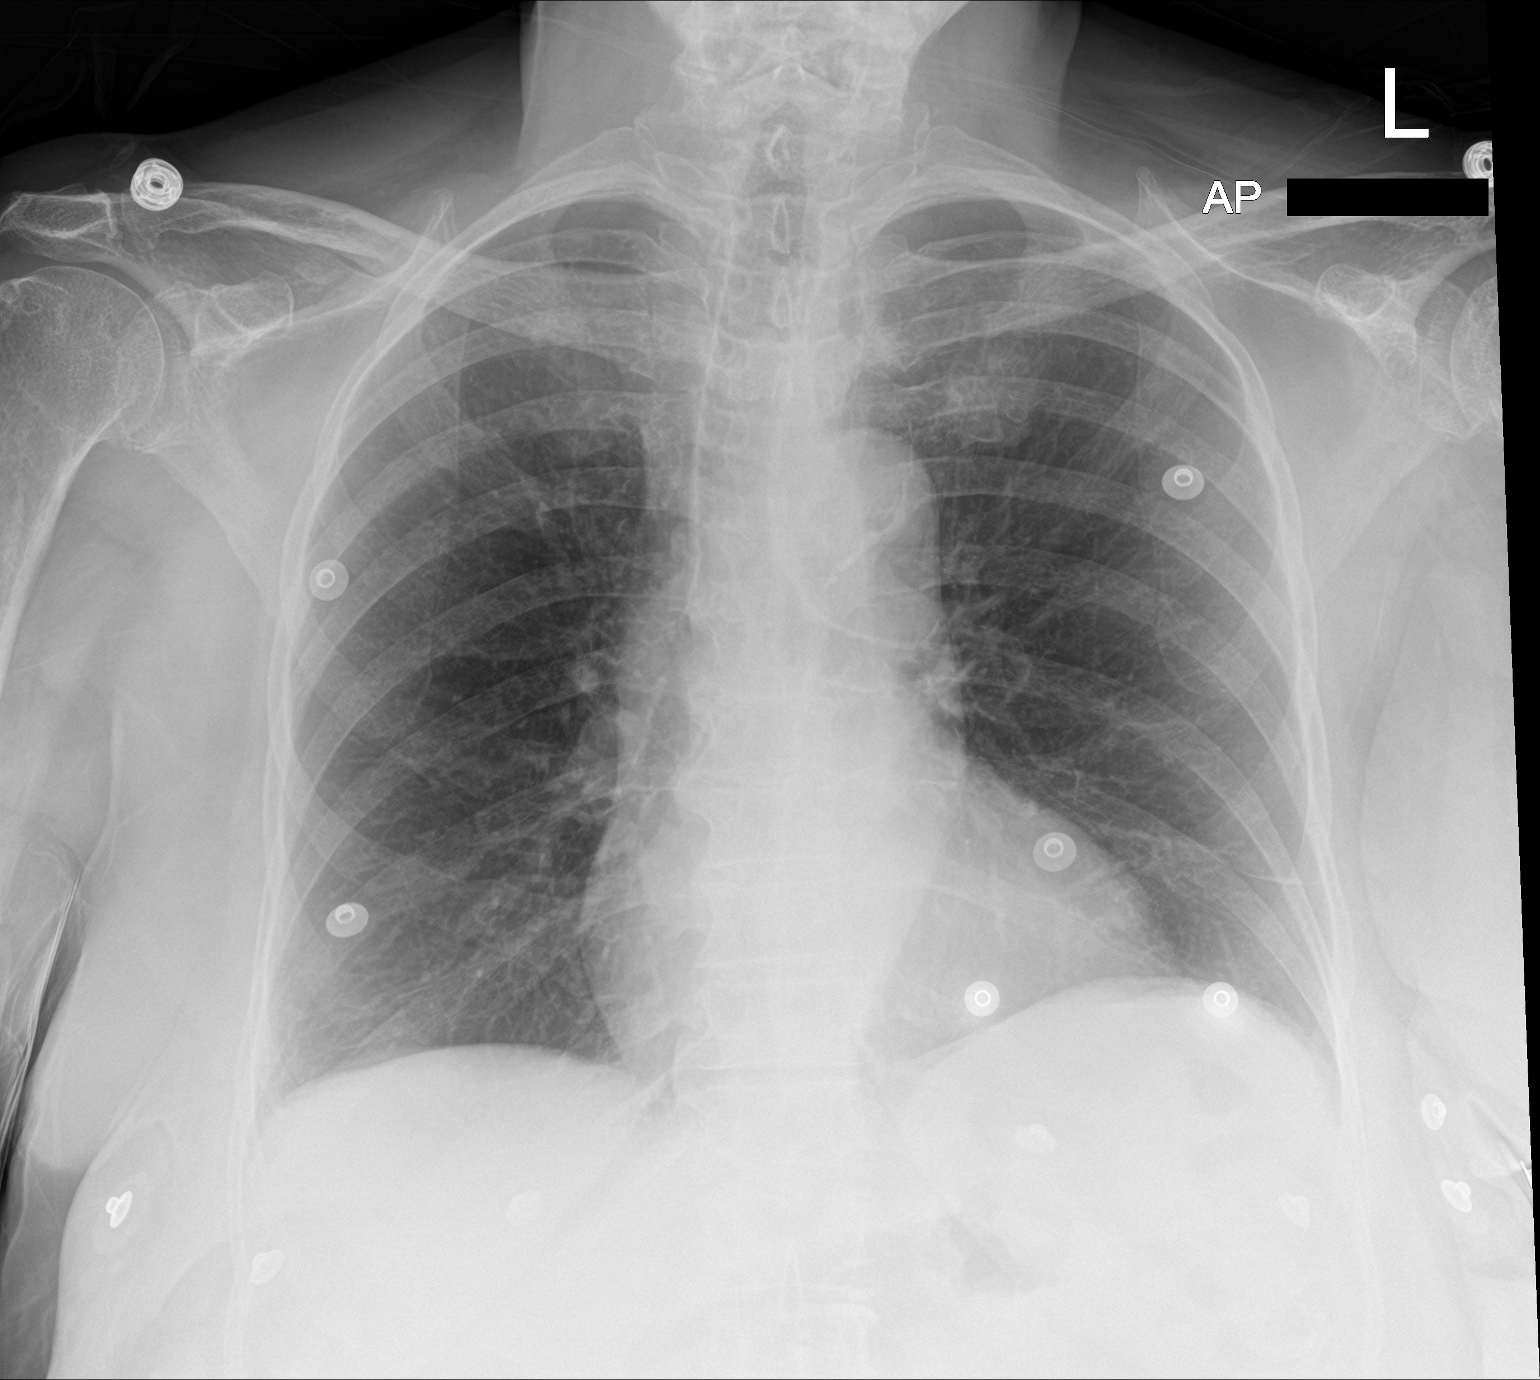

[chest lat]
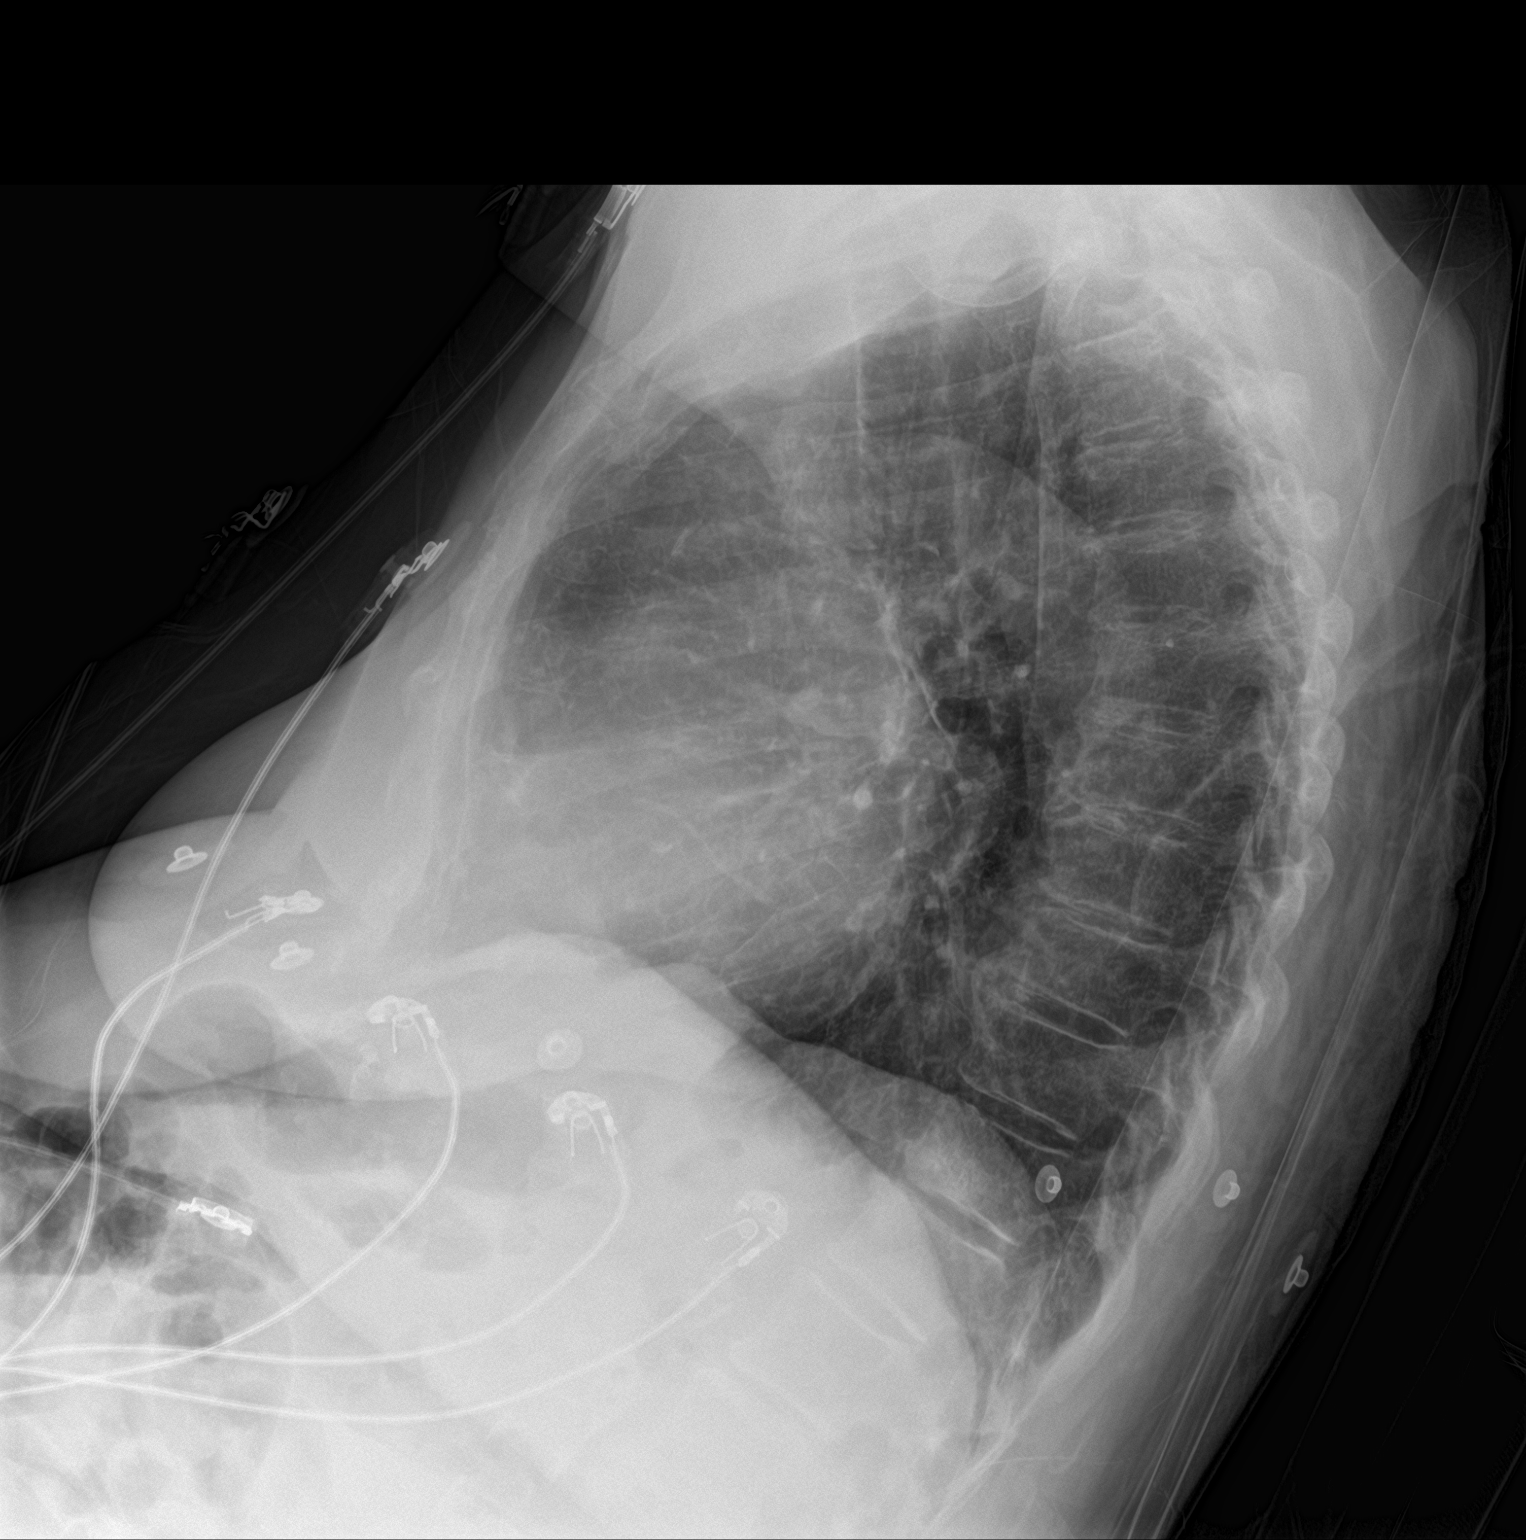

[2 of 2 positions shown; findings below may reference images not displayed]

FINDINGS: The lungs are well-aerated and clear. There is no evidence of focal
opacification, pleural effusion or pneumothorax.

The heart is normal in size; the mediastinal contour is within
normal limits. No acute osseous abnormalities are seen.
IMPRESSION: No acute cardiopulmonary process seen.

## 2017-05-09 ENCOUNTER — Ambulatory Visit: Payer: Medicare Other | Admitting: Cardiovascular Disease

## 2017-08-01 NOTE — Telephone Encounter (Signed)
Error

## 2017-08-21 ENCOUNTER — Non-Acute Institutional Stay (SKILLED_NURSING_FACILITY): Admitting: Internal Medicine

## 2017-08-21 ENCOUNTER — Encounter: Payer: Self-pay | Admitting: Internal Medicine

## 2017-08-21 DIAGNOSIS — Z8673 Personal history of transient ischemic attack (TIA), and cerebral infarction without residual deficits: Secondary | ICD-10-CM

## 2017-08-21 DIAGNOSIS — I10 Essential (primary) hypertension: Secondary | ICD-10-CM

## 2017-08-21 DIAGNOSIS — J4530 Mild persistent asthma, uncomplicated: Secondary | ICD-10-CM

## 2017-08-21 DIAGNOSIS — E1151 Type 2 diabetes mellitus with diabetic peripheral angiopathy without gangrene: Secondary | ICD-10-CM | POA: Diagnosis not present

## 2017-08-21 NOTE — Progress Notes (Signed)
NURSING HOME LOCATION:  [facility] ROOM NUMBER:  [id number]  CODE STATUS:  DNR  PCP:  Lynn Ito, MD  [address] [place] [address]   This is a Respite admission note to [facility] performed on this date  Included are preadmission medical/surgical history;reconciled medication list; family history; social history and review of systems.  Corrections and additions to the records were documented. Comprehensive physical exam was also performed. Additionally a clinical summary was entered for each active diagnosis pertinent to this admission in the Problem List to enhance continuity of care.  HPI: Patient is a 84 year old female with diagnoses of  previous cerebral infarction X2 ( she states "90% blockage of carotid artery"), anxiety/ depression, reactive airways disease, peripheral neuropathy, essential hypertension, and glaucoma.  Hospice records indicate that the patient is alert and oriented but wheelchair-bound. She is able to transfer herself and is continent  of bowel and urine. She has chronic pain which is managed with tramadol and Tylenol.  Past medical and surgical history: Includes IBS, pituitary gland nodule, peripheral vascular disease, diabetes, mild normochromic/normocytic anemia, cervical degenerative disc disease, and chronic kidney disease stage III. RAD began after apparent mold exposure in her apartment > 6 years ago. She had no PMH of asthma. First CVA treated with Aggrenox but at recurrence Brillinta initiated. GI bleed necessitated D/C of novel anticoagulant.  Surgeries include pituitary lesion excision, bypass grafting, an AKA on the right for PVD.  Social history: Quit smoking in 1975, nondrinker.Retired Engineer, civil (consulting) Family history: Reviewed, noncontributory due to age   Review of systems: Patient has history of dizziness related to lateral rotation of the head in the context of carotid stenosis.Non productive cough is essentially  controlled by bid maintenance neb treatments. Last A!c 6.7%; random glucoses 107 & 117. Constitutional: No fever,significant weight change, fatigue  Eyes: No redness, discharge, pain, vision change ENT/mouth: No nasal congestion, purulent discharge, earache, change in hearing, sore throat  Cardiovascular: No chest pain, palpitations, paroxysmal nocturnal dyspnea, claudication, edema  Respiratory: No hemoptysis, significant snoring, apnea Gastrointestinal: No heartburn, dysphagia, abdominal pain, nausea /vomiting, rectal bleeding, melena, change in bowels Genitourinary: No dysuria, hematuria, pyuria, incontinence, nocturia Musculoskeletal: No joint stiffness, joint swelling, weakness, pain Dermatologic: No rash, pruritus, change in appearance of skin Neurologic: No headache, syncope, seizures, numbness, tingling Psychiatric: No significant anxiety, depression, insomnia, anorexia Endocrine: No change in hair/skin/ nails, excessive thirst, excessive hunger, excessive urination  Hematologic/lymphatic: No significant bruising, lymphadenopathy, abnormal bleeding Allergy/immunology: No itchy/watery eyes, significant sneezing, urticaria, angioedema  Physical exam:  Pertinent or positive findings:Articulate, communicative & fully oriented. Appears much younger than stated age.HOH despite aids.Arcus senilis & slight OD exotropia present.Inspiratory pops, low grade rhonchi  & wheezes , greatest at bases.L> R carotid bruits.R AKA. Weaker RUE & R thigh vs LUE & L thigh.L DPP decreased> PTP. Isolated DIP OA changes.  General appearance: Adequately nourished; no acute distress, increased work of breathing is present.   Lymphatic: No lymphadenopathy about the head, neck, axilla. Eyes: No conjunctival inflammation or lid edema is present. There is no scleral icterus. Ears:  External ear exam shows no significant lesions or deformities.   Nose:  External nasal examination shows no deformity or inflammation.  Nasal mucosa are pink and moist without lesions, exudates Oral exam: Lips and gums are healthy appearing.There is no oropharyngeal erythema or exudate. Neck:  No thyromegaly, masses, tenderness noted.    Heart:  Normal rate and regular rhythm. S1 and S2 normal without  gallop, murmur, click, rub .  Abdomen: Bowel sounds are normal.  Abdomen is soft and nontender with no organomegaly, hernias, masses. GU: Deferred  Extremities:  No cyanosis, clubbing, edema  Neurologic exam:   Balance, Rhomberg, finger to nose testing could not be completed due to clinical state Skin: Warm & dry w/o tenting. No significant lesions or rash.  See clinical summary under each active problem in the Problem List with associated updated therapeutic plan

## 2017-08-22 NOTE — Assessment & Plan Note (Signed)
An  A1c of 8% or less  is the safest goal with her age & co-morbidities. Self reported A1c 6.7% w/o meds. It is critical to prevent hypoglycemia which would mimic TIA.

## 2017-08-22 NOTE — Assessment & Plan Note (Signed)
Continue maintenance pulmonary toilet

## 2017-08-22 NOTE — Assessment & Plan Note (Signed)
Continue low dose ASA

## 2017-08-22 NOTE — Assessment & Plan Note (Signed)
BP controlled w/o antihypertensive medications  

## 2017-08-22 NOTE — Patient Instructions (Signed)
See assessment and plan under each diagnosis in the problem list and acutely for this visit 

## 2017-08-27 ENCOUNTER — Other Ambulatory Visit: Payer: Self-pay

## 2017-08-27 ENCOUNTER — Emergency Department (HOSPITAL_COMMUNITY)

## 2017-08-27 ENCOUNTER — Encounter (HOSPITAL_COMMUNITY): Payer: Self-pay | Admitting: Emergency Medicine

## 2017-08-27 ENCOUNTER — Emergency Department (HOSPITAL_COMMUNITY)
Admission: EM | Admit: 2017-08-27 | Discharge: 2017-08-27 | Disposition: A | Discharge status: Home / Self Care | Attending: Physician Assistant | Admitting: Physician Assistant

## 2017-08-27 DIAGNOSIS — Z79899 Other long term (current) drug therapy: Secondary | ICD-10-CM | POA: Diagnosis not present

## 2017-08-27 DIAGNOSIS — R000 Tachycardia, unspecified: Secondary | ICD-10-CM | POA: Diagnosis present

## 2017-08-27 DIAGNOSIS — Z7982 Long term (current) use of aspirin: Secondary | ICD-10-CM | POA: Diagnosis not present

## 2017-08-27 DIAGNOSIS — N183 Chronic kidney disease, stage 3 (moderate): Secondary | ICD-10-CM | POA: Insufficient documentation

## 2017-08-27 DIAGNOSIS — E119 Type 2 diabetes mellitus without complications: Secondary | ICD-10-CM | POA: Insufficient documentation

## 2017-08-27 DIAGNOSIS — Z87891 Personal history of nicotine dependence: Secondary | ICD-10-CM | POA: Insufficient documentation

## 2017-08-27 DIAGNOSIS — I471 Supraventricular tachycardia: Secondary | ICD-10-CM | POA: Diagnosis not present

## 2017-08-27 LAB — BASIC METABOLIC PANEL
Anion gap: 10 (ref 5–15)
BUN: 13 mg/dL (ref 6–20)
CO2: 23 mmol/L (ref 22–32)
CREATININE: 1.19 mg/dL — AB (ref 0.44–1.00)
Calcium: 9.9 mg/dL (ref 8.9–10.3)
Chloride: 101 mmol/L (ref 101–111)
GFR calc Af Amer: 44 mL/min — ABNORMAL LOW (ref >60)
GFR calc non Af Amer: 38 mL/min — ABNORMAL LOW (ref >60)
GLUCOSE: 232 mg/dL — AB (ref 65–99)
Potassium: 3.6 mmol/L (ref 3.5–5.1)
SODIUM: 134 mmol/L — AB (ref 135–145)

## 2017-08-27 LAB — CBC
HEMATOCRIT: 40.9 % (ref 36.0–46.0)
Hemoglobin: 13.5 g/dL (ref 12.0–15.0)
MCH: 28.8 pg (ref 26.0–34.0)
MCHC: 33 g/dL (ref 30.0–36.0)
MCV: 87.4 fL (ref 78.0–100.0)
Platelets: 150 10*3/uL (ref 150–400)
RBC: 4.68 MIL/uL (ref 3.87–5.11)
RDW: 12.3 % (ref 11.5–15.5)
WBC: 7.9 10*3/uL (ref 4.0–10.5)

## 2017-08-27 LAB — I-STAT TROPONIN, ED: TROPONIN I, POC: 0 ng/mL (ref 0.00–0.08)

## 2017-08-27 MED ORDER — RESOURCE THICKENUP CLEAR PO POWD
Freq: Once | ORAL | Status: AC
  Administered 2017-08-27: 02:00:00 via ORAL
  Filled 2017-08-27: qty 125

## 2017-08-27 NOTE — Discharge Instructions (Addendum)
Please follow up with family doctor as needed. Return if worsening symptoms.  °

## 2017-08-27 NOTE — ED Triage Notes (Signed)
Per EMS pt from home. Hospice pt. On respite care.  Pt is alert and oriented x 4.  Was going to bed around 2315 and "felt funny".  Heart racing.  Slight headache. No chest pain. No vision changes.  HR 150 on scene.  130 currently.  324 ASA given en route.  Bp 170/100.  Pt BP meds were discontinued approximately 1 year ago.  Right above knee amputation.

## 2017-08-27 NOTE — ED Provider Notes (Addendum)
[facility] EMERGENCY DEPARTMENT Provider Note   CSN: [id number] Arrival date & time: 08/27/17  0021     History   Chief Complaint Chief Complaint  Patient presents with  . Tachycardia    HPI Cereniti Curb is a 84 y.o. female.  HPI Tiphany Fayson is a 84 y.o. female presents to ED with complaint of palpitations.  Patient states that she was getting ready to go to bed this evening and started feeling like her heart was racing and felt "funny."  She denies ever having chest pain.  She did get dizzy, did become short of breath.  She states that nothing was making her symptoms better or worse.  She denies history of the same.Patient is currently on Hospice.  Was taken off of her blood pressure medications last week.  She states that she is feeling better at this time. No other complaints  Past Medical History:  Diagnosis Date  . Abdominal hernia   . Cataracts, both eyes   . CKD (chronic kidney disease), stage III ([name])   . Complication of anesthesia    CONFUSION   . DDD (degenerative disc disease), cervical   . Diabetes mellitus without complication ([name])   . Headache   . Hx of benign neoplasm of pituitary gland   . IBS (irritable colon syndrome)-C   . Mild persistent asthma   . PVD (peripheral vascular disease) ([name])   . Shingles   . Stroke [facility])     Patient Active Problem List   Diagnosis Date Noted  . Goals of care, counseling/discussion   . Palliative care encounter   . Acute upper GI bleed 04/09/2017  . Syncope, vasovagal 03/21/2017  . Carotid stenosis 03/21/2017  . S/P AKA (above knee amputation) unilateral, right ([name])   . Irritable bowel syndrome   . Diabetic peripheral neuropathy ([name])   . Embolic stroke ([name]) 01/30/2017  . Ataxia, post-stroke   . DM (diabetes mellitus), type 2 with peripheral vascular complications ([name])   . Stage 3 chronic kidney disease ([name])   . History of right above knee amputation ([name])   . History of  stroke   . Benign essential HTN   . Transient cerebral ischemia   . Cerebral thrombosis with cerebral infarction 01/28/2017  . Right arm weakness 01/26/2017  . Weakness 01/26/2017  . Hyperlipemia 10/05/2016  . Weakness of both arms   . Left pontine stroke ([name]) 07/28/2016  . 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 Slurred speech 07/27/2016  . CKD (chronic kidney disease) stage 3, GFR 30-59 ml/min ([name]) 07/27/2016  . Essential hypertension 07/27/2016  . Depression 07/27/2016  . Glaucoma 07/27/2016  . Neuropathic pain 07/27/2016  . Asthma 07/27/2016  . Heart palpitations 07/05/2016  . Cardiac murmur 07/05/2016  . Phantom limb pain ([name]) 11/13/2014  . Cervical disc disorder with radiculopathy of cervical region 11/13/2014    Past Surgical History:  Procedure Laterality Date  . ABOVE KNEE LEG AMPUTATION Right 2013  . APPENDECTOMY    . BYPASS GRAFT     ileofemoral   . PITUITARY EXCISION    . TONSILLECTOMY       OB History   None      Home Medications    Prior to Admission medications   Medication Sig Start Date End Date Taking? Authorizing Provider  acetaminophen (TYLENOL) 500 MG tablet Take 1 tablet (500 mg total) by mouth every 6 (six) hours as needed (pain). 02/07/17   Love,  Evlyn Kanner, PA-C  aspirin EC 81 MG EC tablet Take 1 tablet (81 mg total) by mouth daily. 08/01/16   Valentino Nose, MD  Biotin 1000 MCG tablet Take 1,000 mcg by mouth daily.    [provider]  budesonide (PULMICORT) 0.25 MG/2ML nebulizer solution Take 0.25 mg by nebulization 2 (two) times daily.     [provider]  cholecalciferol (VITAMIN D) 1000 units tablet Take 1,000 Units by mouth daily.    [provider]  DULoxetine (CYMBALTA) 30 MG capsule Take 30 mg by mouth daily.    [provider]  Flaxseed, Linseed, (FLAXSEED OIL) 1000 MG CAPS Take 1,000 mg by mouth 3 (three) times daily.    [provider]  gabapentin  (NEURONTIN) 100 MG capsule Take 1 capsule (100 mg total) by mouth every morning. 08/01/16   Valentino Nose, MD  gabapentin (NEURONTIN) 100 MG capsule Take 200 mg by mouth at bedtime. Take 2 capsules to = 200 mg    [provider]  latanoprost (XALATAN) 0.005 % ophthalmic solution Place 1 drop into both eyes at bedtime.    [provider]  levalbuterol Pauline Aus HFA) 45 MCG/ACT inhaler Inhale 2 puffs into the lungs every 4 (four) hours as needed for wheezing.    [provider]  loratadine (CLARITIN) 10 MG tablet Take 10 mg by mouth daily.     [provider]  LORazepam (ATIVAN) 0.5 MG tablet Take 0.5 mg by mouth every 4 (four) hours as needed for anxiety.    [provider]  Multiple Vitamin (MULTIVITAMIN WITH MINERALS) TABS tablet Take 1 tablet by mouth daily.    [provider]  senna-docusate (SENOKOT-S) 8.6-50 MG tablet Take 2 tablets by mouth 2 (two) times daily as needed for mild constipation.    [provider]  sorbitol 70 % solution Take 30 mLs by mouth daily as needed (Constipation).    [provider]  traMADol (ULTRAM) 50 MG tablet Take 50 mg by mouth 2 (two) times daily.    [provider]    Family History Family History  Problem Relation Age of Onset  . Dementia Unknown   . Hypertension Daughter     Social History Social History   Tobacco Use  . Smoking status: Former Smoker    Packs/day: 0.25    Years: 23.00    Pack years: 5.75    Last attempt to quit: 06/05/1973    Years since quitting: 44.2  . Smokeless tobacco: Never Used  Substance Use Topics  . Alcohol use: No    Alcohol/week: 0.0 oz  . Drug use: No     Allergies   Atacand hct [candesartan cilexetil-hctz]; Carbamazepine; Erythromycin; Glucophage [metformin hcl]; Lactose intolerance (gi); Lipitor [atorvastatin]; Nsaids; Penicillins; Sulfa antibiotics; Ceclor [cefaclor]; Clindamycin/lincomycin; Milk-related compounds; Rosuvastatin  calcium; Dilaudid [hydromorphone hcl]; and Plavix [clopidogrel]   Review of Systems Review of Systems  Constitutional: Negative for chills and fever.  Respiratory: Positive for shortness of breath. Negative for cough and chest tightness.   Cardiovascular: Positive for palpitations. Negative for chest pain and leg swelling.  Gastrointestinal: Negative for abdominal pain, diarrhea, nausea and vomiting.  Genitourinary: Negative for dysuria, flank pain and pelvic pain.  Musculoskeletal: Negative for arthralgias, myalgias, neck pain and neck stiffness.  Skin: Negative for rash.  Neurological: Positive for dizziness and light-headedness. Negative for weakness and headaches.  All other systems reviewed and are negative.    Physical Exam Updated Vital Signs BP (!) 173/86 (BP Location: Left  Arm)   Pulse (!) 140   Temp 98.6 F (37 C) (Oral)   Resp 20   SpO2 98%   Physical Exam  Constitutional: She appears well-developed and well-nourished. No distress.  HENT:  Head: Normocephalic.  Eyes: Conjunctivae are normal.  Neck: Neck supple.  Cardiovascular: Normal rate, regular rhythm and normal heart sounds.  Pulmonary/Chest: Effort normal and breath sounds normal. No respiratory distress. She has no wheezes. She has no rales.  Abdominal: Soft. Bowel sounds are normal. She exhibits no distension. There is no tenderness. There is no rebound.  Musculoskeletal: She exhibits no edema.  Neurological: She is alert.  Skin: Skin is warm and dry.  Psychiatric: She has a normal mood and affect. Her behavior is normal.  Nursing note and vitals reviewed.    ED Treatments / Results  Labs (all labs ordered are listed, but only abnormal results are displayed) Labs Reviewed  BASIC METABOLIC PANEL - Abnormal; Notable for the following components:      Result Value   Sodium 134 (*)    Glucose, Bld 232 (*)    Creatinine, Ser 1.19 (*)    GFR calc non Af Amer 38 (*)    GFR calc Af Amer 44 (*)    All  other components within normal limits  CBC  I-STAT TROPONIN, ED    EKG EKG Interpretation  Date/Time:  Monday August 27 2017 00:40:39 EDT Ventricular Rate:  139 PR Interval:    QRS Duration: 94 QT Interval:  330 QTC Calculation: 502 R Axis:   18 Text Interpretation:  Since last tracing rate faster Confirmed by Bary CastillaMackuen, Courteney ([id number]) on 08/27/2017 6:17:42 AM   Radiology Dg Chest 2 View  Result Date: 08/27/2017 CLINICAL DATA:  84 year old female with chest pain and tachycardia. EXAM: CHEST - 2 VIEW COMPARISON:  04/09/2017 and earlier. FINDINGS: Lung volumes are stable and within normal limits. Stable mild tortuosity of the thoracic aorta. Other mediastinal contours are within normal limits. No pneumothorax, pulmonary edema, pleural effusion or confluent pulmonary opacity. No acute osseous abnormality identified. Visualized tracheal air column is within normal limits. Negative visible bowel gas pattern. IMPRESSION: No acute cardiopulmonary abnormality. Electronically Signed   By: Odessa FlemingH  Hall M.D.   On: 08/27/2017 01:21    Procedures Procedures (including critical care time)  Medications Ordered in ED Medications - No data to display   Initial Impression / Assessment and Plan / ED Course  I have reviewed the triage vital signs and the nursing notes.  Pertinent labs & imaging results that were available during my care of the patient were reviewed by me and considered in my medical decision making (see chart for details).     Patient was on onset of tachycardia and palpitations before going to bed tonight.  She does admit to not drinking as much fluids recently, however denies any new medications, caffeine, or any other complaints.  She is currently asymptomatic.  1:30 AM Pt spontaneously converted after she had blood drawn. Labs, xray pending.   3:48 AM Pt monitored for 3+hrs. HR remains in80s.  Blood work showed hyperglycemia, otherwise unremarkable.  Patient stated that she had  sweet potato and brownie just prior to the arrival to the hospital.  Discussed results with patient and her family.  I do not think patient needs admission, she will be discharged home.  Advised to follow-up closely with her doctor.  Vitals:   08/27/17 0245 08/27/17 0300 08/27/17 0315 08/27/17 0330  BP: (!) 161/112 (!) 150/88 Marland Kitchen(!)  153/70 (!) 155/69  Pulse: 82 80 80 82  Resp: 18 (!) 21 15 (!) 21  Temp:      TempSrc:      SpO2: 97% 96% 98% 97%     Final Clinical Impressions(s) / ED Diagnoses   Final diagnoses:  SVT (supraventricular tachycardia) [facility])    ED Discharge Orders    None       Jaynie Crumble, PA-C 08/27/17 0618    Jaynie Crumble, PA-C 08/27/17 0618    Abelino Derrick, MD 08/29/17 1606

## 2017-08-27 NOTE — ED Notes (Signed)
When EMT obtained labs from pt with straight stick the pt's heart rate converted to normal sinus in the 80s down from 140

## 2017-11-29 ENCOUNTER — Non-Acute Institutional Stay (SKILLED_NURSING_FACILITY): Admitting: Internal Medicine

## 2017-11-29 ENCOUNTER — Encounter: Payer: Self-pay | Admitting: Internal Medicine

## 2017-11-29 DIAGNOSIS — J4530 Mild persistent asthma, uncomplicated: Secondary | ICD-10-CM | POA: Diagnosis not present

## 2017-11-29 DIAGNOSIS — E1151 Type 2 diabetes mellitus with diabetic peripheral angiopathy without gangrene: Secondary | ICD-10-CM

## 2017-11-29 DIAGNOSIS — I6330 Cerebral infarction due to thrombosis of unspecified cerebral artery: Secondary | ICD-10-CM

## 2017-11-29 DIAGNOSIS — G8929 Other chronic pain: Secondary | ICD-10-CM | POA: Diagnosis not present

## 2017-11-29 DIAGNOSIS — M25511 Pain in right shoulder: Secondary | ICD-10-CM | POA: Diagnosis not present

## 2017-11-29 NOTE — Assessment & Plan Note (Signed)
No current A1c in Epic, most recent was 6.7%

## 2017-11-29 NOTE — Patient Instructions (Signed)
See assessment and plan under each diagnosis in the problem list and acutely for this visit 

## 2017-11-29 NOTE — Assessment & Plan Note (Addendum)
Minor clinical residual present Pain with elevation of the right upper extremity suggests rotator cuff syndrome rather than sequela of stroke Orthopedic assessment if progressive pain

## 2017-11-29 NOTE — Assessment & Plan Note (Addendum)
Exam suggests right rotator cuff syndrome She has Tylenol as well as  Tramadol and hydrocodone/acetaminophen ordered. Orthopedics could be consult if the symptoms progress or increase in intensity.

## 2017-11-29 NOTE — Assessment & Plan Note (Signed)
Her mild persistent asthma is clinically well controlled As needed pulmonary toilet will be continued

## 2017-11-29 NOTE — Progress Notes (Signed)
NURSING HOME LOCATION:  [facility] ROOM NUMBER:  [id number]  CODE STATUS:  DNR  PCP:  Lynn ItoSmith, Lori, MD [address] [place] [phone]  This is a comprehensive admission note to [facility] performed on this date less than 30 days from date of admission. Included are preadmission medical/surgical history; reconciled medication list; family history; social history and comprehensive review of systems.  Corrections and additions to the records were documented. Comprehensive physical exam was also performed. Additionally a clinical summary was entered for each active diagnosis pertinent to this admission in the Problem List to enhance continuity of care.  HPI: This is a respite admission for this 84 year old female with clinical diagnoses of prior cerebral infarction 2, reactive airways disease, peripheral neuropathy, glaucoma, essential hypertension, & history of anxiety/depression.  The first stroke was treated with Aggrenox with initiation of a novel anticoagulant with the second stroke. This had to be discontinued because of GI bleed.  Her last A1c was 6.9% on 01/26/17.  Past medical and surgical history: Her reactive airways disease was diagnosed as mild persistent asthma. She also has a history of irritable bowel syndrome. There is history of pituitary gland nodule with excision. She's had an AKA on the right because of PVD. She's also had ileofemoral bypass grafting.  Social history: She quit smoking in 1975. Nondrinker. She is a former Engineer, civil (consulting)nurse.  Family history: Noncontributory due to age   Review of systems:  She has chronic right shoulder pain which she relates to an injury in [id number]. She has pain with range of motion of the right shoulder. She has acetaminophen 500 mg every 6 hours as needed ordered for pain. The patient also has hydrocodone/acetaminophen every 4 hours as needed for severe pain. Were she to receive a maximum dose of both ordered pain  medicines there is a potential for 3950 mg of acetaminophen intake per day.She states she takes Tramadol & Tylenol bid with adequate pain relief.She rarely takes the narcotic pain med.  She had diarrhea several weeks ago which she relates to a "bug". This has resolved.  She describes some active depression because of deaths among family and friends in November 2018.  Constitutional: No fever, significant weight change, fatigue  Eyes: No redness, discharge, pain, vision change ENT/mouth: No nasal congestion, purulent discharge, earache, change in hearing, sore throat  Cardiovascular: No chest pain, palpitations, paroxysmal nocturnal dyspnea, claudication, edema  Respiratory: No cough, sputum production, hemoptysis, DOE, significant snoring, apnea Gastrointestinal: No heartburn, dysphagia, abdominal pain, nausea /vomiting, rectal bleeding, melena,active change in bowels Genitourinary: No dysuria, hematuria, pyuria, incontinence, nocturia Dermatologic: No rash, pruritus, change in appearance of skin Neurologic: No dizziness, headache, syncope, seizures, numbness, tingling Psychiatric: No significant anxiety,  insomnia, anorexia Endocrine: No change in hair/skin/nails, excessive thirst, excessive hunger, excessive urination  Hematologic/lymphatic: No significant bruising, lymphadenopathy, abnormal bleeding Allergy/immunology: No itchy/watery eyes, significant sneezing, urticaria, angioedema  Physical exam:  Pertinent or positive findings: She appears dramatically younger than her stated age. Slight ptosis bilaterally, greater on the right. This a brief grade 1/2 systolic murmur and slight increase in the second heart sound. AKA is present on the right. The left pedal pulses are decreased. She is diffusely weak, especially right upper extremity. There is limited elevation of the right shoulder. Interosseous wasting of the hands is present.  General appearance: Adequately nourished; no acute distress,  increased work of breathing is present.   Lymphatic: No lymphadenopathy about the head, neck, axilla. Eyes: No conjunctival  inflammation or lid edema is present. There is no scleral icterus. Ears:  External ear exam shows no significant lesions or deformities.   Nose:  External nasal examination shows no deformity or inflammation. Nasal mucosa are pink and moist without lesions, exudates Oral exam: Lips and gums are healthy appearing.There is no oropharyngeal erythema or exudate. Neck:  No thyromegaly, masses, tenderness noted.    Heart:  Normal rate and regular rhythm. S1 normal without gallop, click, rub.  Lungs: Chest clear to auscultation without wheezes, rhonchi, rales, rubs. Abdomen: Bowel sounds are normal.  Abdomen is soft and nontender with no organomegaly, hernias, masses. GU: Deferred  Extremities:  No cyanosis, clubbing, edema. Neurologic exam:  Deep tendon reflexes are equal Skin: Warm & dry w/o tenting. No significant lesions or rash.  See clinical summary under each active problem in the Problem List with associated updated therapeutic plan

## 2017-11-30 ENCOUNTER — Encounter: Payer: Self-pay | Admitting: Internal Medicine

## 2018-02-08 ENCOUNTER — Emergency Department (HOSPITAL_COMMUNITY): Payer: Medicare Other

## 2018-02-08 ENCOUNTER — Other Ambulatory Visit: Payer: Self-pay

## 2018-02-08 ENCOUNTER — Encounter (HOSPITAL_COMMUNITY): Payer: Self-pay | Admitting: Emergency Medicine

## 2018-02-08 ENCOUNTER — Emergency Department (HOSPITAL_COMMUNITY)
Admission: EM | Admit: 2018-02-08 | Discharge: 2018-02-08 | Disposition: A | Discharge status: Home / Self Care | Payer: Medicare Other | Attending: Emergency Medicine | Admitting: Emergency Medicine

## 2018-02-08 DIAGNOSIS — Z87891 Personal history of nicotine dependence: Secondary | ICD-10-CM | POA: Insufficient documentation

## 2018-02-08 DIAGNOSIS — Z7982 Long term (current) use of aspirin: Secondary | ICD-10-CM | POA: Diagnosis not present

## 2018-02-08 DIAGNOSIS — M868X7 Other osteomyelitis, ankle and foot: Secondary | ICD-10-CM | POA: Diagnosis not present

## 2018-02-08 DIAGNOSIS — J45909 Unspecified asthma, uncomplicated: Secondary | ICD-10-CM | POA: Diagnosis not present

## 2018-02-08 DIAGNOSIS — L089 Local infection of the skin and subcutaneous tissue, unspecified: Secondary | ICD-10-CM | POA: Diagnosis not present

## 2018-02-08 DIAGNOSIS — E1122 Type 2 diabetes mellitus with diabetic chronic kidney disease: Secondary | ICD-10-CM | POA: Insufficient documentation

## 2018-02-08 DIAGNOSIS — Z79899 Other long term (current) drug therapy: Secondary | ICD-10-CM | POA: Insufficient documentation

## 2018-02-08 DIAGNOSIS — N183 Chronic kidney disease, stage 3 (moderate): Secondary | ICD-10-CM | POA: Insufficient documentation

## 2018-02-08 DIAGNOSIS — I129 Hypertensive chronic kidney disease with stage 1 through stage 4 chronic kidney disease, or unspecified chronic kidney disease: Secondary | ICD-10-CM | POA: Insufficient documentation

## 2018-02-08 DIAGNOSIS — M79672 Pain in left foot: Secondary | ICD-10-CM | POA: Diagnosis present

## 2018-02-08 DIAGNOSIS — E1140 Type 2 diabetes mellitus with diabetic neuropathy, unspecified: Secondary | ICD-10-CM | POA: Diagnosis not present

## 2018-02-08 LAB — COMPREHENSIVE METABOLIC PANEL
ALK PHOS: 81 U/L (ref 38–126)
ALT: 15 U/L (ref 0–44)
ANION GAP: 9 (ref 5–15)
AST: 23 U/L (ref 15–41)
Albumin: 3.3 g/dL — ABNORMAL LOW (ref 3.5–5.0)
BILIRUBIN TOTAL: 0.7 mg/dL (ref 0.3–1.2)
BUN: 9 mg/dL (ref 8–23)
CO2: 24 mmol/L (ref 22–32)
CREATININE: 1.11 mg/dL — AB (ref 0.44–1.00)
Calcium: 10 mg/dL (ref 8.9–10.3)
Chloride: 103 mmol/L (ref 98–111)
GFR calc non Af Amer: 41 mL/min — ABNORMAL LOW (ref >60)
GFR, EST AFRICAN AMERICAN: 48 mL/min — AB (ref >60)
GLUCOSE: 107 mg/dL — AB (ref 70–99)
Potassium: 4.4 mmol/L (ref 3.5–5.1)
SODIUM: 136 mmol/L (ref 135–145)
TOTAL PROTEIN: 6.9 g/dL (ref 6.5–8.1)

## 2018-02-08 LAB — URINALYSIS, ROUTINE W REFLEX MICROSCOPIC
Bilirubin Urine: NEGATIVE
GLUCOSE, UA: NEGATIVE mg/dL
Hgb urine dipstick: NEGATIVE
KETONES UR: NEGATIVE mg/dL
Nitrite: NEGATIVE
PH: 6 (ref 5.0–8.0)
Protein, ur: NEGATIVE mg/dL
SPECIFIC GRAVITY, URINE: 1.013 (ref 1.005–1.030)

## 2018-02-08 LAB — CBC WITH DIFFERENTIAL/PLATELET
Abs Immature Granulocytes: 0 10*3/uL (ref 0.0–0.1)
BASOS ABS: 0.1 10*3/uL (ref 0.0–0.1)
BASOS PCT: 1 %
EOS ABS: 0.1 10*3/uL (ref 0.0–0.7)
Eosinophils Relative: 2 %
HCT: 42.6 % (ref 36.0–46.0)
Hemoglobin: 13.4 g/dL (ref 12.0–15.0)
Immature Granulocytes: 0 %
Lymphocytes Relative: 29 %
Lymphs Abs: 1.8 10*3/uL (ref 0.7–4.0)
MCH: 28.8 pg (ref 26.0–34.0)
MCHC: 31.5 g/dL (ref 30.0–36.0)
MCV: 91.4 fL (ref 78.0–100.0)
MONO ABS: 0.4 10*3/uL (ref 0.1–1.0)
MONOS PCT: 7 %
NEUTROS PCT: 61 %
Neutro Abs: 3.8 10*3/uL (ref 1.7–7.7)
Platelets: 142 10*3/uL — ABNORMAL LOW (ref 150–400)
RBC: 4.66 MIL/uL (ref 3.87–5.11)
RDW: 12.2 % (ref 11.5–15.5)
WBC: 6.3 10*3/uL (ref 4.0–10.5)

## 2018-02-08 LAB — CBG MONITORING, ED: Glucose-Capillary: 121 mg/dL — ABNORMAL HIGH (ref 70–99)

## 2018-02-08 LAB — I-STAT CG4 LACTIC ACID, ED: Lactic Acid, Venous: 1.45 mmol/L (ref 0.5–1.9)

## 2018-02-08 MED ORDER — DOXYCYCLINE HYCLATE 100 MG PO TABS
100.0000 mg | ORAL_TABLET | Freq: Once | ORAL | Status: AC
  Administered 2018-02-08: 100 mg via ORAL
  Filled 2018-02-08: qty 1

## 2018-02-08 MED ORDER — DOXYCYCLINE HYCLATE 100 MG PO CAPS: 100.0000 mg | ORAL_CAPSULE | Freq: Two times a day (BID) | ORAL | 0 refills | Status: AC

## 2018-02-08 NOTE — ED Notes (Signed)
Paged Dr. Romualdo Bolk (Podiatrist) for Carolyn Werner

## 2018-02-08 NOTE — ED Notes (Signed)
Patient left at this time with all belongings. 

## 2018-02-08 NOTE — ED Provider Notes (Signed)
[facility] EMERGENCY DEPARTMENT Provider Note   CSN: [id number] Arrival date & time: 02/08/18  1629     History   Chief Complaint Chief Complaint  Patient presents with  . Toe Pain  . Wound Infection    HPI Carolyn Werner is a 84 y.o. female.  84 year old female brought in by daughter for left second toe pain.  Patient has a history of medical problems including peripheral vascular disease, diabetes, right AKA (2013), and rotation of left first toe (2013).  Patient has had a wound on her left second toe for the past month or two, treated at home by hospice care nurse and wound care nurse, not improving.  Patient's daughter showed the pictures of the toe to the patient's PCP today, patient was unable to be seen by PCP due to difficulty with transportation, PCP recommended immediate evaluation in the ER.  Patient is in hospice care following 2 strokes and an abdominal bleed last year, lives at home with primary care provided by her daughter.  Patient states that she has pain in his toe mostly when she bears weight on the foot to transfer. No other complaints or concerns.      Past Medical History:  Diagnosis Date  . Abdominal hernia   . Cataracts, both eyes   . CKD (chronic kidney disease), stage III ([name])   . Complication of anesthesia    CONFUSION   . DDD (degenerative disc disease), cervical   . Diabetes mellitus without complication ([name])   . Headache   . Hx of benign neoplasm of pituitary gland   . IBS (irritable colon syndrome)-C   . Mild persistent asthma   . PVD (peripheral vascular disease) ([name])   . Shingles   . Stroke [facility])     Patient Active Problem List   Diagnosis Date Noted  . Chronic right shoulder pain 11/29/2017  . Goals of care, counseling/discussion   . Palliative care encounter   . Acute upper GI bleed 04/09/2017  . Syncope, vasovagal 03/21/2017  . Carotid stenosis 03/21/2017  . S/P AKA (above knee amputation) unilateral,  right ([name])   . Irritable bowel syndrome   . Diabetic peripheral neuropathy ([name])   . Embolic stroke ([name]) 01/30/2017  . Ataxia, post-stroke   . DM (diabetes mellitus), type 2 with peripheral vascular complications ([name])   . Stage 3 chronic kidney disease ([name])   . History of right above knee amputation ([name])   . History of stroke   . Benign essential HTN   . Transient cerebral ischemia   . Cerebral thrombosis with cerebral infarction 01/28/2017  . Right arm weakness 01/26/2017  . Weakness 01/26/2017  . Hyperlipemia 10/05/2016  . Weakness of both arms   . Left pontine stroke ([name]) 07/28/2016  . 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 Slurred speech 07/27/2016  . CKD (chronic kidney disease) stage 3, GFR 30-59 ml/min ([name]) 07/27/2016  . Essential hypertension 07/27/2016  . Depression 07/27/2016  . Glaucoma 07/27/2016  . Neuropathic pain 07/27/2016  . Asthma 07/27/2016  . Heart palpitations 07/05/2016  . Cardiac murmur 07/05/2016  . Phantom limb pain ([name]) 11/13/2014  . Cervical disc disorder with radiculopathy of cervical region 11/13/2014    Past Surgical History:  Procedure Laterality Date  . ABOVE KNEE LEG AMPUTATION Right 2013  . APPENDECTOMY    . BYPASS GRAFT     ileofemoral   . PITUITARY EXCISION    . TONSILLECTOMY  OB History   None      Home Medications    Prior to Admission medications   Medication Sig Start Date End Date Taking? Authorizing Provider  acetaminophen (TYLENOL) 500 MG tablet Take 1 tablet (500 mg total) by mouth every 6 (six) hours as needed (pain). 02/07/17   Love, Evlyn Kanner, PA-C  aspirin EC 81 MG EC tablet Take 1 tablet (81 mg total) by mouth daily. 08/01/16   Valentino Nose, MD  Biotin 1000 MCG tablet Take 1,000 mcg by mouth daily.    [provider]  budesonide (PULMICORT) 0.25 MG/2ML nebulizer solution Take 0.25 mg by nebulization 2 (two) times daily.     [provider]    cholecalciferol (VITAMIN D) 1000 units tablet Take 1,000 Units by mouth daily.    [provider]  doxycycline (VIBRAMYCIN) 100 MG capsule Take 1 capsule (100 mg total) by mouth 2 (two) times daily for 7 days. 02/08/18 02/15/18  Jeannie Fend, PA-C  DULoxetine (CYMBALTA) 30 MG capsule Take 30 mg by mouth daily.    [provider]  Flaxseed, Linseed, (FLAXSEED OIL) 1000 MG CAPS Take 1,000 mg by mouth 3 (three) times daily.     [provider]  gabapentin (NEURONTIN) 100 MG capsule Take 100-200 mg by mouth See admin instructions. Take 1 capsule in the morning and take 2 capsules at night    [provider]  HYDROCODONE-ACETAMINOPHEN PO Take 1 tablet by mouth every 4 (four) hours as needed (for pain).     [provider]  latanoprost (XALATAN) 0.005 % ophthalmic solution Place 1 drop into both eyes at bedtime.    [provider]  levalbuterol Pauline Aus HFA) 45 MCG/ACT inhaler Inhale 2 puffs into the lungs every 4 (four) hours as needed for wheezing.    [provider]  loratadine (CLARITIN) 10 MG tablet Take 10 mg by mouth daily.     [provider]  Multiple Vitamin (MULTIVITAMIN WITH MINERALS) TABS tablet Take 1 tablet by mouth daily.    [provider]  Polyethyl Glycol-Propyl Glycol (SYSTANE ULTRA) 0.4-0.3 % SOLN Apply 1 drop to eye 2 (two) times daily. Both eyes    [provider]  senna-docusate (SENOKOT-S) 8.6-50 MG tablet Take 2 tablets by mouth daily as needed for mild constipation.     [provider]  sorbitol 70 % solution Take 30 mLs by mouth daily as needed (If no BM x3 days).     [provider]  traMADol (ULTRAM) 50 MG tablet Take 50 mg by mouth 2 (two) times daily.    [provider]    Family History Family History  Problem Relation Age of Onset  . Dementia Unknown   . Hypertension Daughter     Social History Social History   Tobacco Use  . Smoking status:  Former Smoker    Packs/day: 0.25    Years: 23.00    Pack years: 5.75    Last attempt to quit: 06/05/1973    Years since quitting: 44.7  . Smokeless tobacco: Never Used  Substance Use Topics  . Alcohol use: No    Alcohol/week: 0.0 standard drinks  . Drug use: No     Allergies   Atacand hct [candesartan cilexetil-hctz]; Carbamazepine; Erythromycin; Glucophage [metformin hcl]; Lactose intolerance (gi); Lipitor [atorvastatin]; Nsaids; Penicillins; Sulfa antibiotics; Ceclor [cefaclor]; Clindamycin/lincomycin; Milk-related compounds; Rosuvastatin calcium; Dilaudid [hydromorphone hcl]; and Plavix [clopidogrel]   Review of Systems Review of Systems  Constitutional: Negative for chills and  fever.  Musculoskeletal: Positive for arthralgias.  Skin: Positive for wound.  Allergic/Immunologic: Positive for immunocompromised state.  Neurological: Negative for numbness.  Hematological: Does not bruise/bleed easily.  Psychiatric/Behavioral: Negative for confusion.  All other systems reviewed and are negative.    Physical Exam Updated Vital Signs BP (!) 199/77   Pulse 73   Temp 97.8 F (36.6 C) (Oral)   Resp 13   SpO2 99%   Physical Exam  Constitutional: She is oriented to person, place, and time. She appears well-developed and well-nourished. No distress.  HENT:  Head: Normocephalic and atraumatic.  Cardiovascular: Intact distal pulses.  Pulmonary/Chest: Effort normal.  Musculoskeletal: She exhibits tenderness. She exhibits no deformity.       Left foot: There is bony tenderness.       Feet:  Neurological: She is alert and oriented to person, place, and time.  Skin: Skin is warm and dry. She is not diaphoretic.  Psychiatric: She has a normal mood and affect. Her behavior is normal.  Nursing note and vitals reviewed.    ED Treatments / Results  Labs (all labs ordered are listed, but only abnormal results are displayed) Labs Reviewed  COMPREHENSIVE METABOLIC PANEL - Abnormal;  Notable for the following components:      Result Value   Glucose, Bld 107 (*)    Creatinine, Ser 1.11 (*)    Albumin 3.3 (*)    GFR calc non Af Amer 41 (*)    GFR calc Af Amer 48 (*)    All other components within normal limits  CBC WITH DIFFERENTIAL/PLATELET - Abnormal; Notable for the following components:   Platelets 142 (*)    All other components within normal limits  URINALYSIS, ROUTINE W REFLEX MICROSCOPIC - Abnormal; Notable for the following components:   Leukocytes, UA SMALL (*)    Bacteria, UA RARE (*)    All other components within normal limits  CBG MONITORING, ED - Abnormal; Notable for the following components:   Glucose-Capillary 121 (*)    All other components within normal limits  CULTURE, BLOOD (ROUTINE X 2)  I-STAT CG4 LACTIC ACID, ED    EKG None  Radiology Dg Foot Complete Left  Result Date: 02/08/2018 CLINICAL DATA:  LEFT foot and toe pain, wound, prior amputations EXAM: LEFT FOOT - COMPLETE 3+ VIEW COMPARISON:  None FINDINGS: Osseous demineralization. Prior amputation of first ray. Question bone destruction at the head of the proximal phalanx of the second toe extending into PIP joint, question osteomyelitis, unable to exclude involvement of the PIP joint. Remaining joint spaces preserved. No fracture or dislocation. No additional bone destruction. Diffuse soft tissue swelling, greatest at second toe. IMPRESSION: Prior amputation of the first ray. Questionable bone destruction at the head of the proximal phalanx of the LEFT second toe which could reflect osteomyelitis, unable to exclude involvement at the PIP joint; this could be better assessed by MR. Electronically Signed   By: Ulyses Southward M.D.   On: 02/08/2018 17:59    Procedures Procedures (including critical care time)  Medications Ordered in ED Medications  doxycycline (VIBRA-TABS) tablet 100 mg (100 mg Oral Given 02/08/18 2342)     Initial Impression / Assessment and Plan / ED Course  I have  reviewed the triage vital signs and the nursing notes.  Pertinent labs & imaging results that were available during my care of the patient were reviewed by me and considered in my medical decision making (see chart for details).  Clinical Course as of Feb 09 5  Fri Feb 08, 2018  [id number] 84 year old female with history of diabetes presents with left second toe infection.  Patient lives at home with her daughter, and hospice care after 2 strokes and an abdominal bleed last year.  Patient's primary complaint is pain in her foot which occurs when she bears weight on his foot to transfer from bed to wheelchair.  Patient and family are also concerned regarding infection in the toe.  X-ray concerning for osteomyelitis.  Discussed treatment options with patient and her daughter, patient would prefer to be discharged today with antibiotics.  Case discussed with Dr. Romualdo Bolk, patient's podiatrist, agrees with plan to treat with p.o. doxycycline x7 days with recheck in the office in the next week.  Patient and daughter are agreeable with this plan.  Patient has pain medications at home that she will take to manage her pain with plan to contact their hospice doctor for longer lasting pain medications.  Patient and her daughter agree she is not a good surgical candidate, they would prefer to avoid long term IV antibiotics at this time. Patient has a list of antibiotics she prefers/tolerates best (Avelox, Levaquin, Doxyxycline, Cipro, Zithromax), discussed options with Dr. Romualdo Bolk who agrees with Doxycycline.    [LM]    Clinical Course User Index [LM] Jeannie Fend, PA-C    Final Clinical Impressions(s) / ED Diagnoses   Final diagnoses:  Toe infection  Other osteomyelitis of left foot [facility])    ED Discharge Orders         Ordered    doxycycline (VIBRAMYCIN) 100 MG capsule  2 times daily     02/08/18 2335           Jeannie Fend, PA-C 02/09/18 0007    Maia Plan, MD 02/09/18 [phone]

## 2018-02-08 NOTE — ED Triage Notes (Signed)
Pt presents with L foot/toe pain with wound noted; pt hx of amputations; wound has been managed by hospice and wound care but not improving;

## 2018-02-08 NOTE — ED Notes (Signed)
Updated on wait for treatment room. 

## 2018-02-08 NOTE — Discharge Instructions (Addendum)
Take doxycycline as prescribed.  Apply dry dressings to the toe.  Follow-up with Dr. Romualdo Bolk in the next week.

## 2018-02-13 LAB — CULTURE, BLOOD (ROUTINE X 2)
Culture: NO GROWTH
SPECIAL REQUESTS: ADEQUATE

## 2018-02-15 ENCOUNTER — Other Ambulatory Visit: Payer: Self-pay

## 2018-02-15 ENCOUNTER — Inpatient Hospital Stay (HOSPITAL_COMMUNITY)
Admission: EM | Admit: 2018-02-15 | Discharge: 2018-02-17 | DRG: 379 | Disposition: A | Discharge status: Hospice | Attending: Internal Medicine | Admitting: Internal Medicine

## 2018-02-15 ENCOUNTER — Emergency Department (HOSPITAL_COMMUNITY)

## 2018-02-15 ENCOUNTER — Encounter (HOSPITAL_COMMUNITY): Payer: Self-pay | Admitting: Pharmacy Technician

## 2018-02-15 DIAGNOSIS — Z886 Allergy status to analgesic agent status: Secondary | ICD-10-CM

## 2018-02-15 DIAGNOSIS — E1122 Type 2 diabetes mellitus with diabetic chronic kidney disease: Secondary | ICD-10-CM | POA: Diagnosis present

## 2018-02-15 DIAGNOSIS — Z66 Do not resuscitate: Secondary | ICD-10-CM | POA: Diagnosis present

## 2018-02-15 DIAGNOSIS — K920 Hematemesis: Principal | ICD-10-CM | POA: Diagnosis present

## 2018-02-15 DIAGNOSIS — Z515 Encounter for palliative care: Secondary | ICD-10-CM | POA: Diagnosis present

## 2018-02-15 DIAGNOSIS — Z885 Allergy status to narcotic agent status: Secondary | ICD-10-CM

## 2018-02-15 DIAGNOSIS — E1151 Type 2 diabetes mellitus with diabetic peripheral angiopathy without gangrene: Secondary | ICD-10-CM | POA: Diagnosis present

## 2018-02-15 DIAGNOSIS — N183 Chronic kidney disease, stage 3 unspecified: Secondary | ICD-10-CM | POA: Diagnosis present

## 2018-02-15 DIAGNOSIS — Z87891 Personal history of nicotine dependence: Secondary | ICD-10-CM

## 2018-02-15 DIAGNOSIS — I959 Hypotension, unspecified: Secondary | ICD-10-CM | POA: Diagnosis present

## 2018-02-15 DIAGNOSIS — F329 Major depressive disorder, single episode, unspecified: Secondary | ICD-10-CM | POA: Diagnosis present

## 2018-02-15 DIAGNOSIS — I10 Essential (primary) hypertension: Secondary | ICD-10-CM | POA: Diagnosis present

## 2018-02-15 DIAGNOSIS — Z8673 Personal history of transient ischemic attack (TIA), and cerebral infarction without residual deficits: Secondary | ICD-10-CM

## 2018-02-15 DIAGNOSIS — Z7951 Long term (current) use of inhaled steroids: Secondary | ICD-10-CM

## 2018-02-15 DIAGNOSIS — Z881 Allergy status to other antibiotic agents status: Secondary | ICD-10-CM

## 2018-02-15 DIAGNOSIS — Z89611 Acquired absence of right leg above knee: Secondary | ICD-10-CM

## 2018-02-15 DIAGNOSIS — Z888 Allergy status to other drugs, medicaments and biological substances status: Secondary | ICD-10-CM

## 2018-02-15 DIAGNOSIS — Z7982 Long term (current) use of aspirin: Secondary | ICD-10-CM

## 2018-02-15 DIAGNOSIS — I129 Hypertensive chronic kidney disease with stage 1 through stage 4 chronic kidney disease, or unspecified chronic kidney disease: Secondary | ICD-10-CM | POA: Diagnosis present

## 2018-02-15 DIAGNOSIS — Z882 Allergy status to sulfonamides status: Secondary | ICD-10-CM

## 2018-02-15 DIAGNOSIS — J449 Chronic obstructive pulmonary disease, unspecified: Secondary | ICD-10-CM | POA: Diagnosis present

## 2018-02-15 DIAGNOSIS — K922 Gastrointestinal hemorrhage, unspecified: Secondary | ICD-10-CM

## 2018-02-15 DIAGNOSIS — I69393 Ataxia following cerebral infarction: Secondary | ICD-10-CM

## 2018-02-15 DIAGNOSIS — Z79899 Other long term (current) drug therapy: Secondary | ICD-10-CM

## 2018-02-15 DIAGNOSIS — Z880 Allergy status to penicillin: Secondary | ICD-10-CM

## 2018-02-15 DIAGNOSIS — E785 Hyperlipidemia, unspecified: Secondary | ICD-10-CM | POA: Diagnosis present

## 2018-02-15 LAB — PREPARE RBC (CROSSMATCH)

## 2018-02-15 LAB — CBC
HCT: 36.6 % (ref 36.0–46.0)
HEMOGLOBIN: 11.4 g/dL — AB (ref 12.0–15.0)
MCH: 28.1 pg (ref 26.0–34.0)
MCHC: 31.1 g/dL (ref 30.0–36.0)
MCV: 90.4 fL (ref 78.0–100.0)
Platelets: 160 10*3/uL (ref 150–400)
RBC: 4.05 MIL/uL (ref 3.87–5.11)
RDW: 12.3 % (ref 11.5–15.5)
WBC: 13 10*3/uL — ABNORMAL HIGH (ref 4.0–10.5)

## 2018-02-15 LAB — I-STAT TROPONIN, ED: TROPONIN I, POC: 0 ng/mL (ref 0.00–0.08)

## 2018-02-15 MED ORDER — PANTOPRAZOLE SODIUM 40 MG IV SOLR
40.0000 mg | Freq: Two times a day (BID) | INTRAVENOUS | Status: DC
  Administered 2018-02-15 – 2018-02-17 (×4): 40 mg via INTRAVENOUS
  Filled 2018-02-15 (×4): qty 40

## 2018-02-15 MED ORDER — SODIUM CHLORIDE 0.9% IV SOLUTION: Freq: Once | INTRAVENOUS | Status: DC

## 2018-02-15 NOTE — ED Provider Notes (Signed)
[facility]   CSN: [id number] Arrival date & time: 02/15/18  2053     History   Chief Complaint No chief complaint on file.   HPI Carolyn Werner is a 84 y.o. female.  The history is provided by the patient.  Emesis   This is a new problem. The current episode started 1 to 2 hours ago. Episode frequency: once. The problem has been resolved. The emesis has an appearance of bright red blood. There has been no fever. Associated symptoms include abdominal pain, chills, cough, diarrhea and sweats. Pertinent negatives include no headaches.    Past Medical History:  Diagnosis Date  . Abdominal hernia   . Cataracts, both eyes   . CKD (chronic kidney disease), stage III ([name])   . Complication of anesthesia    CONFUSION   . DDD (degenerative disc disease), cervical   . Diabetes mellitus without complication ([name])   . Headache   . Hx of benign neoplasm of pituitary gland   . IBS (irritable colon syndrome)-C   . Mild persistent asthma   . PVD (peripheral vascular disease) ([name])   . Shingles   . Stroke [facility]([name])     Patient Active Problem List   Diagnosis Date Noted  . Hematemesis 02/16/2018  . Chronic right shoulder pain 11/29/2017  . Goals of care, counseling/discussion   . Palliative care encounter   . Acute upper GI bleed 04/09/2017  . Syncope, vasovagal 03/21/2017  . Carotid stenosis 03/21/2017  . S/P AKA (above knee amputation) unilateral, right ([name])   . Irritable bowel syndrome   . Diabetic peripheral neuropathy ([name])   . Embolic stroke ([name]) 01/30/2017  . Ataxia, post-stroke   . DM (diabetes mellitus), type 2 with peripheral vascular complications ([name])   . Stage 3 chronic kidney disease ([name])   . History of right above knee amputation ([name])   . History of stroke   . Benign essential HTN   . Transient cerebral ischemia   . Cerebral thrombosis with cerebral infarction 01/28/2017  . Right arm weakness  01/26/2017  . Weakness 01/26/2017  . Hyperlipemia 10/05/2016  . Weakness of both arms   . Left pontine stroke ([name]) 07/28/2016  . 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 Slurred speech 07/27/2016  . CKD (chronic kidney disease) stage 3, GFR 30-59 ml/min ([name]) 07/27/2016  . Essential hypertension 07/27/2016  . Depression 07/27/2016  . Glaucoma 07/27/2016  . Neuropathic pain 07/27/2016  . Asthma 07/27/2016  . Heart palpitations 07/05/2016  . Cardiac murmur 07/05/2016  . Phantom limb pain ([name]) 11/13/2014  . Cervical disc disorder with radiculopathy of cervical region 11/13/2014    Past Surgical History:  Procedure Laterality Date  . ABOVE KNEE LEG AMPUTATION Right 2013  . APPENDECTOMY    . BYPASS GRAFT     ileofemoral   . PITUITARY EXCISION    . TONSILLECTOMY       OB History   None      Home Medications    Prior to Admission medications   Medication Sig Start Date End Date Taking? Authorizing Provider  acetaminophen (TYLENOL) 500 MG tablet Take 1 tablet (500 mg total) by mouth every 6 (six) hours as needed (pain). 02/07/17   Love, Evlyn KannerPamela S, PA-C  aspirin EC 81 MG EC tablet Take 1 tablet (81 mg total) by mouth daily. 08/01/16   Valentino NoseBoswell, Nathan, MD  Biotin 1000 MCG tablet Take 1,000 mcg by mouth  daily.    [provider]  budesonide (PULMICORT) 0.25 MG/2ML nebulizer solution Take 0.25 mg by nebulization 2 (two) times daily.     [provider]  cholecalciferol (VITAMIN D) 1000 units tablet Take 1,000 Units by mouth daily.    [provider]  DULoxetine (CYMBALTA) 30 MG capsule Take 30 mg by mouth daily.    [provider]  Flaxseed, Linseed, (FLAXSEED OIL) 1000 MG CAPS Take 1,000 mg by mouth 3 (three) times daily.     [provider]  gabapentin (NEURONTIN) 100 MG capsule Take 100-200 mg by mouth See admin instructions. Take 1 capsule in the morning and take 2 capsules at night    [provider]  HYDROCODONE-ACETAMINOPHEN PO Take 1 tablet by mouth every 4 (four) hours as needed (for pain).     [provider]  latanoprost (XALATAN) 0.005 % ophthalmic solution Place 1 drop into both eyes at bedtime.    [provider]  levalbuterol Pauline Aus HFA) 45 MCG/ACT inhaler Inhale 2 puffs into the lungs every 4 (four) hours as needed for wheezing.    [provider]  loratadine (CLARITIN) 10 MG tablet Take 10 mg by mouth daily.     [provider]  Multiple Vitamin (MULTIVITAMIN WITH MINERALS) TABS tablet Take 1 tablet by mouth daily.    [provider]  Polyethyl Glycol-Propyl Glycol (SYSTANE ULTRA) 0.4-0.3 % SOLN Apply 1 drop to eye 2 (two) times daily. Both eyes    [provider]  senna-docusate (SENOKOT-S) 8.6-50 MG tablet Take 2 tablets by mouth daily as needed for mild constipation.     [provider]  sorbitol 70 % solution Take 30 mLs by mouth daily as needed (If no BM x3 days).     [provider]  traMADol (ULTRAM) 50 MG tablet Take 50 mg by mouth 2 (two) times daily.    [provider]    Family History Family History  Problem Relation Age of Onset  . Dementia Unknown   . Hypertension Daughter     Social History Social History   Tobacco Use  . Smoking status: Former Smoker    Packs/day: 0.25    Years: 23.00    Pack years: 5.75    Last attempt to quit: 06/05/1973    Years since quitting: 44.7  . Smokeless tobacco: Never Used  Substance Use Topics  . Alcohol use: No    Alcohol/week: 0.0 standard drinks  . Drug use: No     Allergies   Atacand hct [candesartan cilexetil-hctz]; Carbamazepine; Erythromycin; Glucophage [metformin hcl]; Lactose intolerance (gi); Lipitor [atorvastatin]; Nsaids; Penicillins; Sulfa antibiotics; Ceclor [cefaclor]; Clindamycin/lincomycin; Milk-related compounds; Rosuvastatin calcium; Dilaudid [hydromorphone hcl]; and Plavix [clopidogrel]   Review of  Systems Review of Systems  Constitutional: Positive for chills.  HENT: Negative for congestion and sore throat.   Respiratory: Positive for cough and shortness of breath.   Cardiovascular: Negative for chest pain.  Gastrointestinal: Positive for abdominal pain, diarrhea and vomiting.  Genitourinary: Negative for dysuria.  Musculoskeletal: Negative for back pain.  Skin: Positive for wound (great toe).  Neurological: Negative for headaches.     Physical Exam Updated Vital Signs BP 125/84   Pulse (!) 114   Temp 98 F (36.7 C)   Resp 20   Ht 5\' 4"  (1.626 m)   Wt 55 kg   SpO2 99%   BMI 20.81 kg/m   Physical Exam  Constitutional: She appears well-developed and well-nourished. No distress.  HENT:  Head: Normocephalic and atraumatic.  Eyes: Conjunctivae are normal.  Neck: Neck supple.  Cardiovascular: Normal rate and regular rhythm.  No murmur heard. Pulmonary/Chest: Effort normal and breath sounds normal. No respiratory distress.  Abdominal: Soft. There is tenderness (epigastric).  Musculoskeletal: She exhibits no edema.  Neurological: She is alert.  Skin: Skin is warm and dry.  Psychiatric: She has a normal mood and affect.  Nursing note and vitals reviewed.    ED Treatments / Results  Labs (all labs ordered are listed, but only abnormal results are displayed) Labs Reviewed  CBC - Abnormal; Notable for the following components:      Result Value   WBC 13.0 (*)    Hemoglobin 11.4 (*)    All other components within normal limits  COMPREHENSIVE METABOLIC PANEL - Abnormal; Notable for the following components:   Glucose, Bld 208 (*)    BUN 30 (*)    Creatinine, Ser 1.48 (*)    Total Protein 5.7 (*)    Albumin 3.0 (*)    GFR calc non Af Amer 29 (*)    GFR calc Af Amer 34 (*)    All other components within normal limits  LIPASE, BLOOD  I-STAT TROPONIN, ED  TYPE AND SCREEN  PREPARE RBC (CROSSMATCH)    EKG None  Radiology Dg Chest Portable 1 View  Result  Date: 02/15/2018 CLINICAL DATA:  Shortness of breath EXAM: PORTABLE CHEST 1 VIEW COMPARISON:  09/12/2017 FINDINGS: Elevation of left diaphragm with subsegmental atelectasis. No pleural effusion. Normal heart size with aortic atherosclerosis. No pneumothorax. IMPRESSION: No active disease. Elevated left diaphragm with subsegmental atelectasis Electronically Signed   By: Jasmine Pang M.D.   On: 02/15/2018 21:38    Procedures Procedures (including critical care time)  Medications Ordered in ED Medications  pantoprazole (PROTONIX) injection 40 mg (40 mg Intravenous Given 02/15/18 2248)  0.9 %  sodium chloride infusion (Manually program via Guardrails IV Fluids) (has no administration in time range)  0.9 %  sodium chloride infusion (Manually program via Guardrails IV Fluids) (has no administration in time range)     Initial Impression / Assessment and Plan / ED Course  I have reviewed the triage vital signs and the nursing notes.  Pertinent labs & imaging results that were available during my care of the patient were reviewed by me and considered in my medical decision making (see chart for details).     The patient is a 84 year old female with past medical history of CVAs and upper GI bleed who presents with hematemesis.  The patient reports that she had an upper GI bleed 1 year ago when she was on Brilinta, but she has not been taking Brilinta.  She reports that her only new medication is doxycycline.  Concern for repeat upper GI bleed, unclear etiology as patient was never able to get an upper GI.  The patient is DNR/DNI, but requests medications and blood transfusions if needed.  The patient usually stays with her daughter, however she was in hospice facility for respite care.  Patient had diarrhea this morning, and then she had a episode of bloody vomiting this evening.  The patient feels diaphoretic, chills, and has paresis.  The patient had several episodes of hematemesis in the emergency  department.  Patient became hypotensive and was giving a unit of emergency release blood.  The patient's type and screen sent and CBC reveals mild anemia compared to previous values.  Patient CMP unremarkable except for AKI.  Admitted to medicine  as her daughter, her primary caretaker, is out of town.  Discussed the patient's care with the daughter over the phone and the patient and they both request admission for blood product transfusion and management of patient's pain and nausea.  Patient care supervised by Dr. Clayborne Dana.  Nash Dimmer, MD   Final Clinical Impressions(s) / ED Diagnoses   Final diagnoses:  None    ED Discharge Orders    None       Nash Dimmer, MD 02/16/18 0128    Marily Memos, MD 02/17/18 1610

## 2018-02-15 NOTE — ED Notes (Signed)
Emergency release blood unit [id number] initiated at 54500ml/hour at 2314.

## 2018-02-15 NOTE — ED Notes (Signed)
EDP made aware of pt BP 

## 2018-02-15 NOTE — ED Notes (Signed)
1 bout of bloody emesis at this time

## 2018-02-16 ENCOUNTER — Encounter (HOSPITAL_COMMUNITY): Payer: Self-pay | Admitting: *Deleted

## 2018-02-16 DIAGNOSIS — K920 Hematemesis: Secondary | ICD-10-CM | POA: Diagnosis present

## 2018-02-16 DIAGNOSIS — E1151 Type 2 diabetes mellitus with diabetic peripheral angiopathy without gangrene: Secondary | ICD-10-CM | POA: Diagnosis not present

## 2018-02-16 DIAGNOSIS — N183 Chronic kidney disease, stage 3 (moderate): Secondary | ICD-10-CM | POA: Diagnosis not present

## 2018-02-16 DIAGNOSIS — K922 Gastrointestinal hemorrhage, unspecified: Secondary | ICD-10-CM | POA: Diagnosis not present

## 2018-02-16 DIAGNOSIS — I10 Essential (primary) hypertension: Secondary | ICD-10-CM

## 2018-02-16 LAB — COMPREHENSIVE METABOLIC PANEL
ALT: 13 U/L (ref 0–44)
ALT: 11 U/L (ref 0–44)
AST: 22 U/L (ref 15–41)
AST: 18 U/L (ref 15–41)
Albumin: 3 g/dL — ABNORMAL LOW (ref 3.5–5.0)
Albumin: 2.5 g/dL — ABNORMAL LOW (ref 3.5–5.0)
Alkaline Phosphatase: 78 U/L (ref 38–126)
Alkaline Phosphatase: 66 U/L (ref 38–126)
Anion gap: 10 (ref 5–15)
Anion gap: 6 (ref 5–15)
BILIRUBIN TOTAL: 1 mg/dL (ref 0.3–1.2)
BUN: 30 mg/dL — AB (ref 8–23)
BUN: 37 mg/dL — AB (ref 8–23)
CALCIUM: 9.6 mg/dL (ref 8.9–10.3)
CHLORIDE: 113 mmol/L — AB (ref 98–111)
CO2: 23 mmol/L (ref 22–32)
CO2: 21 mmol/L — ABNORMAL LOW (ref 22–32)
CREATININE: 1.18 mg/dL — AB (ref 0.44–1.00)
Calcium: 8.8 mg/dL — ABNORMAL LOW (ref 8.9–10.3)
Chloride: 104 mmol/L (ref 98–111)
Creatinine, Ser: 1.48 mg/dL — ABNORMAL HIGH (ref 0.44–1.00)
GFR calc Af Amer: 34 mL/min — ABNORMAL LOW (ref >60)
GFR calc Af Amer: 44 mL/min — ABNORMAL LOW (ref >60)
GFR calc non Af Amer: 29 mL/min — ABNORMAL LOW (ref >60)
GFR, EST NON AFRICAN AMERICAN: 38 mL/min — AB (ref >60)
Glucose, Bld: 208 mg/dL — ABNORMAL HIGH (ref 70–99)
Glucose, Bld: 141 mg/dL — ABNORMAL HIGH (ref 70–99)
POTASSIUM: 4.8 mmol/L (ref 3.5–5.1)
Potassium: 4.6 mmol/L (ref 3.5–5.1)
Sodium: 137 mmol/L (ref 135–145)
Sodium: 140 mmol/L (ref 135–145)
Total Bilirubin: 1.1 mg/dL (ref 0.3–1.2)
Total Protein: 5.7 g/dL — ABNORMAL LOW (ref 6.5–8.1)
Total Protein: 5 g/dL — ABNORMAL LOW (ref 6.5–8.1)

## 2018-02-16 LAB — CBC
HCT: 39.9 % (ref 36.0–46.0)
Hemoglobin: 13.4 g/dL (ref 12.0–15.0)
MCH: 29.4 pg (ref 26.0–34.0)
MCHC: 33.6 g/dL (ref 30.0–36.0)
MCV: 87.5 fL (ref 78.0–100.0)
Platelets: 101 10*3/uL — ABNORMAL LOW (ref 150–400)
RBC: 4.56 MIL/uL (ref 3.87–5.11)
RDW: 13.2 % (ref 11.5–15.5)
WBC: 13.1 10*3/uL — ABNORMAL HIGH (ref 4.0–10.5)

## 2018-02-16 LAB — LIPASE, BLOOD: Lipase: 21 U/L (ref 11–51)

## 2018-02-16 MED ORDER — GABAPENTIN 100 MG PO CAPS
200.0000 mg | ORAL_CAPSULE | Freq: Every day | ORAL | Status: DC
  Administered 2018-02-16: 200 mg via ORAL
  Filled 2018-02-16: qty 2

## 2018-02-16 MED ORDER — INSULIN ASPART 100 UNIT/ML ~~LOC~~ SOLN: 0.0000 [IU] | Freq: Three times a day (TID) | SUBCUTANEOUS | Status: DC

## 2018-02-16 MED ORDER — LEVALBUTEROL HCL 0.63 MG/3ML IN NEBU: 0.6300 mg | INHALATION_SOLUTION | Freq: Two times a day (BID) | RESPIRATORY_TRACT | Status: DC

## 2018-02-16 MED ORDER — ONDANSETRON HCL 4 MG/2ML IJ SOLN: 4.0000 mg | Freq: Four times a day (QID) | INTRAMUSCULAR | Status: DC | PRN

## 2018-02-16 MED ORDER — GABAPENTIN 100 MG PO CAPS
100.0000 mg | ORAL_CAPSULE | Freq: Every morning | ORAL | Status: DC
  Administered 2018-02-17: 100 mg via ORAL
  Filled 2018-02-16 (×2): qty 1

## 2018-02-16 MED ORDER — ONDANSETRON HCL 4 MG PO TABS: 4.0000 mg | ORAL_TABLET | Freq: Four times a day (QID) | ORAL | Status: DC | PRN

## 2018-02-16 MED ORDER — TRAMADOL HCL 50 MG PO TABS
50.0000 mg | ORAL_TABLET | Freq: Two times a day (BID) | ORAL | Status: DC | PRN
  Administered 2018-02-16 – 2018-02-17 (×2): 50 mg via ORAL
  Filled 2018-02-16 (×2): qty 1

## 2018-02-16 MED ORDER — INSULIN ASPART 100 UNIT/ML ~~LOC~~ SOLN: 0.0000 [IU] | Freq: Every day | SUBCUTANEOUS | Status: DC

## 2018-02-16 MED ORDER — SUCRALFATE 1 G PO TABS
1.0000 g | ORAL_TABLET | Freq: Three times a day (TID) | ORAL | Status: DC
  Administered 2018-02-16 – 2018-02-17 (×4): 1 g via ORAL
  Filled 2018-02-16 (×4): qty 1

## 2018-02-16 MED ORDER — DEXTROSE-NACL 5-0.9 % IV SOLN
INTRAVENOUS | Status: DC
  Administered 2018-02-16 (×2): via INTRAVENOUS

## 2018-02-16 MED ORDER — DULOXETINE HCL 30 MG PO CPEP
30.0000 mg | ORAL_CAPSULE | Freq: Every day | ORAL | Status: DC
  Administered 2018-02-17: 30 mg via ORAL
  Filled 2018-02-16 (×2): qty 1

## 2018-02-16 MED ORDER — LEVALBUTEROL HCL 0.63 MG/3ML IN NEBU
0.6300 mg | INHALATION_SOLUTION | Freq: Four times a day (QID) | RESPIRATORY_TRACT | Status: DC | PRN
  Administered 2018-02-17: 0.63 mg via RESPIRATORY_TRACT
  Filled 2018-02-16: qty 3

## 2018-02-16 NOTE — H&P (Signed)
History and Physical   [place] ZOX:[id number] DOB: 12-25-1921 DOA: 02/15/2018  Referring MD/NP/PA: Dr Clayborne Dana  PCP: Lynn Ito, MD   Patient coming from: [facility] and respite care  Chief Complaint: Hematemesis  HPI: Carolyn Werner is a 84 y.o. female with medical history significant of chronic kidney disease stage III, hypertension, diabetes, status post right AKA, hyperlipidemia, history of CVA who is on hospice care and DNR with previous GI bleed when she was on Brilinta.  Patient was brought in from his facility today after copious hematemesis.  She suddenly had nausea with vomiting and then it was frank blood.  She was hypotensive on arrival in the ER with blood pressure systolic in the 70s.  Initial hemoglobin is more than 11 g.  Patient is DNR does not want invasive procedure but patient and daughter who is power of attorney requests stabilization in the hospital with transfusion, management of her nausea vomiting as well as IV fluids.  At this point patient is stabilized with IV fluids.  No further bleeding noted no further vomiting.  ED Course: Temperature is 98 blood pressure 74/53 initially currently 112/83 pulse 109 respiratory rate of 25 oxygen sat 97% room air.  White count is 13,000 hemoglobin 11.4 platelets 160.  Sodium 137 BUN 30 creatinine 1.48 and glucose 208.  Patient was evaluated by the ER.  Based on patient and her daughter's wishes she is being transfused at the moment.  She has had IV fluid resuscitation.  We are being consulted for admission to stabilize the patient prior to discharge back.  Review of Systems: As per HPI otherwise 10 point review of systems negative.    Past Medical History:  Diagnosis Date  . Abdominal hernia   . Cataracts, both eyes   . CKD (chronic kidney disease), stage III ([name])   . Complication of anesthesia    CONFUSION   . DDD (degenerative disc disease), cervical   . Diabetes mellitus without complication ([name])   .  Headache   . Hx of benign neoplasm of pituitary gland   . IBS (irritable colon syndrome)-C   . Mild persistent asthma   . PVD (peripheral vascular disease) ([name])   . Shingles   . Stroke [facility])     Past Surgical History:  Procedure Laterality Date  . ABOVE KNEE LEG AMPUTATION Right 2013  . APPENDECTOMY    . BYPASS GRAFT     ileofemoral   . PITUITARY EXCISION    . TONSILLECTOMY       reports that she quit smoking about 44 years ago. She has a 5.75 pack-year smoking history. She has never used smokeless tobacco. She reports that she does not drink alcohol or use drugs.  Allergies  Allergen Reactions  . Atacand Hct [Candesartan Cilexetil-Hctz] Other (See Comments)    Kidney disease  . Carbamazepine Other (See Comments)    Changes in ability to read, think clearly, unsteady gait  . Erythromycin Diarrhea  . Glucophage [Metformin Hcl] Other (See Comments)    Chronic low blood sugar   . Lactose Intolerance (Gi) Diarrhea and Nausea And Vomiting    Nausea & Vomiting; Diarrhea  . Lipitor [Atorvastatin] Other (See Comments)    Muscle weakness  . Nsaids Nausea And Vomiting    Motrin, Naprosyn, Voltaren, Celebrex, Prevacid, Mobic -- abdominal pain and burning, cramps, loose stool  . Penicillins Shortness Of Breath and Rash    Has patient had a PCN reaction causing immediate rash, facial/tongue/throat swelling, SOB or lightheadedness with  hypotension: Yes Has patient had a PCN reaction causing severe rash involving mucus membranes or skin necrosis: No Has patient had a PCN reaction that required hospitalization: No Has patient had a PCN reaction occurring within the last 10 years: No If all of the above answers are "NO", then may proceed with Cephalosporin use.  . Sulfa Antibiotics Anaphylaxis  . Ceclor [Cefaclor] Hives  . Clindamycin/Lincomycin Other (See Comments)    Abdominal pain, loss of appetite  . Milk-Related Compounds Other (See Comments)    Bloating, abdominal discomfort.    . Rosuvastatin Calcium Other (See Comments)    Lethargy, malaise  . Dilaudid [Hydromorphone Hcl] Rash  . Plavix [Clopidogrel] Rash and Other (See Comments)    Skin peeled, hair fell out    Family History  Problem Relation Age of Onset  . Dementia Unknown   . Hypertension Daughter      Prior to Admission medications   Medication Sig Start Date End Date Taking? Authorizing Provider  acetaminophen (TYLENOL) 500 MG tablet Take 1 tablet (500 mg total) by mouth every 6 (six) hours as needed (pain). 02/07/17   Love, Evlyn Kanner, PA-C  aspirin EC 81 MG EC tablet Take 1 tablet (81 mg total) by mouth daily. 08/01/16   Valentino Nose, MD  Biotin 1000 MCG tablet Take 1,000 mcg by mouth daily.    [provider]  budesonide (PULMICORT) 0.25 MG/2ML nebulizer solution Take 0.25 mg by nebulization 2 (two) times daily.     [provider]  cholecalciferol (VITAMIN D) 1000 units tablet Take 1,000 Units by mouth daily.    [provider]  DULoxetine (CYMBALTA) 30 MG capsule Take 30 mg by mouth daily.    [provider]  Flaxseed, Linseed, (FLAXSEED OIL) 1000 MG CAPS Take 1,000 mg by mouth 3 (three) times daily.     [provider]  gabapentin (NEURONTIN) 100 MG capsule Take 100-200 mg by mouth See admin instructions. Take 1 capsule in the morning and take 2 capsules at night    [provider]  HYDROCODONE-ACETAMINOPHEN PO Take 1 tablet by mouth every 4 (four) hours as needed (for pain).     [provider]  latanoprost (XALATAN) 0.005 % ophthalmic solution Place 1 drop into both eyes at bedtime.    [provider]  levalbuterol Pauline Aus HFA) 45 MCG/ACT inhaler Inhale 2 puffs into the lungs every 4 (four) hours as needed for wheezing.    [provider]  loratadine (CLARITIN) 10 MG tablet Take 10 mg by mouth daily.     [provider]  Multiple Vitamin (MULTIVITAMIN WITH MINERALS) TABS tablet Take 1 tablet by mouth daily.     [provider]  Polyethyl Glycol-Propyl Glycol (SYSTANE ULTRA) 0.4-0.3 % SOLN Apply 1 drop to eye 2 (two) times daily. Both eyes    [provider]  senna-docusate (SENOKOT-S) 8.6-50 MG tablet Take 2 tablets by mouth daily as needed for mild constipation.     [provider]  sorbitol 70 % solution Take 30 mLs by mouth daily as needed (If no BM x3 days).     [provider]  traMADol (ULTRAM) 50 MG tablet Take 50 mg by mouth 2 (two) times daily.    [provider]    Physical Exam: Vitals:   02/16/18 0030 02/16/18 0045 02/16/18 0100 02/16/18 0108  BP: 118/76 113/80 112/83   Pulse: (!) 107 (!) 107    Resp: 19 17 (!) 22   Temp:  98 F (36.7 C)  TempSrc:      SpO2: 98% 97%    Weight:      Height:          Constitutional: NAD, calm, comfortable Vitals:   02/16/18 0030 02/16/18 0045 02/16/18 0100 02/16/18 0108  BP: 118/76 113/80 112/83   Pulse: (!) 107 (!) 107    Resp: 19 17 (!) 22   Temp:    98 F (36.7 C)  TempSrc:      SpO2: 98% 97%    Weight:      Height:        Patient is frail, awake alert and communicating, chronically sick Eyes: PERRL, lids and conjunctivae normal ENMT: Mucous membranes are moist. Posterior pharynx clear of any exudate or lesions.Normal dentition.  Neck: normal, supple, no masses, no thyromegaly Respiratory: clear to auscultation bilaterally, no wheezing, no crackles. Normal respiratory effort. No accessory muscle use.  Cardiovascular: Regular rate and rhythm, no murmurs / rubs / gallops. No extremity edema. 2+ pedal pulses. No carotid bruits.  Abdomen: no tenderness, no masses palpated. No hepatosplenomegaly. Bowel sounds positive.  Musculoskeletal: no clubbing / cyanosis. No joint deformity upper and lower, status post right AKA. Good ROM, no contractures. Normal muscle tone.  Skin: no rashes, lesions, ulcers. No induration Neurologic: CN 2-12 grossly intact. Sensation intact, DTR normal. Strength  5/5 in all 4.  Psychiatric: Normal judgment and insight. Alert and oriented x 3. Normal mood.     Labs on Admission: I have personally reviewed following labs and imaging studies  CBC: Recent Labs  Lab 02/15/18 2122  WBC 13.0*  HGB 11.4*  HCT 36.6  MCV 90.4  PLT 160   Basic Metabolic Panel: Recent Labs  Lab 02/15/18 2122  NA 137  K 4.8  CL 104  CO2 23  GLUCOSE 208*  BUN 30*  CREATININE 1.48*  CALCIUM 9.6   GFR: Estimated Creatinine Clearance: 20.1 mL/min (A) (by C-G formula based on SCr of 1.48 mg/dL (H)). Liver Function Tests: Recent Labs  Lab 02/15/18 2122  AST 22  ALT 13  ALKPHOS 78  BILITOT 1.0  PROT 5.7*  ALBUMIN 3.0*   Recent Labs  Lab 02/15/18 2122  LIPASE 21   No results for input(s): AMMONIA in the last 168 hours. Coagulation Profile: No results for input(s): INR, PROTIME in the last 168 hours. Cardiac Enzymes: No results for input(s): CKTOTAL, CKMB, CKMBINDEX, TROPONINI in the last 168 hours. BNP (last 3 results) No results for input(s): PROBNP in the last 8760 hours. HbA1C: No results for input(s): HGBA1C in the last 72 hours. CBG: No results for input(s): GLUCAP in the last 168 hours. Lipid Profile: No results for input(s): CHOL, HDL, LDLCALC, TRIG, CHOLHDL, LDLDIRECT in the last 72 hours. Thyroid Function Tests: No results for input(s): TSH, T4TOTAL, FREET4, T3FREE, THYROIDAB in the last 72 hours. Anemia Panel: No results for input(s): VITAMINB12, FOLATE, FERRITIN, TIBC, IRON, RETICCTPCT in the last 72 hours. Urine analysis:    Component Value Date/Time   COLORURINE YELLOW 02/08/2018 2000   APPEARANCEUR CLEAR 02/08/2018 2000   LABSPEC 1.013 02/08/2018 2000   PHURINE 6.0 02/08/2018 2000   GLUCOSEU NEGATIVE 02/08/2018 2000   HGBUR NEGATIVE 02/08/2018 2000   BILIRUBINUR NEGATIVE 02/08/2018 2000   KETONESUR NEGATIVE 02/08/2018 2000   PROTEINUR NEGATIVE 02/08/2018 2000   NITRITE NEGATIVE 02/08/2018 2000   LEUKOCYTESUR SMALL (A)  02/08/2018 2000   Sepsis Labs: @LABRCNTIP (procalcitonin:4,lacticidven:4) ) Recent Results (from the past 240 hour(s))  Blood culture (  routine x 2)     Status: None   Collection Time: 02/08/18  5:25 PM  Result Value Ref Range Status   Specimen Description BLOOD LEFT ANTECUBITAL  Final   Special Requests   Final    BOTTLES DRAWN AEROBIC AND ANAEROBIC Blood Culture adequate volume   Culture   Final    NO GROWTH 5 DAYS Performed at [facility], [address] [place], [place] [address]    Report Status 02/13/2018 FINAL  Final     Radiological Exams on Admission: Dg Chest Portable 1 View  Result Date: 02/15/2018 CLINICAL DATA:  Shortness of breath EXAM: PORTABLE CHEST 1 VIEW COMPARISON:  09/12/2017 FINDINGS: Elevation of left diaphragm with subsegmental atelectasis. No pleural effusion. Normal heart size with aortic atherosclerosis. No pneumothorax. IMPRESSION: No active disease. Elevated left diaphragm with subsegmental atelectasis Electronically Signed   By: Jasmine Pang M.D.   On: 02/15/2018 21:38    Assessment/Plan Principal Problem:   Hematemesis Active Problems:   DM (diabetes mellitus), type 2 with peripheral vascular complications ([name])   Stage 3 chronic kidney disease ([name])   Benign essential HTN     #1 hematemesis: Cause is not clear.  Patient was taking aspirin but she has been on that for long time.  This could be peptic ulcer, could also be gastritis, patient has potential for having AVMs.  At this point we will hold aspirin and other medications related.  We will treat patient symptomatically.  IV Protonix as well as cereals H&H.  Transfusion is ongoing but will follow H&H first.  IV fluids.  No need for GI consult as family and patient do not require intervention.  #2 diabetes: Continue with home regimen.  Sliding scale insulin mainly.  Patient will be n.p.o. due to GI bleed.  #3 hypertension: Patient is actually hypotensive.  Will hold oral medications.   Once blood pressure stabilizes and blood pressure increases we will consider starting blood pressure medications.  #4 chronic kidney disease stage III: At baseline.  She is mentally palliative care at the moment.  #5 history of CVA: patient fully awake communicating with no obvious problem.   DVT prophylaxis: SCD  Code Status: DNR  Family Communication: Daughter who is POA  Disposition Plan: To be determined  Consults called: None  Admission status: inpatient   Severity of Illness: The appropriate patient status for this patient is INPATIENT. Inpatient status is judged to be reasonable and necessary in order to provide the required intensity of service to ensure the patient's safety. The patient's presenting symptoms, physical exam findings, and initial radiographic and laboratory data in the context of their chronic comorbidities is felt to place them at high risk for further clinical deterioration. Furthermore, it is not anticipated that the patient will be medically stable for discharge from the hospital within 2 midnights of admission. The following factors support the patient status of inpatient.   " The patient's presenting symptoms include hematemesis. " The worrisome physical exam findings include no significant physical finding but chronically ill looking. " The initial radiographic and laboratory data are worrisome because of hemoglobin is 11.4. " The chronic co-morbidities include diabetes with history of GI bleed.   * I certify that at the point of admission it is my clinical judgment that the patient will require inpatient hospital care spanning beyond 2 midnights from the point of admission due to high intensity of service, high risk for further deterioration and high frequency of surveillance required.*  Lonia BloodGARBA,LAWAL MD [facility] Pager [phone]  If 7PM-7AM, please contact night-coverage [email or web address] Password [email or web address]  02/16/2018, 1:17 AM

## 2018-02-16 NOTE — Progress Notes (Addendum)
PROGRESS NOTE    [place]  KGM:[id number] DOB: Nov 05, 1921 DOA: 02/15/2018 PCP: Lynn Ito, MD    Brief Narrative:  84 year old female who presented with hematemesis.  She does have significant past medical history for chronic kidney stage III, hypertension, type 2 diabetes mellitus, dyslipidemia, history of CVA and right above-the-knee amputation.  Currently under hospice services.  She was found to have a copious amounts of hematemesis, associated with nausea and vomiting.  On her initial physical examination her systolic blood pressure was down to 74/53 mmHg, temperature 98, respiratory rate 25, heart rate 109, oxygen saturation 97%.  Ill looking appearing, moist mucous membranes, lungs clear to auscultation bilaterally, heart S1-S2 present and rhythmic, the abdomen was soft and nontender, no lower extremity edema, positive right above-knee amputation.  Sodium 140, potassium 4.6, chloride 113, bicarb 21, glucose 141, BUN 37, creatinine 1.18, white count 13.1, hemoglobin 13.4, hematocrit 39.9, platelets 101.  Chest radiograph with left rotation, left hemidiaphragm elevation, no infiltrates.  EKG with normal sinus rhythm, normal axis normal intervals.  Patient was admitted to the hospital with the working diagnosis of acute severe hematemesis, complicated by hypotension.  Assessment & Plan:   Principal Problem:   Hematemesis Active Problems:   DM (diabetes mellitus), type 2 with peripheral vascular complications ([name])   Stage 3 chronic kidney disease ([name])   Benign essential HTN   1. Hematemesis complicated with hypotension. No further bleeding, patient continue to have abdominal discomfort. Prefers to be conservative in terms of invasive procedures. Continue to be at high risk for recurrent bleeding. Continue to monitor cell count. For now treatment with antiacids and as needed antiemetics. Will advance diet as tolerated. Will continue hydration with dextrose and saline at 75 ml per  hour. Hb today at 13,4 with Hct at 39.9.   2. T2DM. Continue glucose cover and monitoring with insulin sliding scale.   3. CKD stage 3. Stable renal function and electrolytes, will follow on renal panel in am.   4. History of CVA. Significant deconditioning, follow with physical therapy evaluation. No anticoagulants due to gastrointestinal bleeding.   5. Depression. Continue duloxetine per home regimen/    DVT prophylaxis: scd   Code Status:  dnr  Family Communication: I spoke with patient's care giver at the bedside and all questions were addressed.  Disposition Plan/ discharge barriers: hospice when clinically improved and no further bleeding   Consultants:     Procedures:     Antimicrobials:       Subjective: Patient feeling better but not back to her baseline, positive abdominal discomfort, no further nausea or vomiting. No chest pain or dyspnea.   Objective: Vitals:   02/16/18 0108 02/16/18 0115 02/16/18 0205 02/16/18 0258  BP:  125/84 122/75 (!) 129/111  Pulse:  (!) 114 (!) 113 (!) 110  Resp:  20 18 18   Temp: 98 F (36.7 C)  98 F (36.7 C) 98.4 F (36.9 C)  TempSrc:   Oral Oral  SpO2:  99% 98% 100%  Weight:      Height:        Intake/Output Summary (Last 24 hours) at 02/16/2018 1031 Last data filed at 02/16/2018 0001 Gross per 24 hour  Intake 375 ml  Output -  Net 375 ml   Filed Weights   02/15/18 2101  Weight: 55 kg    Examination:   General: Deconditioned and ill looking appearing   Neurology: Awake and alert, non focal  E ENT: mild pallor, no icterus, oral mucosa moist  Cardiovascular: No JVD. S1-S2 present, rhythmic, no gallops, rubs, or murmurs. No lower extremity edema. Pulmonary: positive breath sounds bilaterally, adequate air movement, no wheezing, rhonchi or rales. Gastrointestinal. Abdomen distended with no organomegaly, non tender, no rebound or guarding Skin. Left foot toe lesions.  Musculoskeletal: right AKA     Data  Reviewed: I have personally reviewed following labs and imaging studies  CBC: Recent Labs  Lab 02/15/18 2122 02/16/18 0754  WBC 13.0* 13.1*  HGB 11.4* 13.4  HCT 36.6 39.9  MCV 90.4 87.5  PLT 160 101*   Basic Metabolic Panel: Recent Labs  Lab 02/15/18 2122 02/16/18 0754  NA 137 140  K 4.8 4.6  CL 104 113*  CO2 23 21*  GLUCOSE 208* 141*  BUN 30* 37*  CREATININE 1.48* 1.18*  CALCIUM 9.6 8.8*   GFR: Estimated Creatinine Clearance: 25.2 mL/min (A) (by C-G formula based on SCr of 1.18 mg/dL (H)). Liver Function Tests: Recent Labs  Lab 02/15/18 2122 02/16/18 0754  AST 22 18  ALT 13 11  ALKPHOS 78 66  BILITOT 1.0 1.1  PROT 5.7* 5.0*  ALBUMIN 3.0* 2.5*   Recent Labs  Lab 02/15/18 2122  LIPASE 21   No results for input(s): AMMONIA in the last 168 hours. Coagulation Profile: No results for input(s): INR, PROTIME in the last 168 hours. Cardiac Enzymes: No results for input(s): CKTOTAL, CKMB, CKMBINDEX, TROPONINI in the last 168 hours. BNP (last 3 results) No results for input(s): PROBNP in the last 8760 hours. HbA1C: No results for input(s): HGBA1C in the last 72 hours. CBG: No results for input(s): GLUCAP in the last 168 hours. Lipid Profile: No results for input(s): CHOL, HDL, LDLCALC, TRIG, CHOLHDL, LDLDIRECT in the last 72 hours. Thyroid Function Tests: No results for input(s): TSH, T4TOTAL, FREET4, T3FREE, THYROIDAB in the last 72 hours. Anemia Panel: No results for input(s): VITAMINB12, FOLATE, FERRITIN, TIBC, IRON, RETICCTPCT in the last 72 hours.    Radiology Studies: I have reviewed all of the imaging during this hospital visit personally     Scheduled Meds: . sodium chloride   Intravenous Once  . sodium chloride   Intravenous Once  . insulin aspart  0-5 Units Subcutaneous QHS  . insulin aspart  0-9 Units Subcutaneous TID WC  . pantoprazole (PROTONIX) IV  40 mg Intravenous Q12H   Continuous Infusions: . dextrose 5 % and 0.9% NaCl 125 mL/hr  at 02/16/18 0305     LOS: 0 days        Mauricio Annett Gulaaniel Arrien, MD [facility] Pager [phone]

## 2018-02-16 NOTE — ED Notes (Signed)
1st blood unit of o-neg blood infused.  2nd unit of blood typed and crossmatched hung

## 2018-02-16 NOTE — ED Notes (Signed)
Report called to rn on 5n 

## 2018-02-16 NOTE — ED Notes (Signed)
No reaction form first 120 ml  Blood increased to 150 ml/min

## 2018-02-16 NOTE — Progress Notes (Addendum)
[facility] GIP RN Visit @ 1100 am  This is a related and covered GIP admission with a hospice diagnosis of cerebral atherosclerosis by Dr. Gibson RampFeldman.  Pt has an OOF DNR. [facility].  She was given several of her medications crushed, later began having abdominal pain and diarrhea, and eventually began vomiting dark blood as well as bright red blood.  [facility], after speaking with our on call RN.  [facility] reported that she had vomited something red, but that she was stable.  [facility] RN was sending a triage RN to see her, but advised if she became unstable to active EMS.  [facility] activated EMS.  Pt arrived in ED SBP in the 70's and tachycardic.    Carolyn Werner is admitted with a diagnosis of hematemesis.  This is GIP day 1.  Upon arrival to ED, pt received two units of PRBCs, and several NS boluses.  Visited pt at the bedside, daughter Carolyn Werner present.  Pt alert and oriented per her usual, states she feels better.  States last night her stomach hurt so bad and she did not think she would feel better.  Pt on room air, in no distress, denies pain.  Per daughter Carolyn Werner, pt looks much better-"last night she was gray".  D5NS infusing at 125 ml/hr, on room air, purewick in place-urine is dark but clear.    Goals of care:  Bowel rest, maintain comfort and discharge when ready back to home.  Discharge planning:  To return to home with [facility] services, as respite stay would have been complete.  Communication to [facility]:  [name] (dtr) at bedside.  Communication to [facility]:  Updated her [facility] SW, RN Synetta FailAnita, MD Dr. Ella JubileeArrien.  Transfer summary and medication list placed on shadow chart.  Please call with any related hospice questions,  Wallis BambergJennifer Woody BSN, RN [facility] Hospital Liaison [phone]

## 2018-02-16 NOTE — ED Notes (Signed)
Admitting doctor at the bedside 

## 2018-02-16 NOTE — Progress Notes (Signed)
[facility] MSW note: Patient is a current [facility] pt at home. Pt is a DNR. Pt lives at home with her daughter-Kathryn.  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 Pt was alert and oriented. Friend-Frenchy was sitting by pt's bedside as refers to herself as pt's "other daughter". Daughter-Kathryn is POA and HCPOA.. Daughter is currently out of town but is scheduled to arrive back early this evening. [facility]. Pt episodes of bloody vomit as request to come to the hospital along with daughter's request to be treated at the hospital. Pt reported that her stomach was hurting on visit. Synetta FailAnita, RN came in during visit and pt reported pain. RN agreed to follow up with pain medication with medication that was ordered. Pt's goal is to remain at the hospital until symptoms are managed and then return home with continued [facility] services. MSW offered active listening and emotional support. Please call with concerns or issues.   Elijio Milesebbie Garner, MSW [phone]

## 2018-02-17 DIAGNOSIS — Z8673 Personal history of transient ischemic attack (TIA), and cerebral infarction without residual deficits: Secondary | ICD-10-CM | POA: Diagnosis not present

## 2018-02-17 DIAGNOSIS — I129 Hypertensive chronic kidney disease with stage 1 through stage 4 chronic kidney disease, or unspecified chronic kidney disease: Secondary | ICD-10-CM | POA: Diagnosis present

## 2018-02-17 DIAGNOSIS — K920 Hematemesis: Secondary | ICD-10-CM | POA: Diagnosis present

## 2018-02-17 DIAGNOSIS — Z885 Allergy status to narcotic agent status: Secondary | ICD-10-CM | POA: Diagnosis not present

## 2018-02-17 DIAGNOSIS — Z89611 Acquired absence of right leg above knee: Secondary | ICD-10-CM | POA: Diagnosis not present

## 2018-02-17 DIAGNOSIS — E1122 Type 2 diabetes mellitus with diabetic chronic kidney disease: Secondary | ICD-10-CM | POA: Diagnosis present

## 2018-02-17 DIAGNOSIS — E785 Hyperlipidemia, unspecified: Secondary | ICD-10-CM | POA: Diagnosis present

## 2018-02-17 DIAGNOSIS — Z7982 Long term (current) use of aspirin: Secondary | ICD-10-CM | POA: Diagnosis not present

## 2018-02-17 DIAGNOSIS — E1151 Type 2 diabetes mellitus with diabetic peripheral angiopathy without gangrene: Secondary | ICD-10-CM | POA: Diagnosis present

## 2018-02-17 DIAGNOSIS — F329 Major depressive disorder, single episode, unspecified: Secondary | ICD-10-CM | POA: Diagnosis present

## 2018-02-17 DIAGNOSIS — Z888 Allergy status to other drugs, medicaments and biological substances status: Secondary | ICD-10-CM | POA: Diagnosis not present

## 2018-02-17 DIAGNOSIS — Z515 Encounter for palliative care: Secondary | ICD-10-CM | POA: Diagnosis present

## 2018-02-17 DIAGNOSIS — Z87891 Personal history of nicotine dependence: Secondary | ICD-10-CM | POA: Diagnosis not present

## 2018-02-17 DIAGNOSIS — Z886 Allergy status to analgesic agent status: Secondary | ICD-10-CM | POA: Diagnosis not present

## 2018-02-17 DIAGNOSIS — I959 Hypotension, unspecified: Secondary | ICD-10-CM | POA: Diagnosis present

## 2018-02-17 DIAGNOSIS — K922 Gastrointestinal hemorrhage, unspecified: Secondary | ICD-10-CM | POA: Diagnosis not present

## 2018-02-17 DIAGNOSIS — Z66 Do not resuscitate: Secondary | ICD-10-CM | POA: Diagnosis present

## 2018-02-17 DIAGNOSIS — Z881 Allergy status to other antibiotic agents status: Secondary | ICD-10-CM | POA: Diagnosis not present

## 2018-02-17 DIAGNOSIS — I69393 Ataxia following cerebral infarction: Secondary | ICD-10-CM

## 2018-02-17 DIAGNOSIS — J449 Chronic obstructive pulmonary disease, unspecified: Secondary | ICD-10-CM | POA: Diagnosis present

## 2018-02-17 DIAGNOSIS — N183 Chronic kidney disease, stage 3 (moderate): Secondary | ICD-10-CM | POA: Diagnosis present

## 2018-02-17 DIAGNOSIS — Z79899 Other long term (current) drug therapy: Secondary | ICD-10-CM | POA: Diagnosis not present

## 2018-02-17 DIAGNOSIS — Z880 Allergy status to penicillin: Secondary | ICD-10-CM | POA: Diagnosis not present

## 2018-02-17 DIAGNOSIS — Z7951 Long term (current) use of inhaled steroids: Secondary | ICD-10-CM | POA: Diagnosis not present

## 2018-02-17 DIAGNOSIS — Z882 Allergy status to sulfonamides status: Secondary | ICD-10-CM | POA: Diagnosis not present

## 2018-02-17 LAB — CBC WITH DIFFERENTIAL/PLATELET
Abs Immature Granulocytes: 0 10*3/uL (ref 0.0–0.1)
BASOS PCT: 1 %
Basophils Absolute: 0.1 10*3/uL (ref 0.0–0.1)
EOS ABS: 0.3 10*3/uL (ref 0.0–0.7)
EOS PCT: 2 %
HEMATOCRIT: 39.1 % (ref 36.0–46.0)
Hemoglobin: 12.9 g/dL (ref 12.0–15.0)
Immature Granulocytes: 0 %
LYMPHS ABS: 2.6 10*3/uL (ref 0.7–4.0)
Lymphocytes Relative: 22 %
MCH: 29.2 pg (ref 26.0–34.0)
MCHC: 33 g/dL (ref 30.0–36.0)
MCV: 88.5 fL (ref 78.0–100.0)
Monocytes Absolute: 0.8 10*3/uL (ref 0.1–1.0)
Monocytes Relative: 7 %
Neutro Abs: 8.1 10*3/uL — ABNORMAL HIGH (ref 1.7–7.7)
Neutrophils Relative %: 68 %
Platelets: 95 10*3/uL — ABNORMAL LOW (ref 150–400)
RBC: 4.42 MIL/uL (ref 3.87–5.11)
RDW: 13.5 % (ref 11.5–15.5)
WBC: 11.8 10*3/uL — AB (ref 4.0–10.5)

## 2018-02-17 LAB — BASIC METABOLIC PANEL
Anion gap: 6 (ref 5–15)
BUN: 26 mg/dL — ABNORMAL HIGH (ref 8–23)
CO2: 21 mmol/L — AB (ref 22–32)
CREATININE: 1.13 mg/dL — AB (ref 0.44–1.00)
Calcium: 9.1 mg/dL (ref 8.9–10.3)
Chloride: 111 mmol/L (ref 98–111)
GFR calc non Af Amer: 40 mL/min — ABNORMAL LOW (ref >60)
GFR, EST AFRICAN AMERICAN: 47 mL/min — AB (ref >60)
GLUCOSE: 110 mg/dL — AB (ref 70–99)
Potassium: 3.7 mmol/L (ref 3.5–5.1)
Sodium: 138 mmol/L (ref 135–145)

## 2018-02-17 MED ORDER — PANTOPRAZOLE SODIUM 40 MG PO TBEC: 40.0000 mg | DELAYED_RELEASE_TABLET | Freq: Every day | ORAL | 0 refills | Status: AC

## 2018-02-17 MED ORDER — SUCRALFATE 1 G PO TABS: 1.0000 g | ORAL_TABLET | Freq: Three times a day (TID) | ORAL | 0 refills | Status: AC

## 2018-02-17 NOTE — Progress Notes (Signed)
[facility] ([facility]) RN Visit at 0900 am  This is a related and covered GIP admission of 02/16/18 @ 0136 with [facility] diagnosis of cerebral atherosclerosis per Dr. Jamie BrookesMonguilod. Patient has OOF DNR. [facility] five night respite.  She was given several of her medications crushed, later began having abdominal pain and diarrhea, and eventually began vomiting dark blood as well as bright red blood.  EM[facility] by [facility], after speaking with our on call RN.  [facility] reported that she had vomited something red, but that she was stable.  [facility] RN was sending a triage RN to see her, but advised if she became unstable to active EMS.  [facility] activated EMS.  Pt arrived in ED SBP in the 70's and tachycardic.    [name] is admitted with a diagnosis of hematemesis.  This is [facility] Day 2 of GIP.  Visited with patient in room. Daughter Kerry HoughKathryn Addo is at bedside. She states Ms. Millman had a good night resting and she remains asleep now. She did arouse briefly and complained of right should pain, was repositioned by nurse tech and drifted back off to sleep.  Ms Mathis Budddo is quite concerned about patient's medication list here in hospital not reflecting [facility] medication list and usual administration she receives at home. We discussed each medication specifically as well as her dressing regimen. Provided active listening and support. Pieter PartridgeMiriam, bedside nurse is also aware and has paged Dr. Ella JubileeArrien. I spoke with Cala BradfordKimberly in pharmacy to review medication list and she states they were using a medication list prior to [facility]. Dr. Ella JubileeArrien was notified of daughter's concerns and that [facility] current medication list is on the shadow chart. He states he will be by shortly to review.  While charting Ms. Churchwell awakened and attempted to eat oatmeal. Daughter reports that she had difficulty and began coughing, which she is concerned will then  create an asthma flare since she has not had her nebulizer.  Continuous medications include dextrose 5%-0.9% sodium chloride infusion at 75 cc/hr. Tramadol 50 mg was administered x 1 02/16/18 at 1505. Pt is on room air and has purewick in place draining straw colored, clear urine.  Goals of care and Discharge Planning: Pt and daughter would like to return home when medically stable.   Communication with [facility] daughter Samara DeistKathryn at bedside.  Communication with [facility]G: per clinical note  Should pt require ambulance transport at discharge, please call [facility] at [phone] as [facility] for our patients.  [facility] will continue to follow while patient is in hospital.  Please call with any hospice related questions or concerns.  Thank you, Haynes Bastracy Ennis, RN, BSN [facility] Hospital Liaison [phone]  [facility] are on AMION.

## 2018-02-17 NOTE — Progress Notes (Signed)
PTAR Took pt vital signs and it was  o2 97 HR 81 BP 184/75 Retook and it was 181/74   MD notified awaiting call back. MD stated that it was okay for pt to go. If he gave her something it might drop it too low.

## 2018-02-17 NOTE — Care Management Note (Signed)
Case Management Note  Patient Details  Name: Carolyn IvoryJuanita Werner MRN: [id number] Date of Birth: 05/21/1922  Subjective/Objective:                    Action/Plan: Pt was active with [facility] prior to admission and is d/cing home with them following. Daughter requesting transport via ambulance home. [facility] uses [facility]. CM called and arranged pick up for 3 pm per daughter request. Bedside RN aware and in agreement.  CM notified French Anaracy with [facility] of the pick up time.   Expected Discharge Date:  02/17/18               Expected Discharge Plan:  Home w Hospice Care  In-House Referral:     Discharge planning Services  CM Consult  Post Acute Care Choice:  Hospice Choice offered to:  Patient, Adult Children  DME Arranged:    DME Agency:     HH Arranged:    HH Agency:  [facility]  Status of Service:  Completed, signed off  If discussed at Long Length of Stay Meetings, dates discussed:    Additional Comments:  Kermit BaloKelli F Mirranda Monrroy, RN 02/17/2018, 1:34 PM

## 2018-02-17 NOTE — Progress Notes (Signed)
RN gave discharge instructions to patients daughter. She stated understanding and did not have any questions. PTAR at bedside to get patient.

## 2018-02-17 NOTE — Discharge Summary (Addendum)
Physician Discharge Summary  Carolyn Werner:[id number] DOB: Sep 08, 1921 DOA: 02/15/2018  PCP: Lynn Ito, MD  Admit date: 02/15/2018 Discharge date: 02/17/2018  Admitted From: Home (hospice) Disposition:  Home (hospice)   Recommendations for Outpatient Follow-up and new medication changes:  1. Follow up with Katrinka Blazing in 7 days 2. Patient has been placed on sucralfate tid with meals for 14 days 3. Pantoprazole 40 mg daily 4. Discontinue aspirin.  5. Follow up with Hospice Services.  Note: This is a hospice patient, related and covered GIP admission.   Home Health: Na  Equipment/Devices: Na   Discharge Condition: stable  CODE STATUS: DNR   Diet recommendation: Dysphagia 2 with aspiration precautions.   Wound care: Clean and dry cover with Polymem, then gauge to left middle toe, every 5 to 7 days as needed.   Brief/Interim Summary: 84 year old female who presented with hematemesis.  She does have the significant past medical history for chronic kidney stage III, hypertension, type 2 diabetes mellitus, dyslipidemia, history of CVA and right above-the-knee amputation.  Currently under hospice services.  She was found to have a copious amounts of hematemesis, associated with nausea and vomiting.  On her initial physical examination her systolic blood pressure was down to 74/53 mmHg, temperature 98, respiratory rate 25, heart rate 109, oxygen saturation 97%. She was ill looking appearing, moist mucous membranes, lungs clear to auscultation bilaterally, heart S1-S2 present and rhythmic, the abdomen was soft and nontender, no lower extremity edema, positive right above-knee amputation.  Sodium 140, potassium 4.6, chloride 113, bicarb 21, glucose 141, BUN 37, creatinine 1.18, white count 13.1, hemoglobin 13.4, hematocrit 39.9, platelets 101.  Chest radiograph with left rotation, left hemidiaphragm elevation, no infiltrates.  EKG with normal sinus rhythm, normal axis normal intervals.  Patient  was admitted to the hospital with the working diagnosis of acute severe hematemesis, complicated by hypotension.  1.  Hematemesis complicated by hypotension.  Patient was admitted to the medical ward, received IV fluids and she was placed on a remote telemetry monitor.  Due to comorbidities patient favored a conservative approach and no invasive procedures were done.  Patient remained hemodynamically stable.  No signs of bleeding, her hemoglobin at discharge 12.9, hematocrit 39.1.  Patient was placed on pantoprazole and sucralfate with improvement of her dyspepsia.  Her diet was advanced with good toleration.  Aspirin has been discontinued.  2.  Chronic kidney disease stage III.  Her kidney function remained stable, she tolerated well IV fluids, discharge creatinine is 1.13, potassium 3.7 and serum bicarbonate 21.   3.  History of CVA.  Patient currently under hospice services, very deconditioned, aspirin has been discontinued.  Patient not on statins due to poor prognosis.  4.  COPD.  Remained stable with no signs of exacerbation continue levalbuterol.  5.  Depression.  No confusion or agitation, continue duloxetine.  Discharge Diagnoses:  Principal Problem:   Hematemesis Active Problems:   DM (diabetes mellitus), type 2 with peripheral vascular complications ([name])   Stage 3 chronic kidney disease ([name])   Benign essential HTN    Discharge Instructions   Allergies as of 02/17/2018      Reactions   Atacand Hct [candesartan Cilexetil-hctz] Other (See Comments)   Kidney disease   Carbamazepine Other (See Comments)   Changes in ability to read, think clearly, unsteady gait   Erythromycin Diarrhea   Glucophage [metformin Hcl] Other (See Comments)   Chronic low blood sugar   Lactose Intolerance (gi) Diarrhea, Nausea And Vomiting   Nausea &  Vomiting; Diarrhea   Lipitor [atorvastatin] Other (See Comments)   Muscle weakness   Nsaids Nausea And Vomiting   Motrin, Naprosyn, Voltaren,  Celebrex, Prevacid, Mobic -- abdominal pain and burning, cramps, loose stool   Penicillins Shortness Of Breath, Rash   Has patient had a PCN reaction causing immediate rash, facial/tongue/throat swelling, SOB or lightheadedness with hypotension: Yes Has patient had a PCN reaction causing severe rash involving mucus membranes or skin necrosis: No Has patient had a PCN reaction that required hospitalization: No Has patient had a PCN reaction occurring within the last 10 years: No If all of the above answers are "NO", then may proceed with Cephalosporin use.   Sulfa Antibiotics Anaphylaxis   Ceclor [cefaclor] Hives   Clindamycin/lincomycin Other (See Comments)   Abdominal pain, loss of appetite   Celebrex [celecoxib] Other (See Comments)   Listed on MAR   Milk-related Compounds Other (See Comments)   Bloating, abdominal discomfort.   Rosuvastatin Calcium Other (See Comments)   Lethargy, malaise   Dilaudid [hydromorphone Hcl] Rash   Plavix [clopidogrel] Rash, Other (See Comments)   Skin peeled, hair fell out      Medication List    STOP taking these medications   aspirin 81 MG EC tablet   doxycycline 100 MG capsule Commonly known as:  VIBRAMYCIN     TAKE these medications   acetaminophen 500 MG tablet Commonly known as:  TYLENOL Take 1 tablet (500 mg total) by mouth every 6 (six) hours as needed (pain).   ASPERCREME LIDOCAINE 4 % Ptch Generic drug:  Lidocaine Apply 1 patch topically daily. Right shoulder   DULoxetine 30 MG capsule Commonly known as:  CYMBALTA Take 30 mg by mouth daily.   Flaxseed Oil 1000 MG Caps Take 1,000 mg by mouth 3 (three) times daily.   gabapentin 100 MG capsule Commonly known as:  NEURONTIN Take 100-200 mg by mouth See admin instructions. Take 1 capsule in the morning and take 2 capsules at night   HYDROcodone-acetaminophen 5-325 MG tablet Commonly known as:  NORCO/VICODIN Take 0.5-1 tablets by mouth See admin instructions. Take 1/2 tablet by  mouth once daily at 2:PM. Take 1 tablet by mouth every 8 hours as needed for pain.   latanoprost 0.005 % ophthalmic solution Commonly known as:  XALATAN Place 1 drop into both eyes at bedtime.   levalbuterol 0.63 MG/3ML nebulizer solution Commonly known as:  XOPENEX Take 0.63 mg by nebulization 2 (two) times daily.   levalbuterol 45 MCG/ACT inhaler Commonly known as:  XOPENEX HFA Inhale 2 puffs into the lungs every 4 (four) hours as needed for wheezing.   loratadine 10 MG tablet Commonly known as:  CLARITIN Take 10 mg by mouth daily.   LORazepam 0.5 MG tablet Commonly known as:  ATIVAN Take 0.5 mg by mouth every 8 (eight) hours as needed for anxiety.   multivitamin with minerals Tabs tablet Take 1 tablet by mouth daily.   pantoprazole 40 MG tablet Commonly known as:  PROTONIX Take 1 tablet (40 mg total) by mouth daily.   senna-docusate 8.6-50 MG tablet Commonly known as:  Senokot-S Take 3 tablets by mouth 2 (two) times daily.   sorbitol 70 % solution Take 30 mLs by mouth daily as needed (If no BM x3 days).   sucralfate 1 g tablet Commonly known as:  CARAFATE Take 1 tablet (1 g total) by mouth 4 (four) times daily -  with meals and at bedtime for 14 days.   traMADol 50 MG  tablet Commonly known as:  ULTRAM Take 50 mg by mouth 2 (two) times daily as needed for moderate pain.       Allergies  Allergen Reactions  . Atacand Hct [Candesartan Cilexetil-Hctz] Other (See Comments)    Kidney disease  . Carbamazepine Other (See Comments)    Changes in ability to read, think clearly, unsteady gait  . Erythromycin Diarrhea  . Glucophage [Metformin Hcl] Other (See Comments)    Chronic low blood sugar   . Lactose Intolerance (Gi) Diarrhea and Nausea And Vomiting    Nausea & Vomiting; Diarrhea  . Lipitor [Atorvastatin] Other (See Comments)    Muscle weakness  . Nsaids Nausea And Vomiting    Motrin, Naprosyn, Voltaren, Celebrex, Prevacid, Mobic -- abdominal pain and  burning, cramps, loose stool  . Penicillins Shortness Of Breath and Rash    Has patient had a PCN reaction causing immediate rash, facial/tongue/throat swelling, SOB or lightheadedness with hypotension: Yes Has patient had a PCN reaction causing severe rash involving mucus membranes or skin necrosis: No Has patient had a PCN reaction that required hospitalization: No Has patient had a PCN reaction occurring within the last 10 years: No If all of the above answers are "NO", then may proceed with Cephalosporin use.  . Sulfa Antibiotics Anaphylaxis  . Ceclor [Cefaclor] Hives  . Clindamycin/Lincomycin Other (See Comments)    Abdominal pain, loss of appetite  . Celebrex [Celecoxib] Other (See Comments)    Listed on MAR  . Milk-Related Compounds Other (See Comments)    Bloating, abdominal discomfort.  . Rosuvastatin Calcium Other (See Comments)    Lethargy, malaise  . Dilaudid [Hydromorphone Hcl] Rash  . Plavix [Clopidogrel] Rash and Other (See Comments)    Skin peeled, hair fell out    Consultations:     Procedures/Studies: Dg Chest Portable 1 View  Result Date: 02/15/2018 CLINICAL DATA:  Shortness of breath EXAM: PORTABLE CHEST 1 VIEW COMPARISON:  09/12/2017 FINDINGS: Elevation of left diaphragm with subsegmental atelectasis. No pleural effusion. Normal heart size with aortic atherosclerosis. No pneumothorax. IMPRESSION: No active disease. Elevated left diaphragm with subsegmental atelectasis Electronically Signed   By: Jasmine Pang M.D.   On: 02/15/2018 21:38   Dg Foot Complete Left  Result Date: 02/08/2018 CLINICAL DATA:  LEFT foot and toe pain, wound, prior amputations EXAM: LEFT FOOT - COMPLETE 3+ VIEW COMPARISON:  None FINDINGS: Osseous demineralization. Prior amputation of first ray. Question bone destruction at the head of the proximal phalanx of the second toe extending into PIP joint, question osteomyelitis, unable to exclude involvement of the PIP joint. Remaining joint spaces  preserved. No fracture or dislocation. No additional bone destruction. Diffuse soft tissue swelling, greatest at second toe. IMPRESSION: Prior amputation of the first ray. Questionable bone destruction at the head of the proximal phalanx of the LEFT second toe which could reflect osteomyelitis, unable to exclude involvement at the PIP joint; this could be better assessed by MR. Electronically Signed   By: Ulyses Southward M.D.   On: 02/08/2018 17:59       Subjective: Patient with no further dyspepsia, positive right shoulder pain, no nausea or vomiting, no further hematemesis or melena.   Discharge Exam: Vitals:   02/17/18 0451 02/17/18 1007  BP: (!) 144/76   Pulse: 75   Resp: 16   Temp: 98.1 F (36.7 C)   SpO2: 98% 99%   Vitals:   02/16/18 1337 02/16/18 2023 02/17/18 0451 02/17/18 1007  BP: 121/73 (!) 149/74 (!) 144/76  Pulse: (!) 109 76 75   Resp: 16 20 16    Temp: 98.2 F (36.8 C) 98 F (36.7 C) 98.1 F (36.7 C)   TempSrc: Oral Oral Oral   SpO2: 99% 100% 98% 99%  Weight:      Height:        General: Not in pain or dyspnea.  Neurology: Awake and alert, non focal  E ENT: no pallor, no icterus, oral mucosa moist Cardiovascular: No JVD. S1-S2 present, rhythmic, no gallops, rubs, or murmurs. No lower extremity edema. Pulmonary: vesicular breath sounds bilaterally, adequate air movement, no wheezing, rhonchi or rales. Gastrointestinal. Abdomen with no organomegaly, non tender, no rebound or guarding Skin. No rashes Musculoskeletal: right BKA. Left foot 2nd toe with dressing in place.    The results of significant diagnostics from this hospitalization (including imaging, microbiology, ancillary and laboratory) are listed below for reference.     Microbiology: Recent Results (from the past 240 hour(s))  Blood culture (routine x 2)     Status: None   Collection Time: 02/08/18  5:25 PM  Result Value Ref Range Status   Specimen Description BLOOD LEFT ANTECUBITAL  Final    Special Requests   Final    BOTTLES DRAWN AEROBIC AND ANAEROBIC Blood Culture adequate volume   Culture   Final    NO GROWTH 5 DAYS Performed at [facility], [address], [place], [place] [address]    Report Status 02/13/2018 FINAL  Final     Labs: BNP (last 3 results) No results for input(s): BNP in the last 8760 hours. Basic Metabolic Panel: Recent Labs  Lab 02/15/18 2122 02/16/18 0754 02/17/18 0358  NA 137 140 138  K 4.8 4.6 3.7  CL 104 113* 111  CO2 23 21* 21*  GLUCOSE 208* 141* 110*  BUN 30* 37* 26*  CREATININE 1.48* 1.18* 1.13*  CALCIUM 9.6 8.8* 9.1   Liver Function Tests: Recent Labs  Lab 02/15/18 2122 02/16/18 0754  AST 22 18  ALT 13 11  ALKPHOS 78 66  BILITOT 1.0 1.1  PROT 5.7* 5.0*  ALBUMIN 3.0* 2.5*   Recent Labs  Lab 02/15/18 2122  LIPASE 21   No results for input(s): AMMONIA in the last 168 hours. CBC: Recent Labs  Lab 02/15/18 2122 02/16/18 0754 02/17/18 0358  WBC 13.0* 13.1* 11.8*  NEUTROABS  --   --  8.1*  HGB 11.4* 13.4 12.9  HCT 36.6 39.9 39.1  MCV 90.4 87.5 88.5  PLT 160 101* 95*   Cardiac Enzymes: No results for input(s): CKTOTAL, CKMB, CKMBINDEX, TROPONINI in the last 168 hours. BNP: Invalid input(s): POCBNP CBG: No results for input(s): GLUCAP in the last 168 hours. D-Dimer No results for input(s): DDIMER in the last 72 hours. Hgb A1c No results for input(s): HGBA1C in the last 72 hours. Lipid Profile No results for input(s): CHOL, HDL, LDLCALC, TRIG, CHOLHDL, LDLDIRECT in the last 72 hours. Thyroid function studies No results for input(s): TSH, T4TOTAL, T3FREE, THYROIDAB in the last 72 hours.  Invalid input(s): FREET3 Anemia work up No results for input(s): VITAMINB12, FOLATE, FERRITIN, TIBC, IRON, RETICCTPCT in the last 72 hours. Urinalysis    Component Value Date/Time   COLORURINE YELLOW 02/08/2018 2000   APPEARANCEUR CLEAR 02/08/2018 2000   LABSPEC 1.013 02/08/2018 2000   PHURINE 6.0 02/08/2018 2000    GLUCOSEU NEGATIVE 02/08/2018 2000   HGBUR NEGATIVE 02/08/2018 2000   BILIRUBINUR NEGATIVE 02/08/2018 2000   KETONESUR NEGATIVE 02/08/2018 2000   PROTEINUR  NEGATIVE 02/08/2018 2000   NITRITE NEGATIVE 02/08/2018 2000   LEUKOCYTESUR SMALL (A) 02/08/2018 2000   Sepsis Labs Invalid input(s): PROCALCITONIN,  WBC,  LACTICIDVEN Microbiology Recent Results (from the past 240 hour(s))  Blood culture (routine x 2)     Status: None   Collection Time: 02/08/18  5:25 PM  Result Value Ref Range Status   Specimen Description BLOOD LEFT ANTECUBITAL  Final   Special Requests   Final    BOTTLES DRAWN AEROBIC AND ANAEROBIC Blood Culture adequate volume   Culture   Final    NO GROWTH 5 DAYS Performed at [facility], [address] [place], [place] [address]    Report Status 02/13/2018 FINAL  Final     Time coordinating discharge: 45 minutes  SIGNED:   Coralie Keens, MD  [facility] 02/17/2018, 10:37 AM Pager [phone]  If 7PM-7AM, please contact night-coverage [email or web address] Password [email or web address]

## 2018-02-18 LAB — TYPE AND SCREEN
ABO/RH(D): A POS
Antibody Screen: NEGATIVE
UNIT DIVISION: 0
Unit division: 0
Unit division: 0
Unit division: 0

## 2018-02-18 LAB — BPAM RBC
BLOOD PRODUCT EXPIRATION DATE: 201910072359
BLOOD PRODUCT EXPIRATION DATE: 201910142359
Blood Product Expiration Date: 201910082359
Blood Product Expiration Date: 201910142359
ISSUE DATE / TIME: 201909132305
ISSUE DATE / TIME: 201909132341
ISSUE DATE / TIME: 201909122024
ISSUE DATE / TIME: 201909150257
UNIT TYPE AND RH: 6200
UNIT TYPE AND RH: 9500
Unit Type and Rh: 6200
Unit Type and Rh: 9500

## 2018-02-18 LAB — GLUCOSE, CAPILLARY: Glucose-Capillary: 129 mg/dL — ABNORMAL HIGH (ref 70–99)

## 2018-05-05 DEATH — deceased
